# Patient Record
Sex: Male | Born: 1960 | Race: White | Hispanic: No | Marital: Married | Smoking: Former smoker
Health system: Southern US, Community
[De-identification: ages and names within clinical notes are randomized; demographics above are authoritative.]

## PROBLEM LIST (undated history)

## (undated) DIAGNOSIS — I255 Ischemic cardiomyopathy: Secondary | ICD-10-CM

## (undated) DIAGNOSIS — D696 Thrombocytopenia, unspecified: Secondary | ICD-10-CM

## (undated) DIAGNOSIS — E119 Type 2 diabetes mellitus without complications: Secondary | ICD-10-CM

## (undated) DIAGNOSIS — I219 Acute myocardial infarction, unspecified: Secondary | ICD-10-CM

## (undated) DIAGNOSIS — K760 Fatty (change of) liver, not elsewhere classified: Secondary | ICD-10-CM

## (undated) DIAGNOSIS — G4733 Obstructive sleep apnea (adult) (pediatric): Secondary | ICD-10-CM

## (undated) DIAGNOSIS — I10 Essential (primary) hypertension: Secondary | ICD-10-CM

## (undated) DIAGNOSIS — I2510 Atherosclerotic heart disease of native coronary artery without angina pectoris: Secondary | ICD-10-CM

## (undated) DIAGNOSIS — E785 Hyperlipidemia, unspecified: Secondary | ICD-10-CM

## (undated) HISTORY — PX: OTHER SURGICAL HISTORY: SHX169

## (undated) HISTORY — DX: Acute myocardial infarction, unspecified: I21.9

## (undated) HISTORY — PX: WISDOM TOOTH EXTRACTION: SHX21

## (undated) HISTORY — DX: Atherosclerotic heart disease of native coronary artery without angina pectoris: I25.10

## (undated) HISTORY — DX: Fatty (change of) liver, not elsewhere classified: K76.0

## (undated) HISTORY — DX: Obstructive sleep apnea (adult) (pediatric): G47.33

## (undated) HISTORY — DX: Hyperlipidemia, unspecified: E78.5

## (undated) HISTORY — PX: CORONARY ANGIOPLASTY WITH STENT PLACEMENT: SHX49

## (undated) HISTORY — PX: ROTATOR CUFF REPAIR: SHX139

---

## 1998-08-24 ENCOUNTER — Encounter: Payer: Self-pay | Admitting: Emergency Medicine

## 1998-08-24 ENCOUNTER — Emergency Department (HOSPITAL_COMMUNITY): Admission: EM | Admit: 1998-08-24 | Discharge: 1998-08-24 | Payer: Self-pay | Admitting: Emergency Medicine

## 2000-09-09 ENCOUNTER — Inpatient Hospital Stay (HOSPITAL_COMMUNITY): Admission: EM | Admit: 2000-09-09 | Discharge: 2000-09-13 | Payer: Self-pay | Admitting: Emergency Medicine

## 2000-09-09 ENCOUNTER — Encounter: Payer: Self-pay | Admitting: Emergency Medicine

## 2000-10-03 ENCOUNTER — Encounter (HOSPITAL_COMMUNITY): Admission: RE | Admit: 2000-10-03 | Discharge: 2000-12-21 | Payer: Self-pay | Admitting: *Deleted

## 2000-10-15 ENCOUNTER — Encounter: Payer: Self-pay | Admitting: Emergency Medicine

## 2000-10-15 ENCOUNTER — Inpatient Hospital Stay (HOSPITAL_COMMUNITY): Admission: EM | Admit: 2000-10-15 | Discharge: 2000-10-18 | Payer: Self-pay | Admitting: Emergency Medicine

## 2001-01-14 ENCOUNTER — Inpatient Hospital Stay (HOSPITAL_COMMUNITY): Admission: EM | Admit: 2001-01-14 | Discharge: 2001-01-18 | Payer: Self-pay | Admitting: Emergency Medicine

## 2001-01-14 ENCOUNTER — Encounter: Payer: Self-pay | Admitting: *Deleted

## 2001-05-08 ENCOUNTER — Ambulatory Visit (HOSPITAL_COMMUNITY): Admission: RE | Admit: 2001-05-08 | Discharge: 2001-05-08 | Payer: Self-pay | Admitting: *Deleted

## 2001-10-04 ENCOUNTER — Encounter: Payer: Self-pay | Admitting: *Deleted

## 2001-10-04 ENCOUNTER — Ambulatory Visit (HOSPITAL_COMMUNITY): Admission: RE | Admit: 2001-10-04 | Discharge: 2001-10-04 | Payer: Self-pay | Admitting: *Deleted

## 2002-01-10 ENCOUNTER — Encounter: Admission: RE | Admit: 2002-01-10 | Discharge: 2002-01-10 | Payer: Self-pay | Admitting: Gastroenterology

## 2002-01-10 ENCOUNTER — Encounter: Payer: Self-pay | Admitting: Gastroenterology

## 2002-02-13 ENCOUNTER — Inpatient Hospital Stay (HOSPITAL_COMMUNITY): Admission: AD | Admit: 2002-02-13 | Discharge: 2002-02-14 | Payer: Self-pay | Admitting: *Deleted

## 2002-02-13 ENCOUNTER — Encounter: Payer: Self-pay | Admitting: *Deleted

## 2003-02-03 ENCOUNTER — Inpatient Hospital Stay (HOSPITAL_COMMUNITY): Admission: AD | Admit: 2003-02-03 | Discharge: 2003-02-05 | Payer: Self-pay | Admitting: Internal Medicine

## 2003-02-04 ENCOUNTER — Encounter: Payer: Self-pay | Admitting: Internal Medicine

## 2003-03-20 ENCOUNTER — Emergency Department (HOSPITAL_COMMUNITY): Admission: EM | Admit: 2003-03-20 | Discharge: 2003-03-20 | Payer: Self-pay | Admitting: Emergency Medicine

## 2004-12-02 ENCOUNTER — Ambulatory Visit: Payer: Self-pay | Admitting: Internal Medicine

## 2004-12-06 ENCOUNTER — Ambulatory Visit: Payer: Self-pay

## 2004-12-14 ENCOUNTER — Ambulatory Visit: Payer: Self-pay | Admitting: *Deleted

## 2005-01-10 ENCOUNTER — Ambulatory Visit: Payer: Self-pay | Admitting: *Deleted

## 2005-03-07 ENCOUNTER — Ambulatory Visit (HOSPITAL_COMMUNITY): Admission: RE | Admit: 2005-03-07 | Discharge: 2005-03-07 | Payer: Self-pay | Admitting: Family Medicine

## 2005-03-17 ENCOUNTER — Ambulatory Visit: Payer: Self-pay | Admitting: Cardiology

## 2005-03-25 ENCOUNTER — Inpatient Hospital Stay (HOSPITAL_BASED_OUTPATIENT_CLINIC_OR_DEPARTMENT_OTHER): Admission: RE | Admit: 2005-03-25 | Discharge: 2005-03-25 | Payer: Self-pay | Admitting: *Deleted

## 2005-03-25 ENCOUNTER — Ambulatory Visit: Payer: Self-pay | Admitting: *Deleted

## 2005-03-25 ENCOUNTER — Observation Stay (HOSPITAL_COMMUNITY): Admission: AD | Admit: 2005-03-25 | Discharge: 2005-03-26 | Payer: Self-pay | Admitting: *Deleted

## 2005-04-01 ENCOUNTER — Ambulatory Visit: Payer: Self-pay | Admitting: Cardiology

## 2005-04-08 ENCOUNTER — Ambulatory Visit: Payer: Self-pay | Admitting: *Deleted

## 2005-05-02 ENCOUNTER — Ambulatory Visit (HOSPITAL_COMMUNITY): Admission: RE | Admit: 2005-05-02 | Discharge: 2005-05-02 | Payer: Self-pay | Admitting: Family Medicine

## 2005-08-05 ENCOUNTER — Ambulatory Visit: Payer: Self-pay | Admitting: Cardiology

## 2005-08-24 ENCOUNTER — Ambulatory Visit: Payer: Self-pay | Admitting: Cardiology

## 2005-08-30 ENCOUNTER — Ambulatory Visit: Payer: Self-pay

## 2005-11-24 ENCOUNTER — Ambulatory Visit: Payer: Self-pay | Admitting: Cardiology

## 2005-12-23 ENCOUNTER — Ambulatory Visit: Payer: Self-pay | Admitting: Internal Medicine

## 2006-01-27 ENCOUNTER — Ambulatory Visit (HOSPITAL_BASED_OUTPATIENT_CLINIC_OR_DEPARTMENT_OTHER): Admission: RE | Admit: 2006-01-27 | Discharge: 2006-01-27 | Payer: Self-pay | Admitting: Internal Medicine

## 2006-01-29 ENCOUNTER — Ambulatory Visit: Payer: Self-pay | Admitting: Internal Medicine

## 2006-02-02 ENCOUNTER — Ambulatory Visit: Payer: Self-pay | Admitting: Internal Medicine

## 2006-02-27 ENCOUNTER — Ambulatory Visit: Payer: Self-pay | Admitting: Cardiology

## 2006-03-03 ENCOUNTER — Ambulatory Visit: Payer: Self-pay | Admitting: Cardiology

## 2006-03-03 ENCOUNTER — Ambulatory Visit (HOSPITAL_COMMUNITY): Admission: RE | Admit: 2006-03-03 | Discharge: 2006-03-04 | Payer: Self-pay | Admitting: Cardiology

## 2006-03-03 ENCOUNTER — Encounter: Payer: Self-pay | Admitting: Internal Medicine

## 2006-03-16 ENCOUNTER — Ambulatory Visit: Payer: Self-pay | Admitting: Cardiology

## 2006-07-03 ENCOUNTER — Ambulatory Visit: Payer: Self-pay | Admitting: Cardiology

## 2007-01-31 ENCOUNTER — Ambulatory Visit: Payer: Self-pay | Admitting: Cardiology

## 2007-07-11 ENCOUNTER — Ambulatory Visit: Payer: Self-pay | Admitting: Cardiology

## 2007-07-11 LAB — CONVERTED CEMR LAB
BUN: 15 mg/dL (ref 6–23)
Basophils Absolute: 0 10*3/uL (ref 0.0–0.1)
Basophils Relative: 0.7 % (ref 0.0–1.0)
CO2: 30 meq/L (ref 19–32)
Calcium: 9.6 mg/dL (ref 8.4–10.5)
Chloride: 100 meq/L (ref 96–112)
Creatinine, Ser: 1.3 mg/dL (ref 0.4–1.5)
Eosinophils Absolute: 0.2 10*3/uL (ref 0.0–0.6)
Eosinophils Relative: 2.6 % (ref 0.0–5.0)
GFR calc Af Amer: 77 mL/min
GFR calc non Af Amer: 64 mL/min
Glucose, Bld: 91 mg/dL (ref 70–99)
HCT: 41.9 % (ref 39.0–52.0)
Hemoglobin: 15.1 g/dL (ref 13.0–17.0)
INR: 0.9 (ref 0.8–1.0)
Lymphocytes Relative: 30.3 % (ref 12.0–46.0)
MCHC: 36 g/dL (ref 30.0–36.0)
MCV: 89.2 fL (ref 78.0–100.0)
Monocytes Absolute: 0.5 10*3/uL (ref 0.2–0.7)
Monocytes Relative: 7.2 % (ref 3.0–11.0)
Neutro Abs: 4.2 10*3/uL (ref 1.4–7.7)
Neutrophils Relative %: 59.2 % (ref 43.0–77.0)
Platelets: 190 10*3/uL (ref 150–400)
Potassium: 3.6 meq/L (ref 3.5–5.1)
Prothrombin Time: 11.4 s (ref 10.9–13.3)
RBC: 4.7 M/uL (ref 4.22–5.81)
RDW: 13.3 % (ref 11.5–14.6)
Sodium: 140 meq/L (ref 135–145)
WBC: 7 10*3/uL (ref 4.5–10.5)
aPTT: 24.9 s (ref 21.7–29.8)

## 2007-07-20 ENCOUNTER — Ambulatory Visit: Payer: Self-pay | Admitting: Cardiovascular Disease

## 2007-07-20 ENCOUNTER — Inpatient Hospital Stay (HOSPITAL_BASED_OUTPATIENT_CLINIC_OR_DEPARTMENT_OTHER): Admission: RE | Admit: 2007-07-20 | Discharge: 2007-07-20 | Payer: Self-pay | Admitting: Cardiovascular Disease

## 2007-07-20 ENCOUNTER — Inpatient Hospital Stay (HOSPITAL_COMMUNITY): Admission: AD | Admit: 2007-07-20 | Discharge: 2007-07-21 | Payer: Self-pay | Admitting: Cardiovascular Disease

## 2007-07-20 ENCOUNTER — Ambulatory Visit: Payer: Self-pay | Admitting: Cardiology

## 2007-08-03 ENCOUNTER — Ambulatory Visit: Payer: Self-pay | Admitting: Cardiology

## 2007-08-03 LAB — CONVERTED CEMR LAB
ALT: 33 units/L (ref 0–53)
AST: 24 units/L (ref 0–37)
Albumin: 4.3 g/dL (ref 3.5–5.2)
Alkaline Phosphatase: 54 units/L (ref 39–117)
Bilirubin, Direct: 0.1 mg/dL (ref 0.0–0.3)
Cholesterol: 162 mg/dL (ref 0–200)
Direct LDL: 105.6 mg/dL
HDL: 26.5 mg/dL — ABNORMAL LOW (ref >39.0)
Total Bilirubin: 1 mg/dL (ref 0.3–1.2)
Total CHOL/HDL Ratio: 6.1
Total Protein: 7.4 g/dL (ref 6.0–8.3)
Triglycerides: 234 mg/dL (ref 0–149)
VLDL: 47 mg/dL — ABNORMAL HIGH (ref 0–40)

## 2007-08-23 ENCOUNTER — Ambulatory Visit (HOSPITAL_COMMUNITY): Admission: RE | Admit: 2007-08-23 | Discharge: 2007-08-24 | Payer: Self-pay | Admitting: Orthopedic Surgery

## 2007-10-22 ENCOUNTER — Ambulatory Visit: Payer: Self-pay | Admitting: Cardiology

## 2007-10-22 LAB — CONVERTED CEMR LAB
ALT: 45 units/L (ref 0–53)
AST: 21 units/L (ref 0–37)
Albumin: 4 g/dL (ref 3.5–5.2)
Alkaline Phosphatase: 60 units/L (ref 39–117)
Bilirubin, Direct: 0.1 mg/dL (ref 0.0–0.3)
Cholesterol: 239 mg/dL (ref 0–200)
Direct LDL: 95.5 mg/dL
HDL: 26.3 mg/dL — ABNORMAL LOW (ref >39.0)
Total Bilirubin: 0.8 mg/dL (ref 0.3–1.2)
Total CHOL/HDL Ratio: 9.1
Total Protein: 6.9 g/dL (ref 6.0–8.3)
Triglycerides: 838 mg/dL (ref 0–149)
VLDL: 168 mg/dL — ABNORMAL HIGH (ref 0–40)

## 2007-10-23 ENCOUNTER — Ambulatory Visit: Payer: Self-pay | Admitting: Cardiology

## 2008-05-19 ENCOUNTER — Ambulatory Visit: Payer: Self-pay | Admitting: Cardiology

## 2008-12-03 ENCOUNTER — Ambulatory Visit: Payer: Self-pay | Admitting: Cardiology

## 2008-12-08 ENCOUNTER — Ambulatory Visit (HOSPITAL_COMMUNITY): Admission: RE | Admit: 2008-12-08 | Discharge: 2008-12-08 | Payer: Self-pay | Admitting: Internal Medicine

## 2009-01-08 ENCOUNTER — Ambulatory Visit (HOSPITAL_COMMUNITY): Admission: RE | Admit: 2009-01-08 | Discharge: 2009-01-08 | Payer: Self-pay | Admitting: Urology

## 2009-04-27 ENCOUNTER — Encounter (INDEPENDENT_AMBULATORY_CARE_PROVIDER_SITE_OTHER): Payer: Self-pay | Admitting: *Deleted

## 2009-04-27 LAB — CONVERTED CEMR LAB
ALT: 37 units/L
AST: 23 units/L
Albumin: 4.2 g/dL
Alkaline Phosphatase: 49 units/L
Bilirubin, Direct: <0.1 mg/dL
Cholesterol: 153 mg/dL
HDL: 37 mg/dL
LDL Cholesterol: 82 mg/dL
Total Protein: 6.6 g/dL
Triglycerides: 171 mg/dL

## 2009-04-30 ENCOUNTER — Encounter: Payer: Self-pay | Admitting: Cardiology

## 2009-08-03 ENCOUNTER — Encounter: Payer: Self-pay | Admitting: Cardiology

## 2009-08-25 ENCOUNTER — Telehealth (INDEPENDENT_AMBULATORY_CARE_PROVIDER_SITE_OTHER): Payer: Self-pay | Admitting: *Deleted

## 2009-08-31 DIAGNOSIS — E785 Hyperlipidemia, unspecified: Secondary | ICD-10-CM | POA: Insufficient documentation

## 2009-09-01 ENCOUNTER — Ambulatory Visit: Payer: Self-pay | Admitting: Cardiology

## 2009-09-01 DIAGNOSIS — I209 Angina pectoris, unspecified: Secondary | ICD-10-CM | POA: Insufficient documentation

## 2009-09-01 DIAGNOSIS — I2510 Atherosclerotic heart disease of native coronary artery without angina pectoris: Secondary | ICD-10-CM | POA: Insufficient documentation

## 2009-09-22 ENCOUNTER — Encounter (INDEPENDENT_AMBULATORY_CARE_PROVIDER_SITE_OTHER): Payer: Self-pay | Admitting: *Deleted

## 2009-09-22 ENCOUNTER — Ambulatory Visit: Payer: Self-pay | Admitting: Cardiology

## 2009-09-22 LAB — CONVERTED CEMR LAB
ALT: 37 units/L
AST: 23 units/L
Albumin: 4.2 g/dL
Alkaline Phosphatase: 49 units/L
Bilirubin, Direct: <0.1 mg/dL
Cholesterol: 153 mg/dL
HDL: 37 mg/dL
LDL Cholesterol: 82 mg/dL
Total Protein: 6.6 g/dL
Triglycerides: 171 mg/dL

## 2009-10-27 ENCOUNTER — Telehealth (INDEPENDENT_AMBULATORY_CARE_PROVIDER_SITE_OTHER): Payer: Self-pay

## 2009-12-21 ENCOUNTER — Encounter (INDEPENDENT_AMBULATORY_CARE_PROVIDER_SITE_OTHER): Payer: Self-pay | Admitting: *Deleted

## 2009-12-31 ENCOUNTER — Ambulatory Visit: Payer: Self-pay | Admitting: Cardiology

## 2010-01-29 ENCOUNTER — Ambulatory Visit: Payer: Self-pay | Admitting: Cardiology

## 2010-01-29 ENCOUNTER — Telehealth (INDEPENDENT_AMBULATORY_CARE_PROVIDER_SITE_OTHER): Payer: Self-pay

## 2010-01-29 ENCOUNTER — Encounter (INDEPENDENT_AMBULATORY_CARE_PROVIDER_SITE_OTHER): Payer: Self-pay | Admitting: *Deleted

## 2010-01-29 DIAGNOSIS — I10 Essential (primary) hypertension: Secondary | ICD-10-CM | POA: Insufficient documentation

## 2010-02-01 ENCOUNTER — Ambulatory Visit: Payer: Self-pay | Admitting: Cardiovascular Disease

## 2010-02-01 ENCOUNTER — Inpatient Hospital Stay (HOSPITAL_BASED_OUTPATIENT_CLINIC_OR_DEPARTMENT_OTHER): Admission: RE | Admit: 2010-02-01 | Discharge: 2010-02-01 | Payer: Self-pay | Admitting: Cardiovascular Disease

## 2010-02-01 ENCOUNTER — Inpatient Hospital Stay (HOSPITAL_COMMUNITY): Admission: AD | Admit: 2010-02-01 | Discharge: 2010-02-03 | Payer: Self-pay | Admitting: Cardiovascular Disease

## 2010-02-03 ENCOUNTER — Telehealth: Payer: Self-pay | Admitting: Cardiovascular Disease

## 2010-02-24 ENCOUNTER — Encounter (INDEPENDENT_AMBULATORY_CARE_PROVIDER_SITE_OTHER): Payer: Self-pay | Admitting: *Deleted

## 2010-02-24 ENCOUNTER — Ambulatory Visit: Payer: Self-pay | Admitting: Cardiology

## 2010-02-24 LAB — CONVERTED CEMR LAB
BUN: 17 mg/dL
BUN: 17 mg/dL (ref 6–23)
CO2: 30 meq/L
CO2: 30 meq/L (ref 19–32)
Calcium: 9.4 mg/dL
Calcium: 9.4 mg/dL (ref 8.4–10.5)
Chloride: 99 meq/L
Chloride: 99 meq/L (ref 96–112)
Creatinine, Ser: 1.03 mg/dL
Creatinine, Ser: 1.03 mg/dL (ref 0.40–1.50)
Glucose, Bld: 79 mg/dL
Glucose, Bld: 79 mg/dL (ref 70–99)
Potassium: 3.8 meq/L
Potassium: 3.8 meq/L (ref 3.5–5.3)
Sodium: 135 meq/L
Sodium: 135 meq/L (ref 135–145)

## 2010-03-09 ENCOUNTER — Telehealth (INDEPENDENT_AMBULATORY_CARE_PROVIDER_SITE_OTHER): Payer: Self-pay

## 2010-04-21 ENCOUNTER — Encounter (INDEPENDENT_AMBULATORY_CARE_PROVIDER_SITE_OTHER): Payer: Self-pay | Admitting: *Deleted

## 2010-04-28 ENCOUNTER — Encounter (INDEPENDENT_AMBULATORY_CARE_PROVIDER_SITE_OTHER): Payer: Self-pay | Admitting: *Deleted

## 2010-04-28 ENCOUNTER — Telehealth (INDEPENDENT_AMBULATORY_CARE_PROVIDER_SITE_OTHER): Payer: Self-pay

## 2010-04-30 ENCOUNTER — Telehealth (INDEPENDENT_AMBULATORY_CARE_PROVIDER_SITE_OTHER): Payer: Self-pay

## 2010-05-07 ENCOUNTER — Encounter: Payer: Self-pay | Admitting: Cardiology

## 2010-05-07 ENCOUNTER — Ambulatory Visit: Payer: Self-pay | Admitting: Cardiology

## 2010-05-07 ENCOUNTER — Ambulatory Visit (HOSPITAL_COMMUNITY): Admission: RE | Admit: 2010-05-07 | Discharge: 2010-05-07 | Payer: Self-pay | Admitting: Cardiology

## 2010-05-17 ENCOUNTER — Encounter: Payer: Self-pay | Admitting: Cardiology

## 2010-06-03 ENCOUNTER — Encounter: Payer: Self-pay | Admitting: Cardiology

## 2010-07-15 ENCOUNTER — Encounter (INDEPENDENT_AMBULATORY_CARE_PROVIDER_SITE_OTHER): Payer: Self-pay

## 2010-07-15 LAB — CONVERTED CEMR LAB
Cholesterol: 149 mg/dL
HDL: 42 mg/dL
LDL Cholesterol: 83 mg/dL
Triglycerides: 120 mg/dL

## 2010-07-20 ENCOUNTER — Telehealth (INDEPENDENT_AMBULATORY_CARE_PROVIDER_SITE_OTHER): Payer: Self-pay | Admitting: *Deleted

## 2010-08-13 ENCOUNTER — Encounter (INDEPENDENT_AMBULATORY_CARE_PROVIDER_SITE_OTHER): Payer: Self-pay

## 2010-08-13 ENCOUNTER — Telehealth (INDEPENDENT_AMBULATORY_CARE_PROVIDER_SITE_OTHER): Payer: Self-pay

## 2010-08-13 ENCOUNTER — Ambulatory Visit: Payer: Self-pay | Admitting: Cardiology

## 2010-12-12 ENCOUNTER — Encounter: Payer: Self-pay | Admitting: Family Medicine

## 2010-12-23 NOTE — Assessment & Plan Note (Signed)
Summary: PT HAVING SOME CHEST PAIN/PER TAMMY/TG   Visit Type:  Follow-up Primary Provider:  Dr. [name]   History of Present Illness: 50 year old gentleman, last seen in the office in January. He presents for a routine followup, although reports a 4-6 week history of exertional angina and shortness of breath. He states that he was able to lose weight, down to 215 pounds with diet started earlier in the year, although has gained back up to 251 pounds. He continues to go to the gym, and with relatively low level exercise, does not have angina. Walking up a flight of steps quickly, however consistently brings on chest pain. Symptoms last for usually only a few minutes, and never at rest. He has not had use any sublingual nitroglycerin.  He and his wife went on a seven-day cruise to the [place] recently, and he was experiencing angina during this trip.  He has otherwise been compliant with his medications. He has had prior problems with an intolerance to long-acting nitrates related to headache.  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 Following drug-eluting stent placement back in 2008, he has done reasonably well to this point.  Current Medications (verified): 1)  Crestor 20 Mg Tabs (Rosuvastatin Calcium) .... Take 1 Tab At Bedtime 2)  Nexium 40 Mg Cpdr (Esomeprazole Magnesium) 3)  Plavix 75 Mg Tabs (Clopidogrel Bisulfate) 4)  Toprol Xl 50 Mg Xr24h-Tab (Metoprolol Succinate) .... Take 1 and 1/2 Tablets (75mg ) By Mouth Two Times A Day 5)  Aspirin 325 Mg Tabs (Aspirin) .... Take 1 Tab Daily 6)  Norvasc 2.5 Mg Tabs (Amlodipine Besylate) .... Take 1 Tablet By Mouth Once Daily 7)  Nitrostat 0.4 Mg Subl (Nitroglycerin) .[name].. 1 Tablet Under Tongue At Onset of Chest Pain; You May Repeat Every 5 Minutes For Up To 3 Doses. 8)  Diovan Hct 320-25 Mg Tabs  (Valsartan-Hydrochlorothiazide) .... Take 1 Tablet By Mouth Once Daily  Allergies (verified): No Known Drug Allergies  Past History:  Social History: Last updated: 08/31/2009 Married  Tobacco Use - No.  Alcohol Use - no Regular Exercise - no Drug Use - no  Past Medical History: CAD - remote stent LAD, subsequent stents RCA with restenosis, DES to RCA and circ 06/2007 in setting of restenosis Hyperlipidemia Hypertension Hepatic steatosis Obstructive sleep apnea Myocardial Infarction - anterior  Past Surgical History: Unremarkable  Family History: Family History of Coronary Artery Disease  Review of Systems       The patient complains of weight gain.  The patient denies anorexia, fever, hoarseness, syncope, peripheral edema, prolonged cough, hemoptysis, abdominal pain, melena, and hematochezia.         Otherwise reviewed and negative.  Vital Signs:  Patient profile:   50 year old male Height:      73 inches Weight:      251 pounds BMI:     33.24 Pulse rate:   84 / minute BP sitting:   133 / 82  (right arm)  Vitals Entered By: Dreama Saa, CNA (September 01, 2009 3:37 PM)  Physical Exam  Additional Exam:  Morbidly obese male in no acute distress, no active chest pain. HEENT: Conjunctiva and lids normal, oropharynx clear. Neck: Supple, no elevated jugular venous pressure. Lungs: Clear to auscultation, nonlabored. Cardiac: Regular rate and rhythm, no S3 gallop, no loud systolic murmur. PMI indistinct. Abdomen: Obese, cannot palpate liver edge, bowel sounds present, nontender. Skin: Warm and dry.  Extremities: No pitting edema, distal pulses one plus. Musculoskeletal: No kyphosis. Neuropsychiatric: Alert and oriented x3, affect appropriate   EKG  Procedure date:  09/01/2009  Findings:      Normal sinus rhythm at 77 beats per minute. No acute ST changes.  Impression & Recommendations:  Problem # 1:  ANGINA, STABLE (ICD-413.9)  [name] is describing  exertional angina, onset over the last 4-6 weeks, although stable in pattern and intensity. Based on his history of recurrent restenosis, predominantly affecting the right coronary artery, I am certainly suspicious about either restenosis versus progressive atherosclerosis. On the other hand, he has gained a significant amount of weight which may be contributing to increased myocardial demand. He reports compliance with his medications otherwise. I reviewed the matter with him, and we discussed a repeat cardiac catheterization, following an initial attempt at advancing antianginal therapy. I will add Norvasc 2.5 mg daily to his present regimen. We discussed weight loss. I plan to see him back over the next 3 weeks to check on his clinical progress. If his symptoms progress, a cardiac catheterization will be scheduled to best understand his coronary anatomy, and potential for revascularization options.  His updated medication list for this problem includes:    Plavix 75 Mg Tabs (Clopidogrel bisulfate)    Toprol Xl 50 Mg Xr24h-tab (Metoprolol succinate) .[name]... Take 1 and 1/2 tablets (75mg ) by mouth two times a day    Aspirin 325 Mg Tabs (Aspirin) .[name]... Take 1 tab daily    Norvasc 2.5 Mg Tabs (Amlodipine besylate) .[name]... Take 1 tablet by mouth once daily    Nitrostat 0.4 Mg Subl (Nitroglycerin) .[name]... 1 tablet under tongue at onset of chest pain; you may repeat every 5 minutes for up to 3 doses.  His updated medication list for this problem includes:    Plavix 75 Mg Tabs (Clopidogrel bisulfate)    Toprol Xl 50 Mg Xr24h-tab (Metoprolol succinate) .[name]... Take 1 and 1/2 tablets (75mg ) by mouth two times a day    Aspirin 325 Mg Tabs (Aspirin) .[name]... Take 1 tab daily    Norvasc 2.5 Mg Tabs (Amlodipine besylate) .[name]... Take 1 tablet by mouth once daily    Nitrostat 0.4 Mg Subl (Nitroglycerin) .[name]... 1 tablet under tongue at onset of chest pain; you may repeat every 5 minutes for up to 3 doses.  Problem # 2:   CORONARY ATHEROSCLEROSIS NATIVE CORONARY ARTERY (ICD-414.01)  Status post multiple percutaneous coronary interventions, mostly related to restenosis within the right coronary artery distribution. Drug-eluting stents were placed in the right coronary artery and circumflex back in 2008. If he continues to manifest restenosis, particularly in the setting of advancing atherosclerosis elsewhere, bypass surgery may well be his best option for a more durable revascularization over time.  His updated medication list for this problem includes:    Plavix 75 Mg Tabs (Clopidogrel bisulfate)    Toprol Xl 50 Mg Xr24h-tab (Metoprolol succinate) .[name]... Take 1 and 1/2 tablets (75mg ) by mouth two times a day    Aspirin 325 Mg Tabs (Aspirin) .[name]... Take 1 tab daily    Norvasc 2.5 Mg Tabs (Amlodipine besylate) .[name]... Take 1 tablet by mouth once daily    Nitrostat 0.4 Mg Subl (Nitroglycerin) .[name]... 1 tablet under tongue at onset of chest pain; you may repeat every 5 minutes for up to 3 doses.  Problem # 3:  DYSLIPIDEMIA (ICD-272.4)  LDL was fairly well controlled at 82 by last followup in June, on present dose of Crestor.  His  updated medication list for this problem includes:    Crestor 20 Mg Tabs (Rosuvastatin calcium) .[name]... Take 1 tab at bedtime  Patient Instructions: 1)  Your physician recommends that you schedule a follow-up appointment in: 3 weeks 2)  Your physician has recommended you make the following change in your medication: Start taking Norvasc 2.5mg  by mouth once daily Prescriptions: NITROSTAT 0.4 MG SUBL (NITROGLYCERIN) 1 tablet under tongue at onset of chest pain; you may repeat every 5 minutes for up to 3 doses.  #25 x 3   Entered by:   Larita Fife Via LPN   Authorized by:   Loreli Slot, MD, [facility]   Signed by:   Larita Fife Via LPN on 19/14/7829   Method used:   Electronically to        [facility] [phone]* (retail)       [address] [place], [place]  [address]       Ph:  [phone]       Fax: [phone]   RxID:   [id number] NORVASC 2.5 MG TABS (AMLODIPINE BESYLATE) Take 1 tablet by mouth once daily  #30 x 6   Entered by:   Larita Fife Via LPN   Authorized by:   Loreli Slot, MD, [facility]   Signed by:   Larita Fife Via LPN on 56/38/7564   Method used:   Electronically to        [facility] [phone]* (retail)       [address], [place]  [address]       Ph: [phone]       Fax: [phone]   RxID:   [id number]

## 2010-12-23 NOTE — Miscellaneous (Signed)
Summary: LABS LIPIDS,LIVER,04/27/2009  Clinical Lists Changes  Observations: Added new observation of ALBUMIN: 4.2 g/dL (29/56/2130 86:57) Added new observation of PROTEIN, TOT: 6.6 g/dL (84/69/6295 28:41) Added new observation of SGPT (ALT): 37 units/L (04/27/2009 15:04) Added new observation of SGOT (AST): 23 units/L (04/27/2009 15:04) Added new observation of ALK PHOS: 49 units/L (04/27/2009 15:04) Added new observation of BILI DIRECT: <0.1 mg/dL (32/44/0102 72:53) Added new observation of LDL: 82 mg/dL (66/44/0347 42:59) Added new observation of HDL: 37 mg/dL (56/38/7564 33:29) Added new observation of TRIGLYC TOT: 171 mg/dL (51/88/4166 06:30) Added new observation of CHOLESTEROL: 153 mg/dL (16/11/930 35:57)

## 2010-12-23 NOTE — Progress Notes (Signed)
**Note De-Identified [name] Obfuscation** Summary: Chest Pain  Phone Note Call from Patient   Caller: Patient Reason for Call: Talk to Nurse Summary of Call: pt states that he has been having chest pain/it is becoming more frequent and intense/has not had to take NTG yet though/states that Dr.McDowell told him when this happens to let him know/tg Initial call taken by: Raechel Ache [facility],  January 29, 2010 8:40 AM  Follow-up for Phone Call        Pt. states that since he and his wife have separated he has been having more CP but that it only lasts from 30secs to 1 min. Pain is extreme but does not radiate , no sob, nausea or sweating. Pt. advised to go to ER if pain persist or worsens.  Follow-up by: Larita Fife Amye Grego LPN,  January 29, 2010 9:13 AM  Additional Follow-up for Phone Call Additional follow up Details #1::        We have already taked about a heart catheterization. If his symptoms are progessing, he may be ready to consider this. I can see him when I am back in office - although he should present sooner if needed. Additional Follow-up by: Loreli Slot, MD, [facility],  January 29, 2010 9:35 AM    Additional Follow-up for Phone Call Additional follow up Details #2::    Patient states that he is ready for cath. You will not be in this office until the 23rd. Aurther Loft says there is an opening in [place] today @ 2:30, if that is the case would you like to see him today or can this wait?  Additional Follow-up for Phone Call Additional follow up Details #3:: Details for Additional Follow-up Action Taken: Yes - I can see him today, but would prefer he comes in sooner rather than later.  Morning is less busy.  Can set up catheterization for next week. Additional Follow-up by: Loreli Slot, MD, [facility],  January 29, 2010 10:12 AM  Spoke with Judeth Cornfield in [place] office. She left message for [facility] regarding what time to put this patient on for/tg   Raechel Ache [facility]  January 29, 2010 11:11 AM    Spoke with Antony Contras, pt to be in [place] @ 1:30 for  appt. w/Dr.McDowell. Pt aware/tg Raechel Ache [facility]  January 29, 2010 12:26 PM

## 2010-12-23 NOTE — Letter (Signed)
Summary: Cardiac Cath Instructions - [facility]  [facility] [address]   [place], [place] [address]   Phone: [phone]  Fax: [phone]     01/29/2010 MRN: [id number]  [name] [address] [place], [place]  [address]  Dear Mr. Noseworthy,   You are scheduled for a Cardiac Catheterization on Monday, March 14th with Dr. Excell Seltzer.  Please arrive to the 1st floor of the Heart and Vascular Center at [facility] at 11:30 pm on the day of your procedure. Please do not arrive before 6:30 a.m. Call the Heart and Vascular Center at [phone] if you are unable to make your appointmnet. The Code to get into the parking garage under the building is 9000. Take the elevators to the 1st floor. You must have someone to drive you home. Someone must be with you for the first 24 hours after you arrive home. Please wear clothes that are easy to get on and off and wear slip-on shoes. Do not eat or drink after midnight except water with your medications that morning. Bring all your medications and current insurance cards with you.  __X_ DO NOT take these medications before your procedure: Diovan/HCTZ (am of cath)  _X__ Make sure you take your aspirin and Plavix.  _X__ You may take all of your remaining medications with water that morning.  ___ DO NOT take ANY medications before your procedure.  ___ Pre-med instructions:  ________________________________________________________________________  The usual length of stay after your procedure is 2 to 3 hours. This can vary.  If you have any questions, please call the office at the number listed above.  Cyril Loosen, RN, BSN          Directions to the [facility]  Please Note : Park in [place] under the building not the parking deck.  From [employer]: Turn onto [address] Left onto [place] (1st stoplight) Right at the brick entrance to the hospital  (Main circle drive) Bear to the right and you will see a blue sign "Heart and Vascular Center" Parking garage is a sharp right'to get through the gate out in the code _9000______. Once you park, take the elevator to the first floor. Please do not arrive before 0630am. The building will be dark before that time.   From [address] Turn onto [address] Turn left into the brick entrance to the hospital (Main circle drive) Bear to the right and you will see a blue sign "Heart and Vascular Center" Parking garage is a sharp right, to get thru the gate put in the code _9000___. Once you park, take the elevator to the first floor. Please do not arrive before 0630am. The building will be dark before that time

## 2010-12-23 NOTE — Miscellaneous (Signed)
  Clinical Lists Changes  Medications: Added new medication of TOPROL XL 50 MG XR24H-TAB (METOPROLOL SUCCINATE) Take 1 and 1/2 tablets (75mg ) by mouth two times a day - Signed Removed medication of METOPROLOL TARTRATE 25 MG TABS (METOPROLOL TARTRATE) Take 3 tablet by mouth twice a day Rx of TOPROL XL 50 MG XR24H-TAB (METOPROLOL SUCCINATE) Take 1 and 1/2 tablets (75mg ) by mouth two times a day;  #90 x 6;  Signed;  Entered by: Larita Fife Via LPN;  Authorized by: Loreli Slot, MD, [facility];  Method used: [facility]  [id number], [place], [place], [place]  [address], Ph: [phone] or [phone], Fax: [phone]    Prescriptions: TOPROL XL 50 MG XR24H-TAB (METOPROLOL SUCCINATE) Take 1 and 1/2 tablets (75mg ) by mouth two times a day  #90 x 6   Entered by:   Larita Fife Via LPN   Authorized by:   Loreli Slot, MD, [facility]   Signed by:   Larita Fife Via LPN on 84/13/2440   Method used:   [facility]ronically to        [facility]  [id number] (retail)       [address]       [place], [place]  [address]       Ph: [phone] or [phone]       Fax: [phone]   RxID:   [id number]

## 2010-12-23 NOTE — Medication Information (Signed)
Summary: RX Folder/ [facility]  RX Folder/ [facility]   Imported By: Dorise Hiss 06/03/2010 12:32:22  _____________________________________________________________________  External Attachment:    Type:   Image     Comment:   External Document

## 2010-12-23 NOTE — Progress Notes (Signed)
**Note De-Identified Witt Plitt Obfuscation** Summary: missed appt./Echo  Phone Note Outgoing Call   Call placed by: Larita Fife Aarit Kashuba LPN,  April 28, 1609 4:07 PM Summary of Call: Pt. states he forgot appt. today and that he had to cancel his Echo (scheduled for 04-20-10) due to his work schedule but is going to call back to reschedule the Echo and an OV when he has more time. Also, he states that he is feeling better now than he has in a while.   Follow-up for Phone Call        Reviewed.  Will await echocardiogram and see him at rescheduled visit. Follow-up by: Loreli Slot, MD, [facility],  April 28, 2010 6:19 PM

## 2010-12-23 NOTE — Progress Notes (Signed)
Summary: Results of Labwork  Phone Note Call from Patient Call back at [phone]   Caller: Patient Summary of Call: Patient called back to give results of bloodwork / they are as followed: Triglycerides - 120 Total Cholestrol - 149 HDL - 42 LDL - 83 Cholestrol HDLC Ratio - 3.5  If you need anymore results pls call patient @ above number/tg Initial call taken by: Raechel Ache [facility],  August 13, 2010 3:08 PM  Follow-up for Phone Call        Lab results put in flowsheet. Follow-up by: Larita Fife Via LPN,  August 13, 2010 4:15 PM     Appended Document: Results of Labwork OK. Continue present regimen.

## 2010-12-23 NOTE — Letter (Signed)
Summary: External Correspondence/ FAXED PRE-CATH ORDER  External Correspondence/ FAXED PRE-CATH ORDER   Imported By: Dorise Hiss 02/09/2010 12:12:59  _____________________________________________________________________  External Attachment:    Type:   Image     Comment:   External Document

## 2010-12-23 NOTE — Progress Notes (Signed)
Summary: Refill  Phone Note Call from Patient   Caller: Patient Reason for Call: Refill Medication Summary of Call: pt needs refill on Nexium / states he was told that he needs appt but he is not due until 11/10for 6 mth f/u/tg Initial call taken by: Raechel Ache [place],  July 20, 2010 4:22 PM    Prescriptions: NEXIUM 40 MG CPDR (ESOMEPRAZOLE MAGNESIUM) take 1 tab daily  #30 x 0   Entered by:   Teressa Lower RN   Authorized by:   Loreli Slot, MD, [facility]   Signed by:   Teressa Lower RN on 07/20/2010   Method used:   [facility]        [facility]  [address]retail)       [address]       [place], [place]  [address]       Ph: [phone] or [phone]       Fax: [phone]   RxID:   [id number]

## 2010-12-23 NOTE — Miscellaneous (Signed)
Summary: LABS BMP 02/24/2010  Clinical Lists Changes  Observations: Added new observation of CALCIUM: 9.4 mg/dL (60/45/4098 1:19) Added new observation of CREATININE: 1.03 mg/dL (14/78/2956 2:13) Added new observation of BUN: 17 mg/dL (08/65/7846 9:62) Added new observation of BG RANDOM: 79 mg/dL (95/28/4132 4:40) Added new observation of CO2 PLSM/SER: 30 meq/L (02/24/2010 9:52) Added new observation of CL SERUM: 99 meq/L (02/24/2010 9:52) Added new observation of K SERUM: 3.8 meq/L (02/24/2010 9:52) Added new observation of NA: 135 meq/L (02/24/2010 9:52)

## 2010-12-23 NOTE — Assessment & Plan Note (Signed)
Summary: past due for f/u/tg   Visit Type:  Follow-up Primary Provider:  Dr. Elfredia Nevins   History of Present Illness: 50 year old male presents for followup. I saw him back in April. He was referred for a followup echocardiogram to reassess LVEF, reviewed below.  He denies any significant problems with recurrent angina or increasing shortness of breath since his visit. No changes in medical therapy. He is transitioning his primary care followup with Dr. Sherwood Gambler since Dr.Cresenzo retired.  He reports lab work done through a health assessment at his job approximately 6 weeks ago. Plans to forward results to [place] for review. He states his lipids look good.  Current Medications (verified): 1)  Crestor 20 Mg Tabs (Rosuvastatin Calcium) .... Take 1 Tab At Bedtime 2)  Nexium 40 Mg Cpdr (Esomeprazole Magnesium) .... Take 1 Tab Daily 3)  Effient 10 Mg Tabs (Prasugrel Hcl) .... Take One Tablet By Mouth Daily 4)  Toprol Xl 50 Mg Xr24h-Tab (Metoprolol Succinate) .... Take 1 and 1/2 Tablets (75mg ) By Mouth Two Times A Day 5)  Aspirin 325 Mg Tabs (Aspirin) .... Take 1 Tab Daily 6)  Norvasc 2.5 Mg Tabs (Amlodipine Besylate) .... Take 1 Tablet By Mouth Once Daily 7)  Nitrostat 0.4 Mg Subl (Nitroglycerin) .[name].. 1 Tablet Under Tongue At Onset of Chest Pain; You May Repeat Every 5 Minutes For Up To 3 Doses. 8)  Diovan Hct 320-25 Mg Tabs (Valsartan-Hydrochlorothiazide) .... Take 1 Tablet By Mouth Once Daily 9)  Daily Multi  Tabs (Multiple Vitamins-Minerals) .... Take 1 Tab Daily 10)  Fish Oil 1000 Mg Caps (Omega-3 Fatty Acids) .... 2 Caps Two Times A Day  Allergies (verified): No Known Drug Allergies  Past History:  Social History: Last updated: 08/31/2009 Married  Tobacco Use - No.  Alcohol Use - no Regular Exercise - no Drug Use - no  Past Medical History: CAD - remote stent LAD, subsequent stents RCA with restenosis, DES to RCA and circ 06/2007 in setting of restenosis, DES LAD and DES RCA  3/11 Hyperlipidemia Hypertension Hepatic steatosis Obstructive sleep apnea Myocardial Infarction - anterior  Review of Systems  The patient denies anorexia, fever, weight gain, chest pain, syncope, dyspnea on exertion, peripheral edema, melena, and hematochezia.         Otherwise reviewed and negative.  Vital Signs:  Patient profile:   50 year old male Weight:      232 pounds BMI:     30.72 Pulse rate:   80 / minute BP sitting:   124 / 76  (right arm)  Vitals Entered By: Dreama Saa, CNA (August 13, 2010 12:56 PM)  Physical Exam  Additional Exam:  No acute distress, no active chest pain. HEENT: Conjunctiva and lids normal, oropharynx clear. Neck: Supple, no elevated jugular venous pressure. Lungs: Clear to auscultation, nonlabored. Cardiac: Regular rate and rhythm, no S3 gallop, no loud systolic murmur. PMI indistinct. Skin: Warm and dry. Extremities: No pitting edema, distal pulses one plus, no groin hematoma. Musculoskeletal: No gross deformities. Neuropsychiatric: Alert and oriented x3, affect appropriate.    Echocardiogram  Procedure date:  05/07/2010  Findings:      Study Conclusions            - Left ventricle: The cavity size was normal. Wall thickness was       increased in a pattern of mild LVH. Systolic function was low       normal. The estimated ejection fraction was 50%. Akinesis and  scarring of the mid-distal anteroseptal myocardium; consistent       with infarction. Moderate hypokinesis of the apical myocardium.     - Atrial septum: No defect or patent foramen ovale was identified.  EKG  Procedure date:  08/13/2010  Findings:      Sinus rhythm at 73 beats per minute, decreased anterior R-wave progression consistent with old infarct.  Impression & Recommendations:  Problem # 1:  CORONARY ATHEROSCLEROSIS NATIVE CORONARY ARTERY (ICD-414.01)  Symptomatically stable on present medical regimen. Followup echocardiogram shows improved  LVEF to the low-normal range, approximately 50%, with mid to distal anteroseptal scar consistent with infarct. No changes made to medications today. Followup scheduled for 6 months. I spoke with him about diet and exercise in the interim.  His updated medication list for this problem includes:    Effient 10 Mg Tabs (Prasugrel hcl) .[name]... Take one tablet by mouth daily    Toprol Xl 50 Mg Xr24h-tab (Metoprolol succinate) .[name]... Take 1 and 1/2 tablets (75mg ) by mouth two times a day    Aspirin 325 Mg Tabs (Aspirin) .[name]... Take 1 tab daily    Norvasc 2.5 Mg Tabs (Amlodipine besylate) .[name]... Take 1 tablet by mouth once daily    Nitrostat 0.4 Mg Subl (Nitroglycerin) .[name]... 1 tablet under tongue at onset of chest pain; you may repeat every 5 minutes for up to 3 doses.  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 Continue medical therapy.  His updated medication list for this problem includes:    Effient 10 Mg Tabs (Prasugrel hcl) .[name]... Take one tablet by mouth daily    Toprol Xl 50 Mg Xr24h-tab (Metoprolol succinate) .[name]... Take 1 and 1/2 tablets (75mg ) by mouth two times a day    Aspirin 325 Mg Tabs (Aspirin) .[name]... Take 1 tab daily    Norvasc 2.5 Mg Tabs (Amlodipine besylate) .[name]... Take 1 tablet by mouth once daily    Nitrostat 0.4 Mg Subl (Nitroglycerin) .[name]... 1 tablet under tongue at onset of chest pain; you may repeat every 5 minutes for up to 3 doses.    Diovan Hct 320-25 Mg Tabs (Valsartan-hydrochlorothiazide) .[name]... Take 1 tablet by mouth once daily  Problem # 3:  DYSLIPIDEMIA (ICD-272.4)  Reports recent health assessment at his job. Plans to forward results for review.  His updated medication list for this problem includes:    Crestor 20 Mg Tabs (Rosuvastatin calcium) .[name]... Take 1 tab at bedtime  Problem # 4:  ESSENTIAL HYPERTENSION, BENIGN (ICD-401.1)  Blood pressure looks good today.  His updated medication list for this problem includes:     Toprol Xl 50 Mg Xr24h-tab (Metoprolol succinate) .[name]... Take 1 and 1/2 tablets (75mg ) by mouth two times a day    Aspirin 325 Mg Tabs (Aspirin) .[name]... Take 1 tab daily    Norvasc 2.5 Mg Tabs (Amlodipine besylate) .[name]... Take 1 tablet by mouth once daily    Diovan Hct 320-25 Mg Tabs (Valsartan-hydrochlorothiazide) .[name]... Take 1 tablet by mouth once daily  Patient Instructions: 1)  Your physician recommends that you schedule a follow-up appointment in: 6 months 2)  Your physician recommends that you continue on your current medications as directed. Please refer to the Current Medication list given to you today.

## 2010-12-23 NOTE — Progress Notes (Signed)
**Note De-Identified Talya Quain Obfuscation** Summary: Echo?  Phone Note Outgoing Call   Call placed by: Larita Fife Izrael Peak LPN,  April 30, 2010 3:49 PM Summary of Call: [facility]. Did pt. have Echo perform? It was scheduled for 04-20-10, no results in chart.  Follow-up for Phone Call        Pt. rescheduled Echo for 05-07-10. Follow-up by: Larita Fife Zola Runion LPN,  April 30, 2010 4:07 PM

## 2010-12-23 NOTE — Progress Notes (Signed)
Summary: rx refill  Phone Note Call from Patient Call back at Home Phone [phone] Call back at [phone]   Caller: pt wife melinda Reason for Call: Refill Medication Summary of Call: crestor 20mg  two a day? [facility] [place] Initial call taken by: Faythe Ghee,  October 27, 2009 12:14 PM    New/Updated Medications: CRESTOR 20 MG TABS (ROSUVASTATIN CALCIUM) take 1 tab at bedtime Prescriptions: CRESTOR 20 MG TABS (ROSUVASTATIN CALCIUM) take 1 tab at bedtime  #30 x 6   Entered by:   Larita Fife Via LPN   Authorized by:   Loreli Slot, MD, [facility]   Signed by:   Larita Fife Via LPN on 47/42/5956   Method used:   [facility] to        [facility]  [address] [id number] (retail)       [address]       [place], [place]  [address]       Ph: [phone] or [phone]       Fax: [phone]   RxID:   [id number]

## 2010-12-23 NOTE — Assessment & Plan Note (Signed)
Summary: 3 MTH F/U PER CHECKOUT ON 09/22/09/TG   Visit Type:  Follow-up Primary Provider:  Dr. [name]   History of Present Illness: 50 year old male presents for followup. He reports no progression in stable angina. He has lost approximately 20 pounds through a combination of diet, and unfortunately stress. He and his wife recently separated over the last month.  He is compliant with his medications. He does not feel as if we need to consider a cardiac catheterization as yet in light of his stable symptoms. We have discussed this on several occasions.  Current Medications (verified): 1)  Crestor 20 Mg Tabs (Rosuvastatin Calcium) .... Take 1 Tab At Bedtime 2)  Nexium 40 Mg Cpdr (Esomeprazole Magnesium) .... Take 1 Tab Daily 3)  Plavix 75 Mg Tabs (Clopidogrel Bisulfate) .... Take 1 Tab Daily 4)  Toprol Xl 50 Mg Xr24h-Tab (Metoprolol Succinate) .... Take 1 and 1/2 Tablets (75mg ) By Mouth Two Times A Day 5)  Aspirin 325 Mg Tabs (Aspirin) .... Take 1 Tab Daily 6)  Norvasc 2.5 Mg Tabs (Amlodipine Besylate) .... Take 1 Tablet By Mouth Once Daily 7)  Nitrostat 0.4 Mg Subl (Nitroglycerin) .[name].. 1 Tablet Under Tongue At Onset of Chest Pain; You May Repeat Every 5 Minutes For Up To 3 Doses. 8)  Diovan Hct 320-25 Mg Tabs (Valsartan-Hydrochlorothiazide) .... Take 1 Tablet By Mouth Once Daily 9)  Daily Multi  Tabs (Multiple Vitamins-Minerals) .... Take 1 Tab Daily 10)  Fish Oil 1000 Mg Caps (Omega-3 Fatty Acids) .... Take 1 Cap Two Times A Day  Allergies (verified): No Known Drug Allergies  Past History:  Past Medical History: Last updated: 09/01/2009 CAD - remote stent LAD, subsequent stents RCA with restenosis, DES to RCA and circ 06/2007 in setting of restenosis Hyperlipidemia Hypertension Hepatic steatosis Obstructive sleep apnea Myocardial Infarction - anterior  Social History: Last updated: 08/31/2009 Married  Tobacco Use - No.  Alcohol Use - no Regular Exercise - no Drug Use  - no  Review of Systems  The patient denies fever, vision loss, chest pain, peripheral edema, prolonged cough, headaches, hemoptysis, melena, and hematochezia.         Otherwise reviewed and negative.  Vital Signs:  Patient profile:   50 year old male Weight:      235 pounds Pulse rate:   81 / minute BP sitting:   124 / 75  (right arm)  Vitals Entered By: Dreama Saa, CNA (December 31, 2009 3:52 PM)  Physical Exam  Additional Exam:  No acute distress, no active chest pain. HEENT: Conjunctiva and lids normal, oropharynx clear. Neck: Supple, no elevated jugular venous pressure. Lungs: Clear to auscultation, nonlabored. Cardiac: Regular rate and rhythm, no S3 gallop, no loud systolic murmur. PMI indistinct. Skin: Warm and dry. Extremities: No pitting edema, distal pulses one plus.    Impression & Recommendations:  Problem # 1:  CORONARY ATHEROSCLEROSIS NATIVE CORONARY ARTERY (ICD-414.01)  Stable angina at this time on medical regimen. He remains comfortable with observation at this point. We have discussed repeat cardiac catheterization if his symptoms progress, and medical therapy is no longer reasonably effective. He obviously is going through a lot of stress at this point with the recent separation from his wife. I will see him back over the next few months.  His updated medication list for this problem includes:    Plavix 75 Mg Tabs (Clopidogrel bisulfate) .[name]... Take 1 tab daily    Toprol Xl 50 Mg Xr24h-tab (Metoprolol succinate) .[name]... Take 1 and  1/2 tablets (75mg ) by mouth two times a day    Aspirin 325 Mg Tabs (Aspirin) .[name]... Take 1 tab daily    Norvasc 2.5 Mg Tabs (Amlodipine besylate) .[name]... Take 1 tablet by mouth once daily    Nitrostat 0.4 Mg Subl (Nitroglycerin) .[name]... 1 tablet under tongue at onset of chest pain; you may repeat every 5 minutes for up to 3 doses.  Patient Instructions: 1)  Your physician recommends that you schedule a follow-up appointment in: 3  months 2)  Your physician recommends that you continue on your current medications as directed. Please refer to the Current Medication list given to you today.

## 2010-12-23 NOTE — Progress Notes (Signed)
Summary: rx needs called in today  Phone Note Call from Patient Call back at Home Phone [phone]   Reason for Call: Refill Medication Summary of Call: diovan 325 needs [facility] [place]. He is out. Initial call taken by: Faythe Ghee,  March 09, 2010 3:50 PM  Follow-up for Phone Call        RX sent, pt. aware. Follow-up by: Larita Fife Via LPN,  March 09, 2010 4:21 PM    Prescriptions: DIOVAN HCT 320-25 MG TABS (VALSARTAN-HYDROCHLOROTHIAZIDE) Take 1 tablet by mouth once daily  #30 x 3   Entered by:   Larita Fife Via LPN   Authorized by:   Loreli Slot, MD, [facility]   Signed by:   Larita Fife Via LPN on 91/47/8295   Method used:   Electronically to        [facility] [phone]* (retail)       [address], [place]  [address]       Ph: [phone]       Fax: [phone]   RxID:   [id number]

## 2010-12-23 NOTE — Progress Notes (Signed)
Summary: Effient  Phone Note Call from Patient Call back at [facility] Phone [phone]   Caller: Patient Summary of Call: PT CALL REGARDING  MEDICATION  THAT REPLACED HIS PLAVIX,PT DRUG STORE DIDN'T HAVE THIS MEDICATION AND ( THE PT DID NOT HAVE THE NAME OF  THE MEDICATION). Initial call taken by: Judie Grieve,  February 03, 2010 3:01 PM  Follow-up for Phone Call        The pt's pharmacy cannot get Effient until Thursday.  I spoke with Isabelle Course in the [place] office and she will provide the pt with Effient samples.  The pt is currently taking Effient 10mg  once a day.   Follow-up by: Julieta Gutting, RN, BSN,  February 03, 2010 3:25 PM    New/Updated Medications: EFFIENT 10 MG TABS (PRASUGREL HCL) Take one tablet by mouth daily

## 2010-12-23 NOTE — Letter (Signed)
Summary: [place] Results Engineer, agricultural at [facility]  [address], [place] [address]   Phone: [phone]  Fax: [phone]      May 17, 2010 MRN: [id number]   [name] [address] [place], [place]  [address]   Dear [name],  Your test ordered by Selena Batten has been reviewed by your physician (or physician assistant) and was found to be normal or stable. Your physician (or physician assistant) felt no changes were needed at this time.  __X__ Echocardiogram  ____ Cardiac Stress Test  ____ Lab Work  ____ Peripheral vascular study of arms, legs or neck  ____ CT scan or X-ray  ____ Lung or Breathing test  ____ Other: Please continue on current medical treatment.  Thank you.  Dr, Nona Dell, MD, [facility]

## 2010-12-23 NOTE — Letter (Signed)
Summary: Appointment - Missed  [facility] [place]  [address], [place] [address]   Phone: [phone]  Fax: [phone]     April 28, 2010 MRN: [id number]   [name] [address] [place], [place]  [address]   Dear [name],  Our records indicate you missed your appointment on      04/28/10         DR MCDOWELL              It is very important that we reach you to reschedule this appointment. We look forward to participating in your health care needs. Please contact us at the number listed above at your earliest convenience to reschedule this appointment.     Sincerely,    Glass blower/designer

## 2010-12-23 NOTE — Letter (Signed)
Summary: Engineer, materials at [facility]  [address]   [place], [place] [address]   Phone: [phone]  Fax: [phone]        January 29, 2010 MRN: [id number]    [name] [address] [place], [place]  [address]    Dear Mr. Kondo,  Your test ordered by Selena Batten has been reviewed by your physician (or physician assistant) and was found to be normal or stable. Your physician (or physician assistant) felt no changes were needed at this time.  ____ Echocardiogram  ____ Cardiac Stress Test  __X__ Lab Work  ____ Peripheral vascular study of arms, legs or neck  __X__ Chest X-ray  ____ Lung or Breathing test  ____ Other:   Thank you.   Cyril Loosen, RN, BSN    Duane Boston, M.D., F.A.C.C. Thressa Sheller, M.D., F.A.C.C. Oneal Grout, M.D., F.A.C.C. Cheree Ditto, M.D., F.A.C.C. Daiva Nakayama, M.D., F.A.C.C. Kenney Houseman, M.D., F.A.C.C. Jeanne Ivan, PA-C

## 2010-12-23 NOTE — Assessment & Plan Note (Signed)
Summary: eph   Visit Type:  Follow-up Primary Provider:  Dr. [name]   History of Present Illness: 50 year old male presents for a followup visit. I referred him for a repeat cardiac catheterization, performed by Dr. Excell Seltzer on 14 March. This procedure revealed severe 2 vessel obstructive coronary artery disease involving the LAD that was treated with overlapping drug-eluting stents, and also in-stent restenosis of the RCA, treated with a single drug-eluting stent. There was nonobstructive disease within the left circumflex. LVEF was estimated at 45%.  He reports feeling much better since intervention. He has returned to an exercise regimen gradually since discharge from the hospital. His only complaint is of some upper back pain that began in timing with starting Effient. This is a potential side effect of this medication, and has generally gotten better. He is not having any lower back pain and has had no groin site complications, stating that everything has healed very well.  Today we discussed his cardiac catheterization findings and intervention. He is very motivated to continue his medications and exercise.  Preventive Screening-Counseling & Management  Alcohol-Tobacco     Smoking Status: quit     Year Quit: 2001  Current Medications (verified): 1)  Crestor 20 Mg Tabs (Rosuvastatin Calcium) .... Take 1 Tab At Bedtime 2)  Nexium 40 Mg Cpdr (Esomeprazole Magnesium) .... Take 1 Tab Daily 3)  Effient 10 Mg Tabs (Prasugrel Hcl) .... Take One Tablet By Mouth Daily 4)  Toprol Xl 50 Mg Xr24h-Tab (Metoprolol Succinate) .... Take 1 and 1/2 Tablets (75mg ) By Mouth Two Times A Day 5)  Aspirin 325 Mg Tabs (Aspirin) .... Take 1 Tab Daily 6)  Norvasc 2.5 Mg Tabs (Amlodipine Besylate) .... Take 1 Tablet By Mouth Once Daily 7)  Nitrostat 0.4 Mg Subl (Nitroglycerin) .[name].. 1 Tablet Under Tongue At Onset of Chest Pain; You May Repeat Every 5 Minutes For Up To 3 Doses. 8)  Diovan Hct 320-25 Mg Tabs  (Valsartan-Hydrochlorothiazide) .... Take 1 Tablet By Mouth Once Daily 9)  Daily Multi  Tabs (Multiple Vitamins-Minerals) .... Take 1 Tab Daily 10)  Fish Oil 1000 Mg Caps (Omega-3 Fatty Acids) .... 2 Caps Two Times A Day  Allergies (verified): No Known Drug Allergies  Comments:  Nurse/Medical Assistant: The patient's medications and allergies were reviewed with the patient and were updated in the Medication and Allergy Lists. Verbally gave names and doses.  Past History:  Past Medical History: Last updated: 09/01/2009 CAD - remote stent LAD, subsequent stents RCA with restenosis, DES to RCA and circ 06/2007 in setting of restenosis Hyperlipidemia Hypertension Hepatic steatosis Obstructive sleep apnea Myocardial Infarction - anterior  Social History: Last updated: 08/31/2009 Married  Tobacco Use - No.  Alcohol Use - no Regular Exercise - no Drug Use - no   Review of Systems  The patient denies anorexia, fever, chest pain, syncope, dyspnea on exertion, peripheral edema, headaches, hemoptysis, abdominal pain, melena, hematochezia, and severe indigestion/heartburn.         Otherwise reviewed and negative except as outlined above.  Vital Signs:  Patient profile:   49 year old male Height:      73 inches Weight:      216 pounds Pulse rate:   70 / minute BP sitting:   118 / 71  (left arm) Cuff size:   large  Vitals Entered By: Carlye Grippe (February 24, 2010 2:53 PM)  Physical Exam  Additional Exam:  No acute distress, no active chest pain. HEENT: Conjunctiva and lids normal,  oropharynx clear. Neck: Supple, no elevated jugular venous pressure. Lungs: Clear to auscultation, nonlabored. Cardiac: Regular rate and rhythm, no S3 gallop, no loud systolic murmur. PMI indistinct. Skin: Warm and dry. Extremities: No pitting edema, distal pulses one plus, no groin hematoma. Musculoskeletal: No gross deformities. Neuropsychiatric: Alert and oriented x3, affect  appropriate.    Cardiac Cath  Procedure date:  02/01/2010  Findings:       PROCEDURAL FINDINGS:  Left ventricular pressure was 106/21, aortic   pressure was 106/58.      Left ventriculography shows hypokinesis of the anteroapex.  The LVEF is   estimated at 45%.      Left main stem:  The left main is patent.  There are minor lumen   irregularities.  The main stem divides into the LAD and left circumflex.      LAD:  The LAD has a patent proximal stent with mild diffuse in-stent   restenosis.  There is a second stent in the mid vessel that is also   patent with mild diffuse in-stent restenosis.  The intervening segment   has critical stenosis with a 99% lesion and TIMI 2 flow beyond the area   of severe stenosis.  The stenotic area involves the proximal stent edge   of the mid LAD stent.  The vessel beyond that area appears to be   underfilled.      Left circumflex:  There is a small intermediate branch present.  The AV   groove circumflex courses down and has minor plaque in the midportion   beyond the area of nonobstructive plaque.  There is a widely patent   stent with no significant in-stent restenosis.  The vessel supplies a   large left posterolateral branch.      Right coronary artery:  The right coronary artery has multiple stents.   The proximal stented segment has mild eccentric in-stent restenosis of   approximately 30%.  At the junction of the mid and distal vessel, there   is a stent with severe focal in-stent restenosis.  The remaining distal   RCA has minor lumen irregularity.  The vessel divides into the PDA and   posterolateral branch.  Impression & Recommendations:  Problem # 1:  CORONARY ATHEROSCLEROSIS NATIVE CORONARY ARTERY (ICD-414.01)  Patient symptomatically improved following percutaneous intervention as discussed above. He will continue on his present medications including dual antiplatelet therapy with aspirin and Effient. He has returned to gradual  exercise. I plan to see him back over the next 8 weeks.  His updated medication list for this problem includes:    Effient 10 Mg Tabs (Prasugrel hcl) .[name]... Take one tablet by mouth daily    Toprol Xl 50 Mg Xr24h-tab (Metoprolol succinate) .[name]... Take 1 and 1/2 tablets (75mg ) by mouth two times a day    Aspirin 325 Mg Tabs (Aspirin) .[name]... Take 1 tab daily    Norvasc 2.5 Mg Tabs (Amlodipine besylate) .[name]... Take 1 tablet by mouth once daily    Nitrostat 0.4 Mg Subl (Nitroglycerin) .[name]... 1 tablet under tongue at onset of chest pain; you may repeat every 5 minutes for up to 3 doses.  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 We plan a followup 2-D echocardiogram prior to his next visit. Hopefully he will show some improvement following revascularization.  His updated medication list for this problem includes:    Effient 10 Mg Tabs (Prasugrel hcl) .[name]... Take one  tablet by mouth daily    Toprol Xl 50 Mg Xr24h-tab (Metoprolol succinate) .[name]... Take 1 and 1/2 tablets (75mg ) by mouth two times a day    Aspirin 325 Mg Tabs (Aspirin) .[name]... Take 1 tab daily    Norvasc 2.5 Mg Tabs (Amlodipine besylate) .[name]... Take 1 tablet by mouth once daily    Nitrostat 0.4 Mg Subl (Nitroglycerin) .[name]... 1 tablet under tongue at onset of chest pain; you may repeat every 5 minutes for up to 3 doses.    Diovan Hct 320-25 Mg Tabs (Valsartan-hydrochlorothiazide) .[name]... Take 1 tablet by mouth once daily  Orders: 2-D Echocardiogram (2D Echo)  Problem # 3:  ESSENTIAL HYPERTENSION, BENIGN (ICD-401.1)  Blood pressure well controlled today.  His updated medication list for this problem includes:    Toprol Xl 50 Mg Xr24h-tab (Metoprolol succinate) .[name]... Take 1 and 1/2 tablets (75mg ) by mouth two times a day    Aspirin 325 Mg Tabs (Aspirin) .[name]... Take 1 tab daily    Norvasc 2.5 Mg Tabs (Amlodipine besylate) .[name]... Take 1 tablet by mouth once daily     Diovan Hct 320-25 Mg Tabs (Valsartan-hydrochlorothiazide) .[name]... Take 1 tablet by mouth once daily  Patient Instructions: 1)  Your physician recommends that you schedule a follow-up appointment in: 8 weeks 2)  Your physician recommends that you continue on your current medications as directed. Please refer to the Current Medication list given to you today. 3)  Your physician has requested that you have an echocardiogram.  Echocardiography is a painless test that uses sound waves to create images of your heart. It provides your doctor with information about the size and shape of your heart and how well your heart's chambers and valves are working.  This procedure takes approximately one hour. There are no restrictions for this procedure.

## 2010-12-23 NOTE — Miscellaneous (Signed)
Summary: lipids,lft 04/27/2009  Clinical Lists Changes  Observations: Added new observation of ALBUMIN: 4.2 g/dL (24/40/1027 25:36) Added new observation of PROTEIN, TOT: 6.6 g/dL (64/40/3474 25:95) Added new observation of SGPT (ALT): 37 units/L (09/22/2009 11:18) Added new observation of SGOT (AST): 23 units/L (09/22/2009 11:18) Added new observation of ALK PHOS: 49 units/L (09/22/2009 11:18) Added new observation of BILI DIRECT: <0.1 mg/dL (63/87/5643 32:95) Added new observation of LDL: 82 mg/dL (18/84/1660 63:01) Added new observation of HDL: 37 mg/dL (60/08/9322 55:73) Added new observation of TRIGLYC TOT: 171 mg/dL (22/12/5425 06:23) Added new observation of CHOLESTEROL: 153 mg/dL (76/28/3151 76:16)

## 2010-12-23 NOTE — Assessment & Plan Note (Signed)
Summary: 3 WK F/U PER CHECKOUT ON 09/01/09/TG   Visit Type:  Follow-up Primary Provider:  Dr. [name]   History of Present Illness: 50 year-old male returns for a quick followup visit. When I last saw him he was describing active exertional angina and shortness of breath. Per our discussion, we added low-dose Norvasc as an anti-anginal, and I recommended weight loss with close symptom observation. He returns today stating that he feels better. He is not reporting frank angina at this time, only NYHA class II dyspnea on exertion. He has tolerated Norvasc, and has been able to lose approximately 6 pounds. We talked about the options again today, and he feels more comfortable with observation on medical therapy. We can certainly pursue a cardiac catheterization if the situation changes.  Current Medications (verified): 1)  Crestor 20 Mg Tabs (Rosuvastatin Calcium) .... Take 1 Tab At Bedtime 2)  Nexium 40 Mg Cpdr (Esomeprazole Magnesium) 3)  Plavix 75 Mg Tabs (Clopidogrel Bisulfate) 4)  Toprol Xl 50 Mg Xr24h-Tab (Metoprolol Succinate) .... Take 1 and 1/2 Tablets (75mg ) By Mouth Two Times A Day 5)  Aspirin 325 Mg Tabs (Aspirin) .... Take 1 Tab Daily 6)  Norvasc 2.5 Mg Tabs (Amlodipine Besylate) .... Take 1 Tablet By Mouth Once Daily 7)  Nitrostat 0.4 Mg Subl (Nitroglycerin) .[name].. 1 Tablet Under Tongue At Onset of Chest Pain; You May Repeat Every 5 Minutes For Up To 3 Doses. 8)  Diovan Hct 320-25 Mg Tabs (Valsartan-Hydrochlorothiazide) .... Take 1 Tablet By Mouth Once Daily 9)  Daily Multi  Tabs (Multiple Vitamins-Minerals) .... Take 1 Tab Daily 10)  Fish Oil 1000 Mg Caps (Omega-3 Fatty Acids) .... Take 1 Cap Two Times A Day  Allergies (verified): No Known Drug Allergies  Past History:  Social History: Last updated: 08/31/2009 Married  Tobacco Use - No.  Alcohol Use - no Regular Exercise - no Drug Use - no  Review of Systems  The patient denies anorexia, fever, chest pain,  syncope, hemoptysis, abdominal pain, melena, and hematochezia.         Otherwise reviewed and negative.  Vital Signs:  Patient profile:   50 year old male Weight:      245 pounds Pulse rate:   94 / minute BP sitting:   130 / 86  (right arm)  Vitals Entered By: Dreama Saa, CNA (September 22, 2009 3:42 PM)  Physical Exam  Additional Exam:  Morbidly obese male in no acute distress, no active chest pain. HEENT: Conjunctiva and lids normal, oropharynx clear. Neck: Supple, no elevated jugular venous pressure. Lungs: Clear to auscultation, nonlabored. Cardiac: Regular rate and rhythm, no S3 gallop, no loud systolic murmur. PMI indistinct. Abdomen: Obese, cannot palpate liver edge, bowel sounds present, nontender. Skin: Warm and dry. Extremities: No pitting edema, distal pulses one plus.    Impression & Recommendations:  Problem # 1:  ANGINA, STABLE (ICD-413.9)  Symptoms improved over the last few weeks following initiation of Norvasc. Mr. Breach has also been able to lose 6 pounds. He remains comfortable with observation. I plan to see him back over the next 3 months, sooner if his symptoms progress. We have already discussed proceeding with a cardiac catheterization if the situation changes.  His updated medication list for this problem includes:    Plavix 75 Mg Tabs (Clopidogrel bisulfate)    Toprol Xl 50 Mg Xr24h-tab (Metoprolol succinate) .[name]... Take 1 and 1/2 tablets (75mg ) by mouth two times a day    Aspirin 325 Mg  Tabs (Aspirin) .[name]... Take 1 tab daily    Norvasc 2.5 Mg Tabs (Amlodipine besylate) .[name]... Take 1 tablet by mouth once daily    Nitrostat 0.4 Mg Subl (Nitroglycerin) .[name]... 1 tablet under tongue at onset of chest pain; you may repeat every 5 minutes for up to 3 doses.  Problem # 2:  CORONARY ATHEROSCLEROSIS NATIVE CORONARY ARTERY (ICD-414.01)  Status post multiple percutaneous coronary interventions, mostly related to restenosis within the right coronary artery  distribution. Drug-eluting stents were placed in the right coronary artery and circumflex back in 2008. If he continues to manifest restenosis, particularly in the setting of advancing atherosclerosis elsewhere, bypass surgery may well be his best option for a more durable revascularization over time.  His updated medication list for this problem includes:    Plavix 75 Mg Tabs (Clopidogrel bisulfate)    Toprol Xl 50 Mg Xr24h-tab (Metoprolol succinate) .[name]... Take 1 and 1/2 tablets (75mg ) by mouth two times a day    Aspirin 325 Mg Tabs (Aspirin) .[name]... Take 1 tab daily    Norvasc 2.5 Mg Tabs (Amlodipine besylate) .[name]... Take 1 tablet by mouth once daily    Nitrostat 0.4 Mg Subl (Nitroglycerin) .[name]... 1 tablet under tongue at onset of chest pain; you may repeat every 5 minutes for up to 3 doses.  Patient Instructions: 1)  Your physician recommends that you schedule a follow-up appointment in: 3 months 2)  Your physician recommends that you continue on your current medications as directed. Please refer to the Current Medication list given to you today.

## 2010-12-23 NOTE — Progress Notes (Signed)
Summary: chest pain  Phone Note Call from Patient Call back at [phone]   Reason for Call: Talk to Nurse Summary of Call: pt having chest pain Initial call taken by: Faythe Ghee,  August 25, 2009 3:57 PM  Follow-up for Phone Call        stairs cause sob and lightheadedness, having chest pain with exertion within the last month appt scheduled for next Thursday Follow-up by: Teressa Lower RN,  August 25, 2009 4:21 PM

## 2010-12-23 NOTE — Assessment & Plan Note (Signed)
Summary: CHEST PAIN-JM   Visit Type:  Follow-up Primary Provider:  Dr. [name]   History of Present Illness: 50 year old male presents for a followup visit. He called the office describing progressive chest pain symptoms and was added on to the schedule today. I have been following him over the last several months with medication adjustments related to exertional angina. We have already discussed the possibility of a repeat diagnostic cardiac catheterization.  He has been under a lot of stress related to marital problems, however within the last few weeks has been experiencing increasing angina with exercise, and has noted increased frequency and intensity of episodes over the last week in particular. He has also noted angina with emotional upset. He reports compliance with his medications.  I met with him today and we discussed making arrangements to proceed with a diagnostic cardiac catheterization on Monday with Dr. Excell Seltzer. He is in agreement to proceed.   Preventive Screening-Counseling & Management  Alcohol-Tobacco     Smoking Status: quit     Year Started: 22 years     Year Quit: 2001  Current Medications (verified): 1)  Crestor 20 Mg Tabs (Rosuvastatin Calcium) .... Take 1 Tab At Bedtime 2)  Nexium 40 Mg Cpdr (Esomeprazole Magnesium) .... Take 1 Tab Daily 3)  Plavix 75 Mg Tabs (Clopidogrel Bisulfate) .... Take 1 Tab Daily 4)  Toprol Xl 50 Mg Xr24h-Tab (Metoprolol Succinate) .... Take 1 and 1/2 Tablets (75mg ) By Mouth Two Times A Day 5)  Aspirin 325 Mg Tabs (Aspirin) .... Take 1 Tab Daily 6)  Norvasc 2.5 Mg Tabs (Amlodipine Besylate) .... Take 1 Tablet By Mouth Once Daily 7)  Nitrostat 0.4 Mg Subl (Nitroglycerin) .[name].. 1 Tablet Under Tongue At Onset of Chest Pain; You May Repeat Every 5 Minutes For Up To 3 Doses. 8)  Diovan Hct 320-25 Mg Tabs (Valsartan-Hydrochlorothiazide) .... Take 1 Tablet By Mouth Once Daily 9)  Daily Multi  Tabs (Multiple Vitamins-Minerals) .... Take 1  Tab Daily 10)  Fish Oil 1000 Mg Caps (Omega-3 Fatty Acids) .... 2 Caps Two Times A Day  Allergies (verified): No Known Drug Allergies  Past History:  Past Medical History: Last updated: 09/01/2009 CAD - remote stent LAD, subsequent stents RCA with restenosis, DES to RCA and circ 06/2007 in setting of restenosis Hyperlipidemia Hypertension Hepatic steatosis Obstructive sleep apnea Myocardial Infarction - anterior  Past Surgical History: Last updated: 09/01/2009 Unremarkable  Family History: Last updated: 09/01/2009 Family History of Coronary Artery Disease  Social History: Last updated: 08/31/2009 Married  Tobacco Use - No.  Alcohol Use - no Regular Exercise - no Drug Use - no  Clinical Review Panels:  Cardiac Imaging Cardiac Cath Findings  ASSESSMENT:  Successful percutaneous coronary intervention of the right   coronary artery and left circumflex with Taxus drug-eluting stents.      RECOMMENDATIONS:  With the multiple stents that Mr. Fessel has, I would   recommend ongoing dual antiplatelet therapy lifelong as he tolerates.               Veverly Fells. Excell Seltzer, MD   Electronically Signed  (07/20/2007)    Social History: Smoking Status:  quit  Review of Systems       The patient complains of dyspnea on exertion and headaches.  The patient denies anorexia, fever, weight loss, peripheral edema, hemoptysis, abdominal pain, melena, hematochezia, and severe indigestion/heartburn.         Otherwise reviewed and negative.  Vital Signs:  Patient profile:   50  year old male Height:      73 inches Weight:      222 pounds Pulse rate:   76 / minute BP sitting:   126 / 78  (right arm) Cuff size:   regular  Vitals Entered By: Hoover Brunette, LPN (January 29, 2010 1:42 PM) Is Patient Diabetic? No Comments chest pain x several months off/on.   Worse over last 3-4 weeks.  Also, c/o SOB & dizziness.     Physical Exam  Additional Exam:  No acute distress, no active chest  pain. HEENT: Conjunctiva and lids normal, oropharynx clear. Neck: Supple, no elevated jugular venous pressure. Lungs: Clear to auscultation, nonlabored. Cardiac: Regular rate and rhythm, no S3 gallop, no loud systolic murmur. PMI indistinct. Skin: Warm and dry. Extremities: No pitting edema, distal pulses one plus. Musculoskeletal: No gross deformities. Neuropsychiatric: Alert and oriented x3, affect appropriate.    EKG  Procedure date:  01/29/2010  Findings:      Normal sinus rhythm at 71 beats per minute with nonspecific ST-T wave changes.  Impression & Recommendations:  Problem # 1:  UNSTABLE ANGINA (ICD-411.1)  Specifically progressive exertional angina, both in frequency and intensity, particularly within the last week. Complicating factor includes stress over the last several months. He has been compliant with medications. We discussed proceeding on with a diagnostic cardiac catheterization to best understand his coronary anatomy, exclude significant in-stent restenosis, and evaluate for any potential revascularization options. He will be scheduled with Dr. Excell Seltzer on Monday.  His updated medication list for this problem includes:    Plavix 75 Mg Tabs (Clopidogrel bisulfate) .[name]... Take 1 tab daily    Toprol Xl 50 Mg Xr24h-tab (Metoprolol succinate) .[name]... Take 1 and 1/2 tablets (75mg ) by mouth two times a day    Aspirin 325 Mg Tabs (Aspirin) .[name]... Take 1 tab daily    Norvasc 2.5 Mg Tabs (Amlodipine besylate) .[name]... Take 1 tablet by mouth once daily    Nitrostat 0.4 Mg Subl (Nitroglycerin) .[name]... 1 tablet under tongue at onset of chest pain; you may repeat every 5 minutes for up to 3 doses.  Problem # 2:  ESSENTIAL HYPERTENSION, BENIGN (ICD-401.1)  Blood pressure fairly well controlled.  His updated medication list for this problem includes:    Toprol Xl 50 Mg Xr24h-tab (Metoprolol succinate) .[name]... Take 1 and 1/2 tablets (75mg ) by mouth two times a day    Aspirin 325 Mg Tabs  (Aspirin) .[name]... Take 1 tab daily    Norvasc 2.5 Mg Tabs (Amlodipine besylate) .[name]... Take 1 tablet by mouth once daily    Diovan Hct 320-25 Mg Tabs (Valsartan-hydrochlorothiazide) .[name]... Take 1 tablet by mouth once daily  Other Orders: EKG w/ Interpretation (93000) Cardiac Catheterization (Cardiac Cath) T-Chest x-ray, 2 views (16109) T-Basic Metabolic Panel (60454-09811) T-CBC No Diff ([id number]) T-PTT ([id number]) T-Protime, Auto ([id number])  Patient Instructions: 1)  Your physician has requested that you have a cardiac catheterization.  Cardiac catheterization is used to diagnose and/or treat various heart conditions. Doctors may recommend this procedure for a number of different reasons. The most common reason is to evaluate chest pain. Chest pain can be a symptom of coronary artery disease (CAD), and cardiac catheterization can show whether plaque is narrowing or blocking your heart's arteries. This procedure is also used to evaluate the valves, as well as measure the blood flow and oxygen levels in different parts of your heart.  For further information please visit [email or web address]  Please follow instruction sheet, as given. 2)  A  chest x-ray takes a picture of the organs and structures inside the chest, including the heart, lungs, and blood vessels. This test can show several things, including, whether the heart is enlarged; whether fluid is building up in the lungs; and whether pacemaker / defibrillator leads are still in place. DO TODAY. 3)  Your physician recommends that you go to the [facility] for lab work. DO TODAY.

## 2011-02-11 LAB — CBC
HCT: 38.9 % — ABNORMAL LOW (ref 39.0–52.0)
HCT: 40.7 % (ref 39.0–52.0)
Hemoglobin: 13.7 g/dL (ref 13.0–17.0)
Hemoglobin: 14.2 g/dL (ref 13.0–17.0)
MCHC: 34.9 g/dL (ref 30.0–36.0)
MCHC: 35.2 g/dL (ref 30.0–36.0)
MCV: 91.5 fL (ref 78.0–100.0)
MCV: 92.4 fL (ref 78.0–100.0)
Platelets: 105 10*3/uL — ABNORMAL LOW (ref 150–400)
Platelets: 119 10*3/uL — ABNORMAL LOW (ref 150–400)
RBC: 4.25 MIL/uL (ref 4.22–5.81)
RBC: 4.41 MIL/uL (ref 4.22–5.81)
RDW: 12.6 % (ref 11.5–15.5)
RDW: 12.9 % (ref 11.5–15.5)
WBC: 4.3 10*3/uL (ref 4.0–10.5)
WBC: 5.2 10*3/uL (ref 4.0–10.5)

## 2011-02-11 LAB — BASIC METABOLIC PANEL
BUN: 13 mg/dL (ref 6–23)
BUN: 14 mg/dL (ref 6–23)
CO2: 29 mEq/L (ref 19–32)
CO2: 30 mEq/L (ref 19–32)
Calcium: 8.9 mg/dL (ref 8.4–10.5)
Calcium: 9.2 mg/dL (ref 8.4–10.5)
Chloride: 103 mEq/L (ref 96–112)
Chloride: 104 mEq/L (ref 96–112)
Creatinine, Ser: 1.01 mg/dL (ref 0.4–1.5)
Creatinine, Ser: 1.12 mg/dL (ref 0.4–1.5)
GFR calc Af Amer: >60 mL/min (ref >60)
GFR calc Af Amer: >60 mL/min (ref >60)
GFR calc non Af Amer: >60 mL/min (ref >60)
GFR calc non Af Amer: >60 mL/min (ref >60)
Glucose, Bld: 106 mg/dL — ABNORMAL HIGH (ref 70–99)
Glucose, Bld: 97 mg/dL (ref 70–99)
Potassium: 3.7 mEq/L (ref 3.5–5.1)
Potassium: 3.7 mEq/L (ref 3.5–5.1)
Sodium: 140 mEq/L (ref 135–145)
Sodium: 141 mEq/L (ref 135–145)

## 2011-02-11 LAB — CARDIAC PANEL(CRET KIN+CKTOT+MB+TROPI)
CK, MB: 1.4 ng/mL (ref 0.3–4.0)
CK, MB: 1.6 ng/mL (ref 0.3–4.0)
CK, MB: 1.7 ng/mL (ref 0.3–4.0)
Relative Index: INVALID (ref 0.0–2.5)
Relative Index: INVALID (ref 0.0–2.5)
Relative Index: INVALID (ref 0.0–2.5)
Total CK: 68 U/L (ref 7–232)
Total CK: 73 U/L (ref 7–232)
Total CK: 78 U/L (ref 7–232)
Troponin I: 0.02 ng/mL (ref 0.00–0.06)
Troponin I: 0.04 ng/mL (ref 0.00–0.06)
Troponin I: 0.04 ng/mL (ref 0.00–0.06)

## 2011-02-11 LAB — LIPID PANEL
Cholesterol: 90 mg/dL (ref 0–200)
HDL: 24 mg/dL — ABNORMAL LOW (ref >39)
LDL Cholesterol: 42 mg/dL (ref 0–99)
Total CHOL/HDL Ratio: 3.8 RATIO
Triglycerides: 120 mg/dL (ref <150)
VLDL: 24 mg/dL (ref 0–40)

## 2011-02-11 LAB — BRAIN NATRIURETIC PEPTIDE: Pro B Natriuretic peptide (BNP): 55 pg/mL (ref 0.0–100.0)

## 2011-03-28 ENCOUNTER — Other Ambulatory Visit: Payer: Self-pay | Admitting: Cardiology

## 2011-04-05 NOTE — Cardiovascular Report (Signed)
NAMECRAY, MONNIN                 ACCOUNT NO.:  [id number]   MEDICAL RECORD NO.:  [id number]          PATIENT TYPE:  INP   LOCATION:  6531                         FACILITY:  [facility]   PHYSICIAN:  Veverly Fells. Excell Seltzer, MD  DATE OF BIRTH:  11/17/1961   DATE OF PROCEDURE:  07/20/2007  DATE OF DISCHARGE:                            CARDIAC CATHETERIZATION   PROCEDURE:  Left heart catheterization, selective coronary angiography,  left ventricular angiography.   INDICATIONS:  [name] is a 50 year old gentleman from [place],  [place].  He sees Dr. Diona Browner and has had a prior anterior wall  MI.  He has undergone stent placement in the LAD and the right coronary  artery.  He has had difficulty with in-stent restenosis in the right  coronary artery and has had multiple drug-eluting stents in that vessel.  He was last treated by Dr. Riley Kill with cutting balloon angioplasty for  treatment of in-stent restenosis.  He has developed progressive  exertional angina and was referred for cardiac catheterization.   Risks and indications of the procedure were reviewed with the patient  and informed consent was obtained.  The right groin was prepped, draped,  anesthetized with 1% lidocaine using modified Seldinger technique.  A 4-  French sheath was placed in the left femoral artery.  Multiple views of  the right and left coronary arteries were taken using standard preformed  Judkins catheters.  Following selective angiography, an angled pigtail  catheter was inserted into the left ventricle where pressures were  recorded.  Left ventriculogram was performed.  A pullback across the  aortic valve was done.  At the completion of the procedure, the sheath  was left in place and the patient is going to be admitted to the  hospital for PCI of the right coronary artery.  All catheter exchanges  were performed over a guidewire.  There were no immediate complications.   FINDINGS:  Aortic pressure 98/68  with mean of 83, left ventricular  pressure 104/12.   The left mainstem is long.  It is widely patent with no significant  stenoses.  The left main trifurcates into the LAD, left circumflex and a  small intermediate branch.   The LAD is a medium caliber vessel that courses down and wraps around  the LV apex.  The proximal portion of the LAD is angiographically  normal.  There is a widely patent stent at the area of the first septal  perforator.  The first diagonal branch that arises from this area is  occluded in its proximal aspect.  There is a second stent in the mid LAD  that is widely patent.  There is an intervening segment between the two  stents that has moderate stenosis of approximately 50%.  The remaining  portions of the mid and distal LAD have no significant angiographic  stenosis.   The left circumflex is a diffusely diseased, a large-caliber vessel.  It  courses down the AV groove and, in its mid portion, has a focal 70%  lesion.  In the proximal portion of circumflex there are diffuse luminal  irregularities.  There is a large posterolateral branch that arises from  the left circumflex and has no significant angiographic stenoses.  There  are two very small OM branches.   The right coronary artery is dominant.  There is a segment of stent in  the proximal and mid portion of the vessel.  There is a focal 30%  stenosis in the mid portion of the stent.  It is eccentric.  Beyond the  stented segment, there is a focal eccentric 80% stenosis in the mid  portion of the right coronary artery.  The distal right coronary artery  has luminal irregularities.  The vessel distally divides into a PDA  branch which has no significant angiographic stenosis and a posterior AV  segment branch that has diffuse luminal irregularities and supplies two  small posterolateral branches, also with diffuse nonobstructive plaque.   Left ventricular function assessed by ventriculography is normal  with an  estimated LVEF of 55-60%.  There is no mitral regurgitation.   ASSESSMENT:  1. Severe right coronary artery stenosis from a de Novo lesion beyond      the stented segment.  2. The patent stents in the proximal and mid left anterior descending      with moderate mid left anterior descending stenosis in the      intervening segment of vessel.  3. Moderate focal left circumflex stenosis.  4. Normal left ventricular function.   PLAN:  I reviewed the findings with [name].  I think PCI is indicated  on the focal high-grade stenosis in the mid to distal right coronary  artery.  He strongly desires to have this procedure performed today, so  he will be admitted and PCI will be done later today.  The sheath was  left in place and he will be started on intravenous unfractionated  heparin.  He will be reloaded on clopidogrel and has already received  aspirin today.  I am going to review his old films to compare his left  circumflex but I am leaning towards PCI of the lesion in the left  circumflex as well.  I think his LAD lesion can be managed medically.      Veverly Fells. Excell Seltzer, MD  Electronically Signed     MDC/MEDQ  D:  07/20/2007  T:  07/21/2007  Job:  [id number]   cc:   Jonelle Sidle, MD  [name], M.D.

## 2011-04-05 NOTE — Assessment & Plan Note (Signed)
[facility]                            CARDIOLOGY OFFICE NOTE   NAME:[name], [name]                        MRN:          [id number]  DATE:08/16/2007                            DOB:          08/11/1961    PRIMARY CARE PHYSICIAN:  Dr. [name].   I recently saw Mr. Perotti in the office on September 12 following a  recent coronary intervention.  His history is detailed in my previous  note.  He is status post placement of 2 drug-eluting stents within the  circumflex and right coronary artery, and tolerated this well with  improvement in symptoms.  He continues on Plavix.  We have otherwise  increased his Crestor to 20 mg daily.  He is apparently being considered  for rotator cuff surgery and, in light of the fact that he is  symptomatically stable and without evidence of recent infarct, I  anticipate that he should be able to proceed with this, assuming that he  does not discontinue either aspirin or Plavix for the operation.  He  would carry a significantly increased risk of acute stent thrombosis,  were he could discontinue his Plavix and aspirin in light of his recent  stent placement.  Otherwise, his medical regimen is stable and he has  been doing well.  If we can be of further assistance, please do not  hesitate to contact us.     Jonelle Sidle, MD  Electronically Signed    SGM/MedQ  DD: 08/16/2007  DT: 08/17/2007  Job #: [id number]   cc:   Nadara Mustard, MD

## 2011-04-05 NOTE — Assessment & Plan Note (Signed)
[facility]                            CARDIOLOGY OFFICE NOTE   NAME:[name]                        MRN:          [id number]  DATE:05/19/2008                            DOB:          Apr 02, 1961    PRIMARY CARE PHYSICIAN:  [name], M.D.   REASON FOR VISIT:  Cardiac followup.   HISTORY OF PRESENT ILLNESS:  [name] is doing well.  He is not  reporting any angina or breathlessness.  He started back exercising at  the gym and plans to lose weight back towards his baseline approximately  a year ago.  His electrocardiogram is overall stable showing nonspecific  ST-T wave changes and Q in lead III.  He is tolerating his medications  including an increase in Crestor and omega 3 supplements.  He is due for  followup liver function and lipids.  He mentions problems with erectile  dysfunction and we discussed this before.  He is not taking any long-  acting nitrates at this time.   ALLERGIES:  No known drug allergies.   MEDICATIONS:  1. Multivitamin one p.o. daily.  2. Plavix 75 mg p.o. daily.  3. Aspirin 325 mg p.o. daily.  4. Nexium 40 mg p.o. daily.  5. Toprol-XL 75 mg p.o. b.i.d.  6. Diovan HCT 320/25 mg p.o. daily.  7. Crestor 40 mg p.o. daily.  8. Omega 3 supplements 4 grams daily.   REVIEW OF SYSTEMS:  As per history of present illness.  No dizziness or  syncope.   PHYSICAL EXAMINATION:  VITAL SIGNS:  Blood pressure is 150/85, heart  rate of 84, weight is 248 pounds (previously 227 back in March of last  year).  GENERAL:  An overweight male in no acute distress.  HEENT:  Conjunctivae is normal.  Oropharynx is clear.  NECK:  Supple.  No elevated jugular venous pressure.  No audible bruits.  No thyromegaly is noted.  LUNGS:  Clear without labored breathing at rest.  CARDIAC:  Regular rate and rhythm.  No loud murmur or gallop.  ABDOMEN:  Soft, nontender.  EXTREMITIES:  Exhibit no pitting edema.  Distal pulses are 2+.  SKIN:   Warm and dry.  MUSCULOSKELETAL:  No kyphosis noted.  NEUROPSYCHIATRIC:  The patient is alert and oriented x3.  Affect is  normal.   IMPRESSION AND RECOMMENDATIONS:  1. Coronary artery disease, status post previous intervention to the      right coronary artery due to in-stent restenosis.  Most recently,      the patient is status post drug-eluting stent placement to the      right coronary artery and circumflex in 2008 and is doing well      symptomatically on medical therapy.  I have encouraged him to work      on diet and exercise and I will plan to see him back in our      [place] office over the next 6 months.  2. For erectile dysfunction, he will try p.r.n. Viagra.  He is not on      any long-acting nitrates or alpha  blockers.  He has stable      cardiovascular symptoms.  3. Hyperlipidemia, due for followup lipid profile and liver function      tests.  We can modify therapy from there.     Jonelle Sidle, MD  Electronically Signed    SGM/MedQ  DD: 05/19/2008  DT: 05/20/2008  Job #: [id number]   cc:   [name], M.D.

## 2011-04-05 NOTE — Assessment & Plan Note (Signed)
[facility]                            CARDIOLOGY OFFICE NOTE   NAME:[name], [name]                        MRN:          [id number]  DATE:08/03/2007                            DOB:          09-Sep-1961    PRIMARY CARE PHYSICIAN:  [name], M.D.   REASON FOR VISIT:  Follow up coronary intervention.   HISTORY OF PRESENT ILLNESS:  I saw [name] recently in late August.  I  referred him for repeat cardiac catheterization, given symptoms of  progressive exertional angina, concerning for in-stent restenosis.  Dr.  Excell Seltzer performed the procedure and the patient was found to have a  significant right coronary artery stenosis in a de novo area that was  actually beyond the stented segment.  In addition, he had a focal 70%  stenosis in the midportion of the circumflex vessel that was nonstented.  He was ultimately treated with drug eluting stents, to address the right  coronary artery and circumflex stenoses and he reports significant  improvement in symptoms at this time.  Our general recommendation is for  life-long dual antiplatelet therapy, given his multiple drug eluting  stents.  We also discussed a followup lipid profile today to make sure  that his lipid control is adequate.  He seems to have fairly aggressive  atherosclerosis and it may be that he needs further up-titration of his  Crestor.  Otherwise, he tolerated the procedure well.  He states that he  is being considered for the possibility of left rotator cuff surgery,  although I explained that I would defer on this, given his recently-  placed drug eluting stent, and my recommendation not to hold Plavix at  this time.   ALLERGIES:  No known drug allergies.   PRESENT MEDICATIONS:  1. Multivitamin one p.o. daily.  2. Plavix 75 mg p.o. daily.  3. Aspirin 325 mg p.o. daily.  4. Nexium 40 mg p.o. daily.  5. Crestor 10 mg p.o. daily.  6. Toprol XL 75 mg p.o. daily.  7. Diovan/HCT 320/25  mg p.o. daily.  8. Vicodin 7.5 mg as directed.   REVIEW OF SYSTEMS:  As described in History of Present Illness.   EXAMINATION:  Blood pressure is 108/70, heart rate is 74, weight is 229  pounds.  Patient is comfortable and in no acute discomfort.  NECK:  Examination of the neck shows no elevated jugular venous  pressure.  LUNGS:  Clear without labored breathing.  CARDIAC EXAM:  Reveals a regular rate and rhythm without rub, murmur or  gallop.  EXTREMITIES:  Show no pitting edema, no ecchymoses, bruit.  Distal  pulses are full.  SKIN:  Warm and dry.  MUSCULOSKELETAL:  No kyphosis is noted.  NEUROPSYCHIATRIC:  Patient is alert and oriented times three.   IMPRESSION/RECOMMENDATIONS:  1. Multivessel coronary artery disease, as detailed previously, and      now status post placement of two new drug eluting stents within the      circumflex and right coronary artery.  The patient tolerated this      well and our plan  will be aggressive risk factor modification.  He      needs followup lipid profile and liver function tests today.  We      can adjust Crestor as necessary.  Otherwise, I have recommended      continued dual antiplatelet therapy without interruption at this      point, given his newly-placed drug eluting stents.  I will plan to      see him back over the next three months.  2. Further plans to follow.     Jonelle Sidle, MD  Electronically Signed    SGM/MedQ  DD: 08/03/2007  DT: 08/03/2007  Job #: [id number]   cc:   [name], M.D.

## 2011-04-05 NOTE — Cardiovascular Report (Signed)
[name], [name]                 ACCOUNT NO.:  [id number]   MEDICAL RECORD NO.:  [id number]          PATIENT TYPE:  INP   LOCATION:  6531                         FACILITY:  [facility]   PHYSICIAN:  Veverly Fells. Excell Seltzer, MD  DATE OF BIRTH:  Apr 08, 1961   DATE OF PROCEDURE:  07/20/2007  DATE OF DISCHARGE:  07/21/2007                            CARDIAC CATHETERIZATION   PROCEDURE:  Percutaneous coronary intervention with percutaneous  transluminal coronary angioplasty and drug-eluting stent placed in the  mid right coronary artery and percutaneous transluminal coronary  angioplasty with drug-eluting stent placement in the mid left circumflex  coronary artery.   INDICATIONS:  [name] is a 50 year old gentleman who I studied this  morning in the outpatient cath lab.  He presented with increasing  exertional angina with a history of known CAD.  He was found to have  high-grade stenosis of the mid right coronary artery and left  circumflex.  Both were de novo lesions.  His stents are all widely  patent.  We brought him to the inpatient cath lab for percutaneous  coronary intervention.   Risks and indications for the procedure were reviewed with the patient.  Informed consent was obtained.  The patient was enrolled in the Taxus  Perseus trial for the left circumflex lesion.  Using normal sterile  technique, a 4-French sheath in the left femoral artery was changed out  for a 6-French sheath.  A 6-French JR-4 guide catheter was inserted.  Angiomax was used for anticoagulation.  The patient had been preloaded  with clopidogrel.  Once a therapeutic ACT was achieved, a Cougar  guidewire was passed easily beyond the area of stenosis.  The lesion was  predilated with 2.5 x 12-mm Maverick balloon up to 6 atmospheres.  Following balloon predilatation, a  2.75 x 12 mm Taxus stent was placed.  The stent was deployed at 14 atmospheres.  The stent was well expanded  with TIMI-3 flow following stent  placement.  The stent was then post  dilated with a 3 x 10 mm DuraStar balloon which was inflated to 18  atmospheres.  There is excellent stent expansion with  TIMI-3 flow in  the vessel and a starting stenosis of 80% reduced to 0% after stenting.   At that point we turned our attention to the left circumflex.   An XB 3.5 mm 6-French guide catheter was inserted.  Angiographic views  of the left circumflex were taken.  The same Cougar  guidewire was  passed beyond the lesion.  The same 2.5 x 12-mm balloon was inserted and  inflated to 10 atmospheres.  The lesion was a little more resistant and  required 10 atmospheres of pressure.  Following ballooning, there was  some good expansion and TIMI-3 flow in the vessel.  I elected to stent  with a 2.5 x 12 mm Taxus stent which the patient received through the  Taxus Perseus trial.  The stent was deployed at 14 atmospheres.  The  stent was well expanded with TIMI-3 flow.  Following stenting,  angiographic views after nitroglycerin demonstrated an excellent result  with a slightly oversized stent due to a nice step-up and step-down off  proximal and distal portions of the stent.  I elected not to post  dilate.  The patient tolerated the procedure well and had no  complications.   ASSESSMENT:  Successful percutaneous coronary intervention of the right  coronary artery and left circumflex with Taxus drug-eluting stents.   RECOMMENDATIONS:  With the multiple stents that [name] has, I would  recommend ongoing dual antiplatelet therapy lifelong as he tolerates.      Veverly Fells. Excell Seltzer, MD  Electronically Signed     MDC/MEDQ  D:  07/20/2007  T:  07/22/2007  Job:  [id number]   cc:   Jonelle Sidle, MD

## 2011-04-05 NOTE — Assessment & Plan Note (Signed)
[facility]                            CARDIOLOGY OFFICE NOTE   NAME:[name], [name]                        MRN:          [id number]  DATE:10/23/2007                            DOB:          1961/04/04    PRIMARY CARE PHYSICIAN:  [name], M.D.   REASON FOR VISIT:  Cardiac followup.   HISTORY OF PRESENT ILLNESS:  I saw [name] back in September. His  history is detailed in my previous notes. He subsequently underwent  rotator cuff surgery and tolerated this well from the perspective of his  cardiac status. He is not reporting any angina and his electrocardiogram  is normal today. He did have lipids obtained recently following an  increase in his Crestor to 20 mg daily and his LDL did come down to 95  although his triglycerides were up in the 830 range, total cholesterol  239 and HDL 26. AST and ALT were normal. We talked about omega 3 fish  oil supplements beginning at 2 grams and going to 4 grams daily in  addition to his Crestor. He has not been quite as active following his  surgery and hopefully he will be able to increase his activity level now  that he is back to work. He has not had any interruption in his aspirin  or Plavix.   ALLERGIES:  No known drug allergies.   CURRENT MEDICATIONS:  1. Aspirin 325 mg p.o. daily.  2. Plavix 75 mg p.o. daily.  3. Multivitamin daily.  4. Nexium 40 mg p.o. daily.  5. Crestor 20 mg p.o. daily.  6. Toprol XL 75 mg p.o. b.i.d.  7. Diovan HCT 320/35 mg p.o. daily.  8. Vicodin 7.5 mg p.r.n.   REVIEW OF SYSTEMS:  As described in the history of present illness.   PHYSICAL EXAMINATION:  Blood pressure 136/88, heart rate 90, weight 239  pounds up from 229 pounds last visit.  Overweight male in no acute distress.  HEENT:  Conjunctiva lids normal. Oropharynx clear.  NECK:  Supple. No elevated jugular venous pressure. No loud bruits, no  thyromegaly is noted.  LUNGS:  Clear. No labored breathing.  CARDIAC:  Reveals a regular rate and rhythm. No murmurs, rubs or  gallops.  ABDOMEN:  Soft, nontender.  EXTREMITIES:  Exhibit no pitting edema. Distal pulses 2+.  SKIN:  Warm and dry.  MUSCULOSKELETAL:  No kyphosis is noted.  NEUROPSYCHIATRIC:  The patient is alert and oriented x3. Affect is  normal.   IMPRESSION/RECOMMENDATIONS:  1. Coronary artery disease status post previous interventions to the      right coronary artery to due in-stent restenosis including recent      drug-eluting stent placement to the right coronary artery and      circumflex vessels earlier in the year. He continues on dual      antiplatelet therapy and is not having any angina. I encouraged him      to stay active and watch his diet and, I will see him back for      symptom review in the next 6 months.  2. Hyperlipidemia. He will plan to continue Crestor 20 mg daily. Omega      3 supplements will be added beginning at 2 grams daily and increase      to 4 grams daily. He will have a followup profile and liver      function tests in 12 weeks. He may need Zetia added to his regimen      as well.     Jonelle Sidle, MD  Electronically Signed    SGM/MedQ  DD: 10/23/2007  DT: 10/24/2007  Job #: [id number]   cc:   [name], M.D.

## 2011-04-05 NOTE — Op Note (Signed)
[name], [name]                 ACCOUNT NO.:  [id number]   MEDICAL RECORD NO.:  [id number]          PATIENT TYPE:  OIB   LOCATION:  5024                         FACILITY:  [facility]   PHYSICIAN:  Nadara Mustard, MD     DATE OF BIRTH:  [birth year]   DATE OF PROCEDURE:  08/23/2007  DATE OF DISCHARGE:                               OPERATIVE REPORT   PREOPERATIVE DIAGNOSIS:  Left shoulder rotator cuff tear and impingement  syndrome.   POSTOPERATIVE DIAGNOSIS:  Left shoulder rotator cuff tear and  impingement syndrome.   PROCEDURE:  Left shoulder arthroscopy with debridement of rotator cuff  tear, debridement of a SLAP lesion, debridement of subscapularis tear  and subacromial decompression.   SURGEON:  Nadara Mustard, MD.   ANESTHESIA:  General.   ESTIMATED BLOOD LOSS:  Minimal.   ANTIBIOTICS:  None.   DRAINS:  None.   COMPLICATIONS:  None.   DISPOSITION:  To PACU in stable condition.   INDICATIONS FOR PROCEDURE:  The patient is a 50 year old gentleman with  a left shoulder rotator cuff tear.  He has failed conservative care, has  pain with activities of daily living and presents at this time for  arthroscopic intervention.  Risks and benefits were discussed including  infection, neurovascular injury, persistent pain, need for additional  surgery.  The patient states he understands and wished proceed at this  time.   DESCRIPTION OF PROCEDURE:  The patient was brought to OR room #2 and  underwent a general anesthetic.  After adequate level of anesthesia was  obtained, the patient was place in the beach-chair position and the left  upper extremity was prepped using DuraPrep and draped into a sterile  field.  The scope was inserted from the posterior portal and anterior  portal was established with an outside-in technique with an 18-gauge  spinal.  Visualization showed a tear at the glenoid side of the rotator  cuff.  There was a good complete attachment of the rotator cuff  along  the humeral head.  This was a small focal full-thickness rotator cuff  tear which was debrided from both within the joint and from the  subacromial space.  The patient had a grade 1 SLAP lesion which was  debrided.  There was tearing of the subscapularis which was also  debrided and other partial-thickness tearing of rotator cuff which was  also debrided.  The vapor wand and the shaver were used for debridement.  After debridement, the vapor wand was used for hemostasis.  After  adequate debridement and hemostasis was obtained, the scope was removed  and then a scope was inserted from the posterior portal in the  subacromial space and a lateral portal was established.  The patient had  a  significant amount of bursitis in the subacromial space.  This was  debrided.  There was a hook type 3 acromion with AC arthropathy.  The [facility]  joint was debrided as well as a subacromial decompression.  The small  focal full-thickness tear of the rotator cuff along the glenoid was also  debrided.  The  remainder of the rotator cuff was intact.  The vapor wand  was used for hemostasis.  The instruments were removed.  The portals  were closed using 2-0 nylon.  The wounds were covered with Adaptic  orthopedic sponges, ABD dressing and Hypafix tape.  The patient was then  extubated and taking to PACU in stable condition.  Plan for 23-hour  observation.  Follow-up in the office in 2 weeks.      Nadara Mustard, MD  Electronically Signed     MVD/MEDQ  D:  08/23/2007  T:  08/23/2007  Job:  [id number]

## 2011-04-05 NOTE — Discharge Summary (Signed)
[name], [name]                 ACCOUNT NO.:  [id number]   MEDICAL RECORD NO.:  [id number]          PATIENT TYPE:  INP   LOCATION:  6531                         FACILITY:  [facility]   PHYSICIAN:  Tereso Newcomer, PA-C     DATE OF BIRTH:  04-08-1961   DATE OF ADMISSION:  07/20/2007  DATE OF DISCHARGE:  07/21/2007                               DISCHARGE SUMMARY   __________   PRIMARY CARE PHYSICIAN:  Dr. [name].  Primary cardiologist:  Dr.  Nona Dell.   REASON FOR ADMISSION:  Exertional anginal pectoris.   DISCHARGE DIAGNOSES:  1. Coronary artery disease.      a.     Status post TAXUS drug-eluting stent placement to mid-right       coronary artery and mid-circumflex this admission.      b.     History of anterior wall myocardial infarction status post       stent placement to left anterior descending.      c.     History of right coronary artery stenting with subsequent       restenting secondary to in-stent restenosis, and subsequently       cutting balloon angioplasty secondary to in-stent restenosis.  2. Preserved left ventricular function.  3. Hypertension.  4. Hyperlipidemia.  5. Obesity.  6. Ex-smoker.  7. Perseus trial.   PROCEDURES PERFORMED THIS ADMISSION:  1. Cardiac catheterization and percutaneous coronary intervention by      Dr. Tonny Bollman on July 20, 2007.  Please see his dictated      note for complete details.  Briefly, proximal right coronary artery      stent was patent with 30% in-stent restenosis, mid-right coronary      artery with 80% stenosis, mid-left circumflex 75% stenosis.  Patent      stents in left anterior descending with moderate  mid-left anterior      descending stenosis.  2. Percutaneous coronary intervention July 20, 2007 with TAXUS      Express II 2.75 x 12 mm to the mid-right coronary artery and a      TAXUS Element 2.75 x 12 mm to the left circumflex.   HISTORY:  The patient is a 50 year old male patient followed by  Dr.  Diona Browner, who presented to followup on July 11, 2007 with complaints  of progressive exertional angina pectoris.  It was decided to proceed  with elective cardiac catheterization to further evaluation.   The patient was brought into [facility] on July 20, 2007 for  elective cardiac catheterization.  He was noted to have severe right  coronary artery stenosis and moderate left circumflex stenosis.  The  patient subsequently underwent percutaneous coronary intervention as  outlined above to the mid-right coronary artery  and mid-left  circumflex.  He tolerated the procedure well, and did not have any  complications.  Dr. Excell Seltzer who performed the procedure recommended  aspirin and clopidogrel life-long.  The patient was enrolled in the  Perseus trial.  On the morning of July 21, 2007 he was found to be in  stable  condition, and ready for discharge to home.  His right femoral  arteriotomy site was stable, without bruit.   LABORATORY AND ANCILLARY DATA:  White count 6100, hemoglobin 13.3,  hematocrit 37.9, platelet count 157,000.  Sodium 138, potassium 3.6y,  glucose 91, BUN 16, creatinine 1.03.  Post-procedure CK-MB and troponin  I are negative.   DISCHARGE MEDICATIONS:  1. Aspirin 325 mg daily.  2. Plavix 75 mg daily.  3. Toprol XL 75 mg b.i.d.  4. Diovan/ hydrochlorothiazide 320/25 mg daily.  5. Crestor 10 mg daily.  6. Nexium 40 mg daily.  7. Multivitamin daily.  8. Calcium daily.  9. Mobic daily.   DISCHARGE ACTIVITIES:  He is to increase his activity slowly.  He may  walk up steps, he may shower.  No lifting, driving, or sexual activity  for 3 days.  He may return to work on July 25, 2007.  Wound care:  He should call our office for any signs of bruising, bleeding, or fever.   FOLLOW UP:  1. The patient will see Dr. Diona Browner in 2 weeks, the office will      contact him with an appointment.  2. He should see Dr. Nobie Putnam as directed.   TOTAL  PHYSICIAN CARE TIME:  Greater than 30 minutes for this discharge.      Tereso Newcomer, PA-C     SW/MEDQ  D:  07/21/2007  T:  07/22/2007  Job:  [id number]   cc:   [name], M.D.

## 2011-04-05 NOTE — Assessment & Plan Note (Signed)
[facility]                            CARDIOLOGY OFFICE NOTE   NAME:[name]                        MRN:          [id number]  DATE:07/11/2007                            DOB:          [birth year]    PRIMARY CARE PHYSICIAN:  [name], M.D.   REASON FOR VISIT:  Followup coronary artery disease.   HISTORY OF PRESENT ILLNESS:  I saw [name] back in March.  He has a  history of previous anterior wall myocardial infarction, status post  stent placement to the left anterior descending and subsequently the  right coronary artery.  He was noted to have in-stent restenosis  involving the drug-eluting stent within the right coronary artery and  this was ultimately treated with placement of a new drug-eluting stent.  I referred him for a diagnostic cardiac catheterization in April 2007,  given progressive angina.  And, he was noted to have a new 80% stenosis  within the right coronary artery stent site.  This was treated by Dr.  Riley Kill with a cutting-balloon angioplasty and symptomatically the  patient was doing well at his last visit.   Since that time,  he has unfortunately experienced progressive  exertional angina in a very typical fashion.  He has not used any  nitroglycerin for this but in retrospect feels like he probably should  have at certain times.  He is not experiencing any rest pain at this  time.  His electrocardiogram shows sinus rhythm with poor R wave  progression suggestive of previous anterior infarct as well as small  inferior Q waves which have been noted previously predominantly in lead  III.  At one point, we had [name] on Imdur at 30 mg daily, given  residual 60% disease within the left anterior descending, although  ultimately we weaned this off with stable symptomatology.  Also  complicating this decision is problems with erectile dysfunction.  He is  not using any erectile dysfunction medications, although this does  clearly influence decision for long-acting nitrates in his case.   Today, we talked about either trying to further advance his medications  which at this point unfortunately mean reinstituting his Imdur as his  heart rate and blood pressure are well controlled already, versus  proceeding to a diagnostic angiogram as I am suspicious he once again  has had problems with restenosis.  We have ultimately elected to proceed  with a diagnostic cardiac catheterization and this will be arranged for  next week.   ALLERGIES:  No known drug allergies.   PRESENT MEDICATIONS:  1. Multivitamin one p.o. daily.  2. Plavix 75 mg p.o. daily.  3. Aspirin 325 mg p.o. daily.  4. Nexium 40 mg p.o. daily.  5. Crestor 10 mg p.o. daily.  6. Calcium supplements.  7. Toprol XL 75 mg p.o. b.i.d.  8. Diovan HCTZ 320/25 mg p.o. daily.  9. Mobic.   REVIEW OF SYSTEMS:  As described in the history of present illness.  No  bleeding problems.   PHYSICAL EXAMINATION:  VITAL SIGNS:  Blood pressure 122/80, heart rate  73, weight 239 pounds.  GENERAL:  The is an overweight male in no acute distress.  No active  symptoms.  HEENT:  Normal.  NECK:  Supple.  No elevated jugular venous pressure.  No bruits.  No  thyromegaly is noted.  LUNGS:  Clear without labored breathing at rest.  CARDIAC:  Reveals a regular rate and rhythm with no loud murmur or  gallop.  ABDOMEN;  Soft, nontender.  Normoactive bowel sounds.  EXTREMITIES:  Show no significant pitting edema.  Distal pulses are 2+.  SKIN:  Warm and dry.  MUSCULOSKELETAL:  No kyphosis noted.  NEUROPSYCHIATRIC:  The patient is alert and oriented x3.   IMPRESSION:  Progressive exertional angina.  I am concerned about  restenosis based on the patient's history and symptoms.  After reviewing  the situation in detail, our plan is a diagnostic cardiac  catheterization for next week to reassess stent patency and baseline  native coronary artery disease progression.  His  last procedure was in  April 2007.  I discussed the case with Dr. Excell Seltzer and have scheduled the  case with him.  Further plans to follow in this regard.     Jonelle Sidle, MD  Electronically Signed    SGM/MedQ  DD: 07/11/2007  DT: 07/12/2007  Job #: [id number]   cc:   [name], M.D.

## 2011-04-05 NOTE — Assessment & Plan Note (Signed)
HEALTHCARE                       [place] CARDIOLOGY OFFICE NOTE   NAME:[name], [name]                        MRN:          [id number]  DATE:12/03/2008                            DOB:          1961/10/14    PRIMARY CARE PHYSICIAN:  [name], MD   REASON FOR VISIT:  Routine cardiac followup.   HISTORY OF PRESENT ILLNESS:  I saw Mr. Spradley back in June of last year.  He states that he has not been bothered by any progressive anginal  symptoms and continues to exercise at least 2 days a week at that gym.  His electrocardiogram today showed sinus rhythm with poor anterior R-  wave progression as noted previously and otherwise nonspecific ST-T wave  changes.  He states he has been able to lose about 8 pounds in the  beginning of the year and has more recently been paying attention to his  diet.  His lipid numbers do look better with significant improvement as  triglycerides down to 287, total cholesterol of 175, and LDL of 88.  He  seems to be tolerating Crestor and omega-3 supplements.  His AST and ALT  are mildly increased as noted previously.   ALLERGIES:  No known drug allergies.   MEDICATIONS:  1. Multivitamin one p.o. daily.  2. Plavix 75 mg p.o. daily.  3. Aspirin 325 mg p.o. daily.  4. Nexium 40 mg p.o. daily.  5. Toprol-XL 75 mg p.o. b.i.d.  6. Diovan/HCT 320/25 mg p.o. daily.  7. Omega-3 supplements 4 g daily.  8. Crestor 20 mg p.o. nightly.   REVIEW OF SYSTEMS:  As described in history of present illness.  No  myalgias.  He still has some left rotator cuff discomfort at times.  He  states he had flu back in the late fall months.  He has also had some  left lower back pain that is being evaluated by his primary care  physician.  Otherwise negative.   PHYSICAL EXAMINATION:  VITAL SIGNS:  Blood pressure is 110/78, heart  rate is 90, weight is 248 pounds.  This is an overweight male in no  acute distress.  HEENT:  Conjunctiva is  normal.  Oropharynx is clear.  NECK:  Supple.  No elevated jugular venous pressure.  No loud bruits, no  thyromegaly is noted.  LUNGS:  Clear, breathing at rest.  CARDIAC:  Regular rate and rhythm.  No pericardial rub or S3 gallop.  ABDOMEN:  Soft, nontender.  No active bowel sounds.  EXTREMITIES:  No significant edema.  Distal pulses are 2+.  SKIN:  Warm, dry.  MUSCULOSKELETAL:  No kyphosis noted.  NEUROPSYCHIATRIC:  The patient is alert and oriented x3.  Affect is  appropriate   IMPRESSION AND RECOMMENDATIONS:  1. Coronary artery disease status post prior interventions of the      right coronary artery, most recently placement of a drug-eluting      stent within the right coronary artery and circumflex vessels in      2008, in the setting of restenosis.  He is symptomatically stable      at  this time and will plan to continue medical therapy with      followup over the next 6 months.  I encouraged him to continue with      regular exercise and also pay attention to diet with a goal of more      weight loss.  I suspect this will help his lipid profile even      further.  2. Hyperlipidemia, better numbers on present therapy.  He seems to be      tolerating Crestor and omega-3 supplements.  We will plan a      followup lipid profile and liver function test around the time of      his next visit.     Jonelle Sidle, MD  Electronically Signed    SGM/MedQ  DD: 12/03/2008  DT: 12/04/2008  Job #: [id number]   cc:   [name], M.D.

## 2011-04-08 NOTE — Cardiovascular Report (Signed)
[place]  Patient:    [name], [name]                        MRN: [id number] Proc. Date: 01/15/01 Adm. Date:  [id number] Attending:  Daisey Must CC:         Cardiac Catheterization Laboratory  Desma Maxim, M.D.   Cardiac Catheterization  DATE OF BIRTH:  [birth year]  PRIMARY PHYSICIAN:  Dr. Desma Maxim.  PRIMARY CARDIOLOGIST:  Dr. Loraine Leriche Pulsipher.  PROCEDURE:  Left heart catheterization/coronary arteriography.  CARDIOLOGIST:  Rollene Rotunda, M.D.  INDICATIONS:  Evaluate a patient with unstable angina and previous stenting of the left anterior descending coronary artery.  DESCRIPTION OF PROCEDURE:  The left heart catheterization was performed via the right femoral artery.  The artery was cannulated using an anterior wall puncture.  A 6-French arterial sheath was inserted via the modified Seldinger technique.  A preformed Judkins and a pigtail catheter were utilized.  The patient tolerated the procedure well and left the laboratory in stable condition.  RESULTS: HEMODYNAMICS: LV:  111/28. AO:  110/68.  CORONARIES: 1. Left main coronary artery:  The left main coronary artery was normal. 2. Left anterior descending coronary artery:  The LAD had proximal 70%    stenosis in the stent after the first diagonal.  There was mid-long 50%    stenosis in a second stent. 3. Circumflex coronary artery:  The circumflex had a mid-30% stenosis. 4. Right coronary artery:  The right coronary artery had mid-25% stenosis    and diffuse luminal irregularities with a long 25% stenosis before the    PDA.  LEFT VENTRICULOGRAM:  The left ventriculogram was obtained in the RAO projection.  The overall ejection fraction was approximately 55% with moderate anterior akinesis.  CONCLUSION:  Moderate to severe in-stent restenosis.  PLAN:  I will review the films, to consider a percutaneous revascularization of the left anterior descending  coronary artery in the proximal stent, versus a perfusion study, to see if it is hemodynamically significant.DD:  01/15/01 TD:  01/16/01 Job: [id number]

## 2011-04-08 NOTE — Cardiovascular Report (Signed)
[place]  Patient:    [name], [name] Visit Number: [id number] MRN: [id number]          Service Type: CAT Location: [facility] 2899 [id number] Attending Physician:  Daisey Must Dictated by:   Daisey Must, M.D. [facility] Proc. Date: 10/04/01 Admit Date:  10/04/2001   CC:         Lilly Cove, M.D.  Cardiac Catheterization Lab   Cardiac Catheterization  PROCEDURE:  Left heart catheterization with coronary angiography and left ventriculography.  CARDIOLOGIST:  Daisey Must, M.D. [facility]  INDICATION:  [name] is a 50 year old male with history of previous anterior wall myocardial infarction in October of last year treated with acute stent placement x 2 in the mid LAD.  He subsequently underwent repeat PTCA of the stents in February of this year for restenosis.  A followup catheterization in June of this year showed no evidence of recurrent restenosis.  He as seen in the office yesterday complaining of progressive severe exertional chest pain relieved with nitroglycerin.  He was thus referred for cardiac catheterization.  CATHETERIZATION PROCEDURAL NOTE:  A 6-French sheath was placed in the right femoral artery.  Standard Judkins 6-French catheters were utilized.  Contrast was Omnipaque.  There were no complications.  RESULTS:  HEMODYNAMICS:  Left ventricular pressure 112/14, aortic pressure 104/70. There was no aortic valve gradient.  LEFT VENTRICULOGRAM:  There was moderate hypokinesis of anterior wall with mild focal akinesis of the apex.  Ejection fraction is calculated at 59%. There was no mitral regurgitation.  CORONARY ANGIOGRAPHY: (Right dominant).  Left main is normal.  Left anterior descending artery has a 30% stenosis in the mid vessel just after a large first diagonal branch and just proximal to the first of two stents in the mid vessel.  Within this stent is a 10% stenosis.  Further down in the mid LAD is a second stent which has  a 25% area of narrowing within the stent.  Just beyond this stent is a diffuse 30% stenosis in the mid to distal LAD.  There is a large first diagonal branch arising from the proximal LAD which has a 20% stenosis in the mid portion.  The left circumflex has a 30% stenosis in the mid vessel.  The circumflex gives rise to a small ramus intermediate, small first and second marginal branch, and a large third marginal branch.  The right coronary artery has a tubular 30% stenosis proximally, a 20 to 30% stenosis in the mid vessel, and a 20% stenosis in the distal vessel.  It gives rise to a normal size posterior descending artery and two small posterolateral branches.  IMPRESSIONS: 1. Left ventricular systolic function characterized by regional wall motion    abnormalities as described but overall preserved ejection fraction. 2. Mild coronary artery disease as described but no hemodynamically    significant lesions identified.  There are patent stents x 2 in the mid    left anterior descending artery.  PLAN:  The patient will be continue don medical therapy.Dictated by:   Daisey Must, M.D. [facility] Attending Physician:  Daisey Must DD:  09/24/01 TD:  10/04/01 Job: [id number]

## 2011-04-08 NOTE — Discharge Summary (Signed)
[place]. [facility]  Patient:    [name], [name]                        MRN: [id number] Adm. Date:  [id number] Disc. Date: 10/18/00 Attending:  Nelta Numbers Dictator:   Brita Romp, P.A.C. CC:         Lilly Cove, M.D.  Gerrit Friends. Dietrich Pates, M.D. [facility]  Daisey Must, M.D. [facility]   Discharge Summary  DISCHARGE DIAGNOSES: 1. Coronary artery disease, status post anterior myocardial infarction. 2. Status post percutaneous coronary intervention with two left anterior    descending stents. 3. Hyperlipidemia. 4. Hypertension. 5. History of tobacco abuse.  HOSPITAL COURSE:  [name] presented to the ER on November 25, with some left chest pain/tightness which was not relieved by three sublingual nitroglycerin. He was admitted by Dr. Dietrich Pates and scheduled for a relook catheterization the following morning.  On November 26, Dr. Gerri Spore took the patient to the catheterization lab. He found both stents patent, ejection fraction of 66%.  In the proximal portion of the LAD near the bend there was a 20 to 30% lesion.  In the mid-stent there was some haziness, but there was Timi 3 flow.  In the midportion of the circumflex showed 40% lesion.  In the right coronary artery there was diffuse lesion in the midarea of 20%.  Dr. Gerri Spore recommended medical treatment for acute coronary syndrome including aspirin, Plavix, and heparin.  On November 27, the patient underwent Adenosine Cardiolite.  Scan showed enlarged apical septal scar, no ischemia, and ejection fraction of 49%.  Since that time, the patient has remained stable and pain-free.  DISCHARGE MEDICATIONS: 1. Enteric-coated aspirin 325 mg q.d. 2. Toprol XL 50 mg q.d. 3. Lopid 600 mg b.i.d. 4. Accupril 10 mg q.d. 5. Wellbutrin 150 mg q.d. 6. Nitroglycerin 0.4 mg sublingual p.r.n. chest pain. 7. Plavix 75 mg q.d.  NOTE:  Dr. Wanita Chamberlain intention is to keep the patient on Plavix  for approximately two months and reevaluate at that time.  LABORATORY DATA:  White count 6.8, hemoglobin 14.4, hematocrit 39.8, platelets 180.  Sodium 136, potassium 4.1, chloride 102, CO2 23, BUN 15, creatinine 1.0, glucose 80.  PTT 46.  Cardiac enzymes were negative for MI.  However, troponin I peaked at 1.14 on November 25.  Chest x-ray revealed no acute disease.  EKG revealed sinus bradycardia with some sinus arrhythmia and old septal infarct.  There were no significant changes since prior examinations.  DISCHARGE INSTRUCTIONS: 1. The patient was advised to avoid lifting, driving, sexual activity, or    heavy exertion for two days. 2. The patient was advised to follow a low salt, low fat, and low cholesterol    diet. 3. The patient was advised to watch the catheterization site for bleeding,    drainage, swelling, or pain and to call the office if there were any of    these problems. 4. The patient was to follow up with Dr. Gerri Spore on December 17, as    previously scheduled. DD:  10/18/00 TD:  10/18/00 Job: [id number]

## 2011-04-08 NOTE — Assessment & Plan Note (Signed)
[facility]                              CARDIOLOGY OFFICE NOTE   NAME:[name], [name]                        MRN:          [id number]  DATE:07/03/2006                            DOB:          1961-11-06    PRIMARY CARE PHYSICIAN:  [name], M.D.   REASON FOR VISIT:  Follow up coronary artery disease.   HISTORY OF PRESENT ILLNESS:  I saw [name] back in April following an  intervention to the right coronary artery due to in-stent restenosis.  He  continues to do quite well since that time, without any significant angina  or limiting dyspnea on exertion.  Electrocardiogram is stable, showing  normal sinus rhythm at 82 beats per minute.  His predominant complaint is of  erectile dysfunction.  He is on a long-acting nitrate which we added back  around April, and I talked about this with him a bit.  We have obviously not  recommended any nitric oxide medications such as Viagra due to this.   ALLERGIES:  NO KNOWN DRUG ALLERGIES.   CURRENT MEDICATIONS:  1. Multivitamin one p.o. daily.  2. Plavix 75 mg p.o. daily.  3. Aspirin 325 mg p.o. daily.  4. Nexium 40 mg p.o. daily.  5. Crestor 10 mg p.o. daily.  6. Magnesium supplements.  7. Toprol-XL 75 mg p.o. b.i.d.  8. Diovan/HCTZ 320/25 mg p.o. daily.  9. Imdur 30 mg p.o. daily.   REVIEW OF SYSTEMS:  As described in the history of present illness.   PHYSICAL EXAMINATION:  VITAL SIGNS:  Blood pressure 122/82, heart rate 82,  weight 233 pounds which is down from 242 in April.  GENERAL:  The patient is comfortable at rest.  NECK:  Supple, without elevated jugular venous pressure, without bruits.  LUNGS:  Clear to auscultation.  CARDIAC:  Regular rate and rhythm without loud murmur or gallop.  ABDOMEN:  Soft with normoactive bowel sounds.  EXTREMITIES:  No significant pitting edema.   IMPRESSION AND RECOMMENDATIONS:  1. Coronary artery disease, status post previous anterior wall myocardial  infarction with stent placement to both the left anterior descending      and right coronary arteries.  His history has been complicated by in-      stent restenosis, and his is status post recent cutting balloon      angioplasty to the right coronary artery in April.  He is      symptomatically stable.  I have asked him to try a period of time off      of his Imdur to see if he has any significant angina.  If he is able to      discontinue the long-acting nitrate from his regimen, this may open up      more options for him from the perspective of therapy for erectile      dysfunction which is very troubling to him.  He will let us know about      symptoms, and we can proceed from there.  2. Otherwise, we will plan to follow up over the next six  months.                                Jonelle Sidle, MD    SGM/MedQ  DD:  07/03/2006  DT:  07/04/2006  Job #:  [id number]   cc:   [name], MD

## 2011-04-08 NOTE — H&P (Signed)
[name], [name] NO.:  [id number]   MEDICAL RECORD NO.:  [id number]                   PATIENT TYPE:  INP   LOCATION:                                       FACILITY:  [facility]   PHYSICIAN:  Pricilla Riffle, M.D. [facility]             DATE OF BIRTH:  11-02-1961   DATE OF ADMISSION:  02/03/2003  DATE OF DISCHARGE:                                HISTORY & PHYSICAL   HISTORY OF PRESENT ILLNESS:  The patient is a 50 year old male patient of  Dr. Salvadore Farber who has a known history of coronary artery disease.  He received a stent placement x2 to the mid-LAD in 2001.  He now presents  with substernal chest pain that occurs at rest, as well as with exertion.  He has had worsening dyspnea on exertion over the past several weeks.  The  patient is currently pain-free.  His last episode of pain was at rest  yesterday.  His symptoms were similar to the previous chest pain in 2001,  prior to his stent placement.  A re-look angiogram in March 2003, revealed normal left main, LAD with a 30%  stenosis within both stents.  There was a single large diagonal branch with  a 25% stenosis.  There was a 40% stenosis in the mid-circumflex, and the  right coronary artery had a tubular 40% stenosis proximally, with a 20% mid,  followed by a 20% distal stenosis.   ALLERGIES:  No known drug allergies.   MEDICATIONS:  1. Tegretol __________ mg.  2. Accupril 10 mg daily.  3. Nexium 40 mg daily.  4. A multivitamin daily.  5. Lipitor 20 mg daily.   PAST MEDICAL HISTORY:  1. Coronary artery disease as above.  2. History of dyslipidemia.  3. History of hepatic steatosis.  4. A family history of coronary artery disease.   FAMILY HISTORY:  Coronary artery disease in his mother.   SOCIAL HISTORY:  No tobacco or alcohol or illicit drug use.  One son.   PHYSICAL EXAMINATION:  VITAL SIGNS:  Weight 226 pounds, blood pressure  145/75, pulse 81.  HEENT:  Grossly normal.  NECK:   No carotid or supraclavicular bruits.  No jugular venous distention  or thyromegaly.  CHEST:  Clear to auscultation bilaterally.  No wheezing or rhonchi.  HEART:  A regular rate and rhythm.  No S3 or murmur.  ABDOMEN:  Good bowel sounds, nontender, nondistended.  No mass or bruits.  EXTREMITIES:  No lower extremity edema.  No femoral bruits.   ASSESSMENT:  1. Chest pain.  2. Coronary artery disease.  3. Hyperlipidemia.  4. Family history of coronary artery disease.   PLAN:  We will place the patient in the hospital, obtain electrolytes and  cardiac enzymes.  Place on Lovenox as well as IV nitroglycerin.  Plan for a  cardiac catheterization tomorrow morning.  The patient was seen today by Dr. Pricilla Riffle.      Guy Franco, P.A. [facility]                      Pricilla Riffle, M.D. [facility]    LB/MEDQ  D:  02/03/2003  T:  02/03/2003  Job:  [id number]   cc:   Salvadore Farber, M.D. [facility]

## 2011-04-08 NOTE — Discharge Summary (Signed)
NAMELY, [name]                 ACCOUNT NO.:  [id number]   MEDICAL RECORD NO.:  [id number]          PATIENT TYPE:  INP   LOCATION:  6529                         FACILITY:  [facility]   PHYSICIAN:  Gene Serpe, P.A. [facility]   DATE OF BIRTH:  [birth year]   DATE OF ADMISSION:  03/25/2005  DATE OF DISCHARGE:  03/26/2005                                 DISCHARGE SUMMARY   PROCEDURE:  Cypher stenting right coronary artery Mar 25, 2005.   REASON FOR ADMISSION:  [name] is a 50 year old male with known coronary  artery disease status post previous myocardial infarction and multiple  percutaneous interventions enrolled in CETP trial, who was referred for  elective coronary angiography as per study protocol.  Please refer to  dictated admission note for full details.   LABORATORY DATA:  Normal CBC, electrolytes, and renal function on admission,  marginally elevated glucose 103.   HOSPITAL COURSE:  The patient underwent elective coronary angiography  performed by Dr. Daisey Must (see report for full details) revealing  subtotal in stent stenosis of the proximal right coronary artery with,  otherwise, nonobstructive in stent restenosis of the two LAD stent sites,  nonobstructive first diagonal and circumflex disease.  LV function was  mildly depressed (45%).  Dr. Gerri Spore proceeded with successful Cypher  stenting of the subtotal proximal right coronary artery in stent lesion with  0% residual stenosis with no noted complications.  Dr. Gerri Spore recommended  treatment with aspirin and life long Plavix.  The patient will be kept for  overnight observation with tentative discharge planned for the morning.   DISCHARGE MEDICATIONS:  Plavix 75 mg daily (indefinitely), coated aspirin  325 mg daily, CETP study drug once daily, Toprol XL 50 mg daily, Nexium 40  mg b.i.d., Altace 10 mg daily, Nitrostat 0.4 mg as directed.   DISCHARGE INSTRUCTIONS:  No heavy lifting/driving x 2 days.  Maintain a low  fat, low cholesterol diet.  Call the office if there is any  swelling/bleeding of the groin.  The patient will follow up with Dr. Loraine Leriche  [facility] on Friday, May 12, at 9:45 a.m.   DISCHARGE DIAGNOSIS:  1.  Coronary artery disease progression.      1.  Status post Cypher stenting proximal right coronary artery secondary          to critical in stent restenosis, Mar 25, 2005.      2.  Patent left anterior descending stent site.      3.  Mild left ventricular dysfunction (ejection fraction 45%).      4.  Status post prior myocardial infarction and multiple percutaneous          interventions.  2.  Hypertension.  3.  Hyperlipidemia.  4.  Remote tobacco.      GS/MEDQ  D:  03/25/2005  T:  03/26/2005  Job:  [id number]

## 2011-04-08 NOTE — Discharge Summary (Signed)
[place]. [facility]  Patient:    [name], [name]                        MRN: [id number] Adm. Date:  [id number] Disc. Date: 01/18/01 Attending:  Daisey Must Dictator:   Joellyn Rued, P.A.C. CC:         Lilly Cove, M.D.             Daisey Must, M.D. [facility]                  Referring Physician Discharge [place]  DATE OF BIRTH:  01/23/1961  SUMMARY OF HISTORY:  [name] is a 50 year old white male who called the answering service on the evening of January 14, 2001, after he experienced an episode of substernal chest discomfort.  He described it as sharp without radiation and associated nausea or diaphoresis.  It was associated with mild shortness of breath.  He took a sublingual nitroglycerin which resolved his discomfort within less than five minutes.  He felt that these symptoms resembled his previous MI.  It was noted that he had recently come off his Plavix approximately one and a weeks prior to this episode.  He called our answering service and was referred to the emergency room for evaluation.  His history is notable for anterior myocardial infarction treated with two stents to the LAD in October of 2001.  He was also admitted for acute coronary syndrome on October 15, 2000, and a catheterization revealed a patent LAD, but haziness in the mid LAD stent with probable compatibility with prior thrombus formation.  He also had a 40% mid circumflex and 20% mid RCA and his EF was 66%.  He also has a history of hypertension, hyperlipidemia, depression, and remote tobacco use.  LABORATORY DATA:  Serial CKs and MBs were negative for myocardial infarction. The admission sodium was 138, potassium 4.0, BUN 18, creatinine 1.0, and glucose 91.  The SGOT and SGPT were slightly elevated at 44 and 61.  The hemoglobin was 14.3, hematocrit 41.2, normal indices, platelets 184, and WBC 6.1.  Post procedure, the hemoglobin was 14.9, hematocrit 43.1, normal indices,  platelets 170, WBC 6.0, sodium 138, potassium 3.7, BUN 18, and creatinine 1.1.  The chest x-ray did not reveal any active disease.  EKGs showed sinus bradycardia, V1-V3 T-wave inversion which was felt to be old, and nonspecific ST-T wave changes.  HOSPITAL COURSE:  [name] was admitted to the hospital and placed on heparin, aspirin, beta blockers, and sublingual nitroglycerin.  Enzymes ruled out myocardial infarction.  After reviewing subsequent presentations, cardiac catheterization was decided upon.  On January 15, 2001, Rollene Rotunda, M.D., performed cardiac catheterization.  According to his progress note, he had a 70% proximal LAD end stent post the diagonal branch.  He had a mid long 50% LAD lesion.  He had a mid 30% circumflex lesion, a mid 25% RCA lesion, and a long 25% RCA lesion prior to the PDA.  His EF was 55% with anterior akinesis.  It was felt that he had moderate end-stent restenosis and Rollene Rotunda, M.D., would review his films to decide upon further treatment. After reviewing the films with Bruce R. Juanda Chance, M.D., it was felt that angioplasty utilizing a cutting balloon would be the best option.  On January 17, 2001, this was performed by Everardo Beals. Juanda Chance, M.D.  According to his progress note, the proximal LAD end-stent and mid LAD end-stent restenosis both were  reduced from 70% to less than 10% without difficulty.  Post sheath removal and bed rest, he was ambulating in the halls without difficulty.  The catheterization site was intact.  Post procedure EKG did not show any acute changes.  After reviewing the chart, Bruce R. Juanda Chance, M.D., felt that he could be discharged home.  DISCHARGE DIAGNOSES: 1. Unstable angina. 2. Progressive coronary artery disease, status post angioplasty with a cutting    balloon to the proximal and mid left anterior descending end-stent    restenoses as previously described.  DISPOSITION:  Everardo Beals Juanda Chance, M.D., is to decide if he  should remain on the Plavix indefinitely.  DISCHARGE MEDICATIONS:  He is given a prescription for Plavix 75 mg q.d.  He was asked to resume his Lipitor 20 mg q.h.s., Accupril 10 mg q.d., Xanax 0.5 mg q.d., Zoloft 100 mg half of a tablet q.d., Toprol XL 50 mg q.d., coated aspirin 81 mg q.d., and sublingual nitroglycerin as needed.  ACTIVITY:  He was advised no lifting, driving, sexual activity, or heavy exertion for two days.  DIET:  Maintain a low-salt, low-fat, and low-cholesterol diet.  WOUND CARE:  If he has any problems with his catheterization site, he is instructed to call.  FOLLOW-UP:  He will see Daisey Must, M.D., on February 08, 2001, at 9:30 a.m. During this appointment, Dr. Gerri Spore needs to review his prior laboratory work in regards to his LFTs as the preadmission LFTs did show a slight elevation in his SGOT and SGPT.  He is to compare these with prior results. Bruce Elvera Lennox Juanda Chance, M.D., also wrote out a new prescription for Xanax 0.5 mg q.d. D:  01/18/01 TD:  01/18/01 Job: [id number]

## 2011-04-08 NOTE — Cardiovascular Report (Signed)
[name], SPECIALE NO.:  [id number]   MEDICAL RECORD NO.:  [id number]          PATIENT TYPE:  OUT   LOCATION:  CATH                         FACILITY:  [facility]   PHYSICIAN:  Carole Binning, M.D. LHCDATE OF BIRTH:  1961/08/29   DATE OF PROCEDURE:  DATE OF DISCHARGE:                              CARDIAC CATHETERIZATION   PROCEDURE PERFORMED:  1.  Left heart catheterization with coronary angiography and left      ventriculography.  2.  Intravascular ultrasound of the left circumflex coronary artery.   INDICATION:  The patient is a 50 year old male with history of coronary  artery disease, previous stents to the left anterior descending artery is  well as a stent to the right coronary artery in 2004.  He has a history of  previous anterior wall myocardial infarction.  He presented today for follow-  up catheterization as part of A CETP research study.  He had an  intravascular ultrasound of the left circumflex coronary artery performed  two years ago and has been treated with the study drug and presents today  for protocol-mandated follow-up cardiac catheterization.  However,  incidentally the patient has noted the recent onset of exertional angina  over the past two to four weeks.  He reports that when he goes walking he  will have to stop three times during his walk because of chest pain which is  then relieved with rest.   PROCEDURAL NOTE:  A 6-French sheath was placed in the right femoral artery.  The right coronary was imaged with a 5-French JR4 catheter.  Left  ventriculography was performed with a 5-French angled pigtail catheter.  Left coronary arteriography was performed a 6-French EBU 3.5 guiding  catheter.  Following completion diagnosis catheterization we proceed with  intravascular ultrasound.  Angiomax was administered per protocol.  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  A  Luge coronary guidewire was  advanced under fluoroscopic guidance into the  distal aspect of the left circumflex.  Intravascular ultrasound was then  performed utilizing the Galaxy 2 IVUS system and the Atlantis catheter.  Images were recorded on automatic pullback.  There were no complications.   RESULTS:   HEMODYNAMIC RESULTS:  Left ventricular pressure 125/20.  Aortic pressure  125/90.  There is no aortic valve gradient.   LEFT VENTRICULOGRAM:  There is moderate akinesis of the apical wall.  Ejection fraction estimated at 45%.  There is no mitral regurgitation.   CORONARY ARTERIOGRAPHY:  Left main is normal.   Left anterior descending artery has a stent in the proximal portion of the  mid vessel as well as in the mid portion of the mid vessel.  In the more  proximal stent there is a 30% stenosis within this stent.  In the stent mid  vessel there is less than 20% stenosis.  The LAD gives rise to a single  large diagonal branch.  There is a 27% stenosis in the proximal portion of  the diagonal branch.   Left circumflex gives rise to a small ramus intermedius and a large obtuse  marginal branch.  There is a 30% stenosis in the mid circumflex and diffuse  20% stenosis in the distal circumflex and extending into the third obtuse  marginal branch.   Right coronary artery is a dominant vessel.  There is a stent in the  proximal vessel.  Within this stent there is a focal 99% in-stent  restenosis.  The distal right coronary has a diffuse 30% stenosis followed  by a 50% stenosis in the distal vessel extending into the proximal portion  of the posterior descending artery.  The distal right coronary gives rise to  normal sized posterior descending artery which has a 50% stenosis  proximally.  There are two small posterolateral branches.  There is moderate  diffuse disease in the distal right coronary.   IMPRESSION:  1.  Mildly decreased left ventricular systolic function secondary to      previous anterior apical  wall myocardial infarction.  2.  Critical in-stent restenosis in the proximal right coronary artery.  3.  Patent stents in the left anterior descending artery with residual      nonobstructive disease in the left coronary arteries.   PLAN:  We will proceed with percutaneous coronary intervention of the right  coronary artery.      MWP/MEDQ  D:  03/25/2005  T:  03/25/2005  Job:  [id number]   cc:   [facility]   Cath Lab   [name], M.D.  [address], [address]  [place]  [place] [address]  Fax: [phone]

## 2011-04-08 NOTE — Cardiovascular Report (Signed)
[name]. [facility]  Patient:    [name], [name]                        MRN: [id number] Proc. Date: 05/08/01 Adm. Date:  [id number] Disc. Date: [id number] Attending:  Daisey Must CC:         Lilly Cove, M.D.  Cardiac Catheterization Laboratory   Cardiac Catheterization  PROCEDURES PERFORMED:  Left heart catheterization with coronary angiography and left ventriculography.  INDICATIONS:  The patient is a 50 year old male, with a history of previous anterior wall myocardial infarction, treated with primary angioplasty and stent placement x2 in the left anterior descending artery.  He subsequently had in-stent re-stenosis in February of this year, treated with balloon angioplasty.  He recently presented with recurrence of exertional shortness of breath.  A stress Cardiolite showed extensive anteroapical infarct with no evidence of ischemia.  However, the patient states that his symptoms were reminiscent of those prior to his last re-stenosis episode.  Based on this, we opted to proceed with cardiac catheterization to rule out recurrent restenosis.  DESCRIPTION OF PROCEDURE:  A 6 French sheath was placed in the right femoral artery.  Standard Judkins 6 French catheters were utilized.  Contrast was Omnipaque.  At the conclusion of the procedure, an AngioSeal vascular closure device was placed in the right femoral artery with good hemostasis.  There were no complications.  RESULTS:  HEMODYNAMICS:  Left ventricular pressure 114/10.  Aortic pressure 114/85. There was no aortic valve gradient.  LEFT VENTRICULOGRAM:  There is moderate akinesis of the anterior apical wall, otherwise normal wall motion.  Ejection fraction was calculated at 59% sever. No mitral regurgitation.  CORONARY ARTERIOGRAPHY:  (Right dominant).  Left main:  Normal.  Left anterior descending:  The left anterior descending artery has a 30% stenosis in the proximal vessel just  after the bifurcation of the first diagonal branch.  This 30% stenosis is just proximal to the proximal margin of the stent in the proximal LAD.  Within the stent is a less than 10% stenosis. In the mid LAD is a stent which has a 25% stenosis within this stent.  The LAD gives rise to a single large diagonal branch which has a 20% stenosis proximally.  Left circumflex:  The left circumflex gives rise to a small ramus intermedius and ends as a normal sized obtuse marginal branch.  There is a 30% stenosis in the mid circumflex.  Right coronary artery:  The right coronary artery is a dominant vessel.  The proximal vessel has a 30% stenosis, mid vessel has a 30% stenosis and the distal vessel has a diffuse 20% stenosis.  The distal right coronary artery gives rise to a normal sized posterior descending artery and two small posterolateral branches.  IMPRESSIONS: 1. Left ventricular systolic function in the lower range of normal with    anteroapical wall motion abnormality as described. 2. Mild nonobstructive coronary artery disease with patent stents x2 in the    left anterior descending artery.  No evidence of in-stent re-stenosis.  PLAN:  The patient will be continued on medical therapy. DD:  05/08/01 TD:  05/09/01 Job: [id number]

## 2011-04-08 NOTE — Assessment & Plan Note (Signed)
[facility]                            CARDIOLOGY OFFICE NOTE   NAME:[name], [name]                        MRN:          [id number]  DATE:01/31/2007                            DOB:          Nov 20, 1961    PRIMARY CARE PHYSICIAN:  [name], M.D.   REASON FOR VISIT:  Followup for coronary artery disease.   HISTORY OF PRESENT ILLNESS:  [name] returns today to the office,  stating that overall he has done very well.  He did have some chest pain  during a week when he had run out of Toprol XL.  Once he was able to  start back on this medicine, he states that he felt much better.  His  electrocardiogram today is stable, showing a sinus rhythm at 69 beats  per minute.  He  is otherwise denying any progressive exertional angina  or dyspnea.  He has been trying to do some exercise at the gym as well.  His last intervention was in April 2007, at which time he underwent  intervention to the right coronary artery.  Today we spoke about symptom  observation and general risk factor management.  Our plan is to continue  observation at this point.   ALLERGIES:  No known drug allergies.   CURRENT MEDICATIONS:  1. Multivitamin one p.o. q.d.  2. Plavix 75 mg p.o. q.d.  3. Aspirin 325 mg p.o. q.d.  4. Nexium 40 mg p.o. q.d.  5. Crestor 10 mg p.o. q.d.  6. Magnesium supplement.  7. Toprol XL 75 mg p.o. b.i.d.  8. Diovan/hydrochlorothiazide 320/25 mg p.o. q.d.   REVIEW OF SYSTEMS:  As described in the history of present illness.   PHYSICAL EXAMINATION:  VITAL SIGNS:  Blood pressure today 110/86, heart  rate 69, weight 227 pounds.  GENERAL:  The patient is comfortable, in no acute distress.  NECK:  No elevated jugular venous pressure, without bruits.  No  thyromegaly is noted.  LUNGS:  Clear without wheezing at rest.  HEART:  A regular rate and rhythm, without loud murmur or gallop.  EXTREMITIES:  No pitting edema.   IMPRESSION AND RECOMMENDATIONS:   History of coronary artery disease,  status post previous anterior wall myocardial infarction with stent  placement to both the left anterior descending and subsequently right  coronary artery.  He has had in-stent restenosis with subsequent cutting  balloon angioplasty of the right coronary artery in April of last year.  At this point I would plan to continue medical therapy.  He is now off  of Imdur, which was weaned following his last visit and not manifesting  any progressive angina.  I suspect that his chest pain described above  was related to the fact that he was out of Toprol for one week.  I will plan to see him back over the next six months, unless he  manifests any progressive symptoms on full medical therapy.     Gregory Sidle, MD  Electronically Signed    SGM/MedQ  DD: 01/31/2007  DT: 02/02/2007  Job #: [id number]   cc:  Bonne Bricelyn, M.D.

## 2011-04-08 NOTE — Discharge Summary (Signed)
[place]. [facility]  Patient:    [name], [name] Visit Number: [id number] MRN: [id number]          Service Type: MED Location: 2000 2031 01 Attending Physician:  Daisey Must Dictated by:   Lavella Hammock, P.A.-C. Admit Date:  02/13/2002 Disc. Date: 02/14/02   CC:         Lilly Cove, M.D.  Daisey Must, M.D. [facility]   Referring Physician Discharge [place]  DATE OF BIRTH:  06/16/1961  PROCEDURES: 1. Cardiac catheterization. 2. Coronary arteriogram. 3. Left ventriculogram.  HOSPITAL COURSE:  [name] is a 50 year old male with known coronary artery disease, who had stents placed in the past to his LAD.  He was seen in the office on February 13, 2002, for chest pain and admitted to the hospital for further evaluation and possible cath.  Being his enzymes were negative for MI and he had a cardiac catheterization on February 14, 2002.  The cardiac catheterization showed a normal left main and LAD with a mid stent and a distal stent, both with 30% in-stent restenosis.  Additionally, there was a 25% stenosis in the first diagonal.  The circumflex had a 40% stenosis, and the RCA had 40% and then two 20% lesions.  He EF was 50% with moderate anterior apical akinesis and no MR.  It was felt that the patient did not have critical obstructive coronary artery disease, and medical therapy was his best option.  Pending completion of bed rest, ambulation without difficulty, he will be considered stable for discharge on February 14, 2002, p.m.  His blood sugar upon admission was not fasting, but it was elevated at 164. We were not able to obtain a fasting blood sugar, but his a.m. blood sugar taken shortly after a clear liquid breakfast was in the 130s.  The patient is to follow up with Dr. Karilyn Cota for this in the near future and in the meantime, is to limit the added sugar in his diet.  LABORATORY VALUES:  Sodium 136, potassium 3.5, chloride 102, CO2 22, BUN  9, creatinine 1.0, glucose 164.  SGPT 43.  Other CMET values within normal limits.  Lipase 3.0, magnesium 1.8, INR 1.0, PTT 28.  Hemoglobin 14.6, hematocrit 41.2, WBC 6.5, platelets 12.6.  Chest x-ray:  Lung volumes are low with no edema or infiltrate.  Heart size is stable and within normal limits.  CBG at 8:45 a.m. shortly after a clear liquid breakfast 152.  DISCHARGE CONDITION:  Stable.  DISCHARGE DIAGNOSES:  1. Chest pain, no critical coronary artery disease by cath this admission.  2. Mild left ventricular dysfunction with an ejection fraction of 50% by cath     this admission.  3. Hyperglycemia, follow up with primary care physician.  4. Status post stent to the mid left anterior descending and to the distal     left anterior descending in October 2001, secondary to myocardial     infarction.  5. Thrombocytopenia secondary to either Reopro or heparin.  6. Family history of coronary artery disease.  7. Gastroesophageal reflux disease.  8. Hyperlipidemia.  9. Hypertension. 10. Hepatic steatosis. 11. Remote history of tobacco use.  DISCHARGE INSTRUCTIONS: 1. His activity level is to include no driving, sexual or strenuous activity    for two days. 2. He is to stick to a low fat and salt diet with no added sugar. 3. He is to call the office for bleeding, swelling, or drainage to the cath  site. 4. He is to follow up with Dr. Gerri Spore and keep his Monday appointment as    scheduled. 5. He is to follow up with Dr. Karilyn Cota and schedule a follow-up appointment    with him.  DISCHARGE MEDICATIONS:  1. Plavix 75 mg q.d.  2. Accupril 10 mg q.d.  3. Toprol XL 50 mg q.d.  4. Multivitamin q.d.  5. Magnesium supplement as prior to admission.  6. Niaspan 1500 mg q.h.s.  7. Coated aspirin 325 mg q.d.  8. Nexium 40 mg q.d.  9. Pravachol 40 mg q.d. 10. Nitroglycerin 0.4 mg p.r.n. Dictated by:   Lavella Hammock, P.A.-C. Attending Physician:  Daisey Must DD:  02/14/02 TD:   02/14/02 Job: [id number]

## 2011-04-08 NOTE — Discharge Summary (Signed)
[place]. [facility]  Patient:    [name], [name]                        MRN: [id number] Adm. Date:  [id number] Disc. Date: 09/14/00 Attending:  Nelta Numbers Dictator:   Lavella Hammock, P.A. CC:         Daisey Must, M.D. [facility]  Lilly Cove, M.D.   Discharge Summary  PROCEDURE: 1. Cardiac catheterization. 2. Coronary arteriogram. 3. Left ventriculogram. 4. PTCA and stent x 2 of one vessel.  HOSPITAL COURSE:  Mr. Hopes is a 50 year old male with no known history of coronary artery disease who had onset of pain approximately one hour prior to presentation to the hospital that was described as a tightness and associated with nausea, vomiting, and shortness of breath.  He had improvement with aspirin, morphine, IV nitroglycerin, heparin, and IV beta blockers.  His EKG was consistent with an acute MI and he was taken urgently to the cath lab. His cardiac catheterization showed a totaled LAD with Timi 0 flow.  This vessel received PTCA and two stents and the stenosis was reduced to 0 with Timi 3 flow.  Also distal to that he had a 70% stenosis that was reduced to 0 and a more distal 20% stenosis.  The first diagonal was large and had less than 20% stenosis.  The circumflex had a proximal 20 and a mid 40% stenosis. The RCA had a 25% and a 20% stenosis.  He had moderate anteroapical akinesis with an EF calculated at 51% and trace MR.  He tolerated the patient well and sheath was removed without difficulty.  However, he had thrombocytopenia that was felt to be secondary to Reopro and/or heparin.  These medications were stopped.  Over the next few days, he was followed and his platelet count began to slowly improve.  He had a cholesterol profile checked which showed a total cholesterol of 210, triglycerides 769, and HDL 28.  He had been on Zocor prior to admission, but this was changed to Lopid after these results were obtained. He was seen by  cardiac rehabilitation and ambulated without any difficulty. Risk factor reduction was also done with the patient counseled about stopping smoking and use of nitroglycerin as well as calling 911 and exercise guidelines. He was initially hyperglycemic, so a hemoglobin A1C was checked, but this was low at 4.3, and it was felt that the hyperglycemia had been secondary to the MI.  Over the next few days, he improved steadily and his platelets at discharge were 112,000.  He had no further episodes of chest pain and he was considered stable for discharge on September 13, 2000.  LABORATORY DATA:  Hemoglobin 15, hematocrit 40.5, WBC 7.1, and platelets 112. Sodium 140, potassium 4.1, chloride 101, CO2 28, BUN 12, creatinine 1.1, glucose 139 on admission, 94 at discharge.  Glycosolated hemoglobin 4.3. CK-MB peaked at 3956/482.7.  Chest x-ray; the lungs are clear, the heart is normal, and there is no bony abnormality.  CONDITION ON DISCHARGE:  Improved.  CONSULTING PHYSICIANS:  None.  COMPLICATIONS:  None.  DISCHARGE DIAGNOSES: 1. Acute anterior myocardial infarction, status post percutaneous transluminal    coronary angioplasty and stent x 2 to the left anterior descending. 2. Family history of coronary artery disease. 3. Ongoing tobacco use. 4. Hypertension. 5. Hyperlipidemia. 6. Hyperglycemia on admission felt secondary to myocardial infarction. 7. Thrombocytopenia secondary to either Reopro or heparin.  DISCHARGE INSTRUCTIONS: 1. His  activity level is to include no driving for a week and no sexual or    strenuous activity until cleared by M.D. 2. He is to stick to a low fat diet. 3. He is to call the office for bleeding, swelling, or drainage of the cath    site. 4. He is not to use tobacco. 5. He is to get a cholesterol test in six weeks. 6. He has a P.A. appointment on Wednesday, October 31, at 4:30 p.m. 7. He is to follow up with Dr. Karilyn Cota as needed.  DISCHARGE  MEDICATIONS: 1. Coated aspirin 325 mg q.d. 2. Plavix 75 mg q.d. for a month. 3. Toprol XL 50 mg q.d. 4. Nitroglycerin 0.4 mg p.r.n. 5. Lopid 600 mg b.i.d. 6. Wellbutrin 150 mg q.d. x 3 days and then b.i.d. 7. Accupril 10 mg p.o. q.d.  He is not to take Accuretic for now and he is not to take propanolol or Zocor. D:  09/13/00 TD:  09/13/00 Job: [id number]

## 2011-04-08 NOTE — Cardiovascular Report (Signed)
NAME:  GREER, KOEPPEN                           ACCOUNT NO.:  [id number]   MEDICAL RECORD NO.:  [id number]                   PATIENT TYPE:  INP   LOCATION:  6529                                 FACILITY:  [facility]   PHYSICIAN:  Veneda Melter, M.D. [facility]               DATE OF BIRTH:  October 09, 1961   DATE OF PROCEDURE:  02/04/2003  DATE OF DISCHARGE:                              CARDIAC CATHETERIZATION   PROCEDURE PERFORMED:  1. Left heart catheterization.  2. Left ventriculogram.  3. Selective coronary angiography.  4. Percutaneous transluminal coronary angioplasty and stent placement, mid     right coronary artery.  5. Percutaneous transluminal coronary angioplasty of the mid left anterior     descending artery.  6. Intravascular ultrasound of the left circumflex artery.  7. Intravascular ultrasound of the left anterior descending artery.   DIAGNOSES:  1. Two-vessel coronary artery disease.  2. Low normal left ventricular systolic function.  3. Unstable angina.  4. In-stent restenosis, mid left anterior descending artery.   HISTORY:  The patient is a 50 year old white male with coronary artery  disease who has previously suffered an anterior wall myocardial infarction  treated with stent placement in October 2001.  He has subsequently had  episodes of restenosis requiring repeat intervention.  He presents now with  crescendo angina over the past several months which has become unstable,  leading to his admission to the hospital.  He was stabilized medically,  ruled out for acute myocardial infarction, and presents for further  assessment.   TECHNIQUE:  Informed consent was obtained.  The patient was brought to the  catheterization lab.  A 6-French sheath was placed in the right femoral  artery using the modified Seldinger technique.  JL4 and JR4 6-French  catheters were then used to engage the left and right coronary arteries, and  selective angiography was performed in various  projections using manual  injections of contrast.  A 6-French pigtail catheter was then advanced in  the left ventricle, and left ventriculogram performed using power injections  of contrast.  Initial findings are as follows:   FINDINGS:  1. Left main trunk:  Large caliber vessel.  Long in its extent.  Has mild     irregularities.  2. LAD:  This is a medium caliber vessel that provides a large first     diagonal branch in the proximal segment.  The LAD then extends to the     apex.  There is evidence of a previously placed stent in the mid section     immediately following the first diagonal branch with a further stent in     the distal LAD.  The proximal LAD has mild irregularities.  The mid LAD,     in the area of previous stent placement, has restenosis of 70%.  There is     then a focal narrowing of 30% to 40% between  the 2 LAD stents.  The     distal stent has mild in-stent restenosis of 30%.  The apical LAD has     mild irregularities as well.  The first diagonal branch has mild disease     of 30%.  3. Left circumflex artery:  This begins as a large caliber vessel and     provides a major marginal branch in the mid section.  The left circumflex     system has mild diffuse disease of 30% to 40%.  4. Right coronary:  Dominant.  This is a large caliber vessel that provides     a posterior descending artery and several small posterior ventricular     branches in the terminal segment.  The right coronary artery has high-     grade sequential narrowings of 80% in the mid section.  There is mild     diffuse disease of 30% to 40% in the distal section encompassing to the     takeoff of the PDA.  The posterior descending artery has mild diffuse     disease of 30%.  The distal-most posterior ventricular branch is severely     diseased.  5. LV:  Normal end systolic and end diastolic dimensions.  Overall left     ventricular function is low normal.  Ejection fraction approximately 50%.      There is mild hypokinesis of the distal anterior and inferior walls.  No     mitral regurgitation is noted.   HEMODYNAMICS:  1. LV pressure is 130/10.  2. Aortic is 130/90.  3. LVEDP equals 20.   These findings were reviewed in detail with the patient.  We elected to  proceed with percutaneous intervention.  The patient was given Refludan re-  bolus to maintain ACT of approximately 220 seconds.  He also was given 600  mg of Plavix orally and had previously been on aspirin.  He had been started  on Refludan during hospital admission for thrombocytopenia that occurred in  the past with either heparin or ReoPro.  A 6-French JR4 guide catheter was  used to engage the right coronary artery.  A 0.014-inch Forte wire advanced  into the mid RCA.  This was used to size the lesion and the vessel diameter.  The wire was advanced in the distal RCA, and a 3.0 x 20-mm Taxus Express2  stent introduced.  This was carefully positioned in the mid RCA and deployed  at 12 atmospheres for 30 seconds.  Repeat angiography showed full coverage  of the lesion with residual narrowings of 20% to 30% in the mid section.  A  3.25 x 15-mm Quantum Maverick balloon was introduced and used to post dilate  the distal segment at 12 atmospheres for 30 seconds, and the proximal stent  at 16 atmospheres for 30 seconds.  Repeat angiography showed persistence of  mild residual disease of 20% in the area of the severe stenoses, and we  elected to follow this with a 3.5 x 8-mm Quantum Maverick balloon.  Two  inflations were performed in the mid and proximal segments of the stent at  16 atmospheres for 30 seconds.  This showed full apposition of the stent  with vessel walls and significant improvement in vessel lumen.  There was  mild residual narrowing of 10% in the distal lesion; however, this would be an acceptable result, and the stent and balloons appear to be well-sized to  the proximal distal reference vessel.  Final  angiography  was performed  confirming TIMI-3 flow in the RCA and no distal vessel damage.  Attention  was then turned to the left coronary artery.  The JR4 catheter was removed,  and a 6-French JL4 guide catheter was used to engage the left coronary  artery.  The Forte wire was advanced into the left circumflex artery, and  intravascular ultrasound performed using automated pullback.  This was  following the administration of  200 mcg of intracoronary nitroglycerin and  was for participation in the CETP cholesterol-lowering study.  The wire was  then repositioned in the LAD, and intravascular ultrasound performed of the  mid LAD to assess the in-stent restenosis.  This showed the mid LAD to be a  medium caliber vessel of 3.0 to 3.3 mm.  There was diffuse in-stent  restenosis with a lumen of less than 2 mm in the area of severe narrowing.  We elected to intervene on this using a 3.0 x 10-mm cutting balloon.  A  single inflation was performed within the distal aspect of the stent at 8  atmospheres for 30 seconds, and 2 additional inflations in the distal and  proximal segments of the stent at 10 atmospheres for 30 seconds.  This was a  3.0 x 13-mm Zeta stent that had been placed, and it  previously had been  dilated using a 3.25-mm cutting balloon.  Angiography at this point showed  an excellent result with only mild residual stenosis of 10% to 20%, no  vessel damage, and TIMI-3 flow through the LAD.  This was deemed an  acceptable result.  The guide catheter was removed, and the sheath secured  in position.  The patient tolerated the procedure well and was transferred  to the floor in stable condition.  Refludan will be continued for an  additional 2 hours and then discontinued, and the sheath pulled when the ACT  returns to normal.   FINAL RESULT:  1. Successful percutaneous transluminal coronary angioplasty and stent     placement, mid right coronary artery, with reduction of 80%  narrowing to     10% with placement of a 3.0 x 20-mm Taxus2 stent.  2. Successful percutaneous transluminal coronary angioplasty of the mid left     anterior descending artery with reduction of 70% in-stent restenosis to     less than 20% using a 3.0-mm cutting balloon.   ASSESSMENT AND PLAN:  The patient is a 50 year old gentleman with aggressive  coronary artery disease.  He will be continued on Plavix for a minimum of 6  months' time in light of placement of the drug-eluting stent.  Aggressive  risk factor modification will be pursued, and we will attempt to place the  patient in the CETP study in which he will receive high-dose statin with  adjuvant therapy per protocol.                                               Veneda Melter, M.D. [facility]    NG/MEDQ  D:  02/04/2003  T:  02/04/2003  Job:  [id number]   cc:   Wilson Singer, M.D.  [address], [address] [place]  [place] [address]  Fax: [phone]   Salvadore Farber, M.D. [facility]

## 2011-04-08 NOTE — Cardiovascular Report (Signed)
[place]. [facility]  Patient:    [name], [name]                        MRN: [id number] Proc. Date: 09/09/00 Adm. Date:  [id number] Attending:  Nelta Numbers CC:         Lilly Cove, M.D.  Gerrit Friends. Dietrich Pates, M.D. [facility]  Cardiac Catheterization Lab   Cardiac Catheterization  PROCEDURES PERFORMED: 1. Left heart catheterization with coronary angiography and left    ventriculography. 2. Percutaneous transluminal coronary angioplasty with stent placement in the    proximal left anterior descending artery. 3. Direct stent placement in the mid left anterior descending artery.  INDICATIONS:  Mr. Vangieson is a 50 year old male who presented to the emergency room with the acute onset of substernal chest pain.  An EKG was consistent with an acute anterior myocardial infarction.  He was brought emergently to the cardiac catheterization laboratory.  CATHETERIZATION PROCEDURAL NOTE:  A 7-French sheath was placed in the right femoral artery.  Standard Judkins 6-French catheters were utilized.  Contrast was Hexabrix.  There were no complications.  CATHETERIZATION RESULTS:  Hemodynamics:  Left ventricular pressure 126/22; aortic pressure 126/94. There was no aortic valve gradient.  Left ventriculogram:  There was moderate akinesis of the anteroapical wall. Ejection fraction was calculated at 51%.  There is trace mitral regurgitation.  Coronary arteriography (right dominant): 1. The left main is normal. 2. The left anterior descending artery is 100% occluded just after a large    first diagonal branch.  There is TIMI-0 flow into the distal vessel.  After    reperfusion, there was found to be a 70% stenosis in the mid LAD and a 20%    stenosis in the distal LAD.  The first diagonal itself had diffuse luminal    irregularities. 3. The left circumflex had luminal irregularities in the proximal vessel and a    40% stenosis in the mid vessel.  The  circumflex gives rise to a small ramus    intermedius, small first and second marginal branches, and a large third    marginal branch. 4. The right coronary artery is a dominant vessel.  There is a diffuse 25%    stenosis extending from the proximal to mid vessel.  The distal vessel has    diffuse luminal irregularities.  The distal right coronary artery gives    rise to a normal sized posterior descending artery, a normal sized first    posterolateral branch, and a small second posterolateral branch.  There is    no collateral filling seen of the distal LAD.  IMPRESSIONS: 1. Mildly decreased left ventricular systolic function secondary to an acute    anteroapical myocardial infarction. 2. One-vessel coronary artery disease characterized by 100% occlusion of the    proximal LAD.  PLAN:  Percutaneous intervention of the LAD.  See below.  Following completion of the diagnostic catheterization, we proceeded directly with coronary intervention.  The patient was treated with ReoPro.  He had been given heparin in the emergency room, and no additional heparin was required as his starting ACT was over 250 seconds.  We used a 7-French JL4 guiding catheter and a Hi-Torque Floppy wire.  Reperfusion of the LAD was successfully achieved with passing the guide wire beyond the occlusion into the distal LAD. Reperfusion time was approximately two hours from the onset of chest pain. PTCA was then performed of the proximal LAD utilizing a 3.0  x 15-mm Maverick balloon inflated to six atmospheres for two inflations.  We then deployed a 3.0 x 13-mm Penta stent at 14 atmospheres.  Angiographic images revealed an excellent result at this site with 0% residual stenosis and restoration of TIMI-3 flow.  After reperfusion of the LAD was performed, we also discovered a 70% stenosis in the mid LAD.  After we stented the proximal LAD, we proceeded with direct stent placement across the lesion in the mid LAD.  We  placed a 3.0 x 13-mm Penta stent and deploying this at 12 atmospheres.  Angiographic images after stent deployment in the mid LAD revealed 0% residual stenosis with TIMI-3 flow.  COMPLICATIONS:  None.  RESULTS: 1. Successful PTCA with stent placement in the proximal LAD reducing 100%    occlusion with TIMI-0 flow to 0% residual with TIMI-3 flow. 2. Successful direct stent placement in the mid LAD reducing a 70% stenosis    to 0% residual with TIMI-3 flow.  PLAN:  The patient will be continued on ReoPro for 12 hours.  Plavix will be administered for four weeks.  The patient needs aggressive risk factor modification. DD:  09/09/00 TD:  09/09/00 Job: [id number]

## 2011-04-08 NOTE — Discharge Summary (Signed)
NAMEKENROY, [name] NO.:  [id number]   MEDICAL RECORD NO.:  [id number]          PATIENT TYPE:  OIB   LOCATION:  6529                         FACILITY:  [facility]   PHYSICIAN:  [name], M.D.  DATE OF BIRTH:  08-30-1961   DATE OF ADMISSION:  03/03/2006  DATE OF DISCHARGE:  03/04/2006                                 DISCHARGE SUMMARY   PRIMARY CARE PHYSICIAN:  Dr. [name].   PRIMARY CARDIOLOGIST:  Dr. Nona Dell.   PRINCIPAL DIAGNOSIS:  Coronary artery disease.   OTHER DIAGNOSES:  1.  Hypertension.  2.  Hyperlipidemia.  3.  Obesity.  4.  Remote tobacco abuse with a 40 pack year history, quitting in 2001.   ALLERGIES:  HEPARIN AND BIVALIRUDIN.   PROCEDURE:  Cutting balloon angioplasty secondary to in-stent restenosis in  the right coronary artery.   HISTORY OF PRESENT ILLNESS:  A 50 year old white male with prior history of  CAD dating back to September 09, 2000 with previous anterior myocardial  infarction and stenting of the LAD, with subsequent stenting of the right  coronary artery in March of 2004, who has had difficulty with in-stent  restenosis in the past requiring cutting balloon angioplasty of the LAD in  2002 and drug eluting stent placement in the RCA in May of 2006. He was  recent seen in the clinic by Dr. Diona Browner on February 27, 2006 at which time he  has recurrent complaints of exertional chest pain and shortness of breath.  Arrangements were subsequently made for cardiac catheterization on March 03, 2006.   HOSPITAL COURSE:  He underwent left heart catheterization on March 03, 2006  revealing patent stent in the LAD with 80% stenosis in the previously placed  mid RCA stent. Film was reviewed by Dr. Riley Kill and decision was made to  pursue cutting balloon angioplasty. This was performed successfully. It was  noted during catheterization on his arterial line that he was experiencing  pulsus paradoxus and thus a 2D echocardiogram  was performed on March 03, 2006 revealing an EF of 30-35% with diffuse hyperkinesis and mid/distal  septal apical akinesis without evidence of pericardial effusion. Post  procedure CK and MB were negative with mild elevation of troponin at 0.11.  An ECG has been without acute changes. He has been ambulating in the  hallways without recurrent chest discomfort or limitation and as a result is  being discharged home today in satisfactory condition.   Of note, he had an elevated blood sugar this morning of 139 which was  fasting. We have recommended he have close follow up with Dr. Nobie Putnam  regarding questionable new diagnosis of diabetes.   DISCHARGE LABS:  Hemoglobin 14.2, hematocrit 40.3, white blood cell count  7.0 thousand, platelets 166,000. Sodium 140, potassium 3.7, chloride 101,  CO2 29, BUN 10, creatinine 1.1, glucose 139, calcium 8.7. CK 126, MB 3.5,  troponin I 0.11. D-dimer 0.44.   DISPOSITION:  The patient is being discharged home today in good condition.   FOLLOW UP PLANS AND APPOINTMENTS:  He is asked to follow  up with Dr.  Nobie Putnam in approximately 2-4 weeks. He will be contacted by our office for  a follow up appointment with Dr. Diona Browner in approximately 2 weeks.   DISCHARGE MEDICATIONS:  1.  Aspirin 325 mg daily.  2.  Plavix 75 mg daily.  3.  Imdur 30 mg daily.  4.  Tobrex 75 mg b.i.d.  5.  Nexium 40 mg daily.  6.  Crestor 10 mg daily.  7.  Diovan/HCT 320/25 mg daily.  8.  Atarax 25 mg daily.  9.  Loratadine 10 mg daily.  10. Nitroglycerin 0.4 mg sublingual p.r.n. chest pain.   OUTSTANDING LAB STUDIES:  None.  DURATION OF DISCHARGE ENCOUNTER:  40 minutes including physician time.      Ok Anis, NP    ______________________________  [name], M.D.    CRB/MEDQ  D:  03/04/2006  T:  03/04/2006  Job:  [id number]   cc:   [name], M.D.  Fax: [phone]   Jonelle Sidle, M.D. [facility]  [address]  [place]  [place] [address]

## 2011-04-08 NOTE — Op Note (Signed)
[place]  Patient:    [name], [name]                        MRN: [id number] Proc. Date: 10/16/00 Adm. Date:  [id number] Attending:  Nelta Numbers CC:         Daisey Must, M.D. [facility]  Lilly Cove, M.D.   Operative Report  DATE OF BIRTH:  [birth year]  REFERRING PHYSICIAN:  Lilly Cove, M.D.  CARDIOLOGIST:  Daisey Must, M.D.  PROCEDURES PERFORMED: 1. Left heart catheterization. 2. Ventriculography.  DIAGNOSES: 1. Patent stent x 2. 2. Nonobstructive coronary artery disease. 3. Status post acute coronary syndrome with positive troponins.  HISTORY:  [name] is a 49 year old white male with multiple cardiac risks factors.  The patient is status post anteroapical transmural myocardial infarction on September 09, 2000.  The patient underwent emergent PCI/stent, performed by Dr. Loraine Leriche [name] with a stent placed into the proximal LAD and a second stent placed into the mid LAD.  The patient subsequently had done well and is not being admitted with recurrent substernal chest pain at rest in the last 24 hours.  The patient was found to have mildly elevated troponins with a troponin level of 1.14.  The patient is now being referred for diagnostic catheterization to access his coronary anatomy.  DESCRIPTION OF PROCEDURE:  After informed consent was obtained, the patient was brought to the catheterization laboratory.  The right groin was sterilely prepped and draped.  Lidocaine 1% was used to infiltrate the right groin and a 6 French arterial sheath was placed using the modified Seldinger technique. Subsequently, a 6 [place] and JR4 catheters were used to engage the left and right coronary arteries.  Selective angiography was performed in various projections using manual injections of contrast.  After selective coronary angiography, ventriculography was then performed.  A 6 French angled pigtail catheter was advanced  through the femoral artery and placed in the left ventricle.  Appropriate left-sided hemodynamics were obtained.  Subsequently, ventriculography was performed in a single plane RAO projection using power injection of contrast.  The pigtail catheter was then pulled back and assessment was made of an aortic valve by gradient.  At the termination of the case the catheters and sheaths were removed and manual pressure applied until adequate hemostasis was achieved.  The patient tolerated the procedure well and was transferred to the floor in stable condition.  FINDINGS:  HEMODYNAMICS:  Aortic pressure 100/62 mmHg.  Left ventricular pressure 100/14 mmHg.  VENTRICULOGRAPHY:  Ejection fraction 66%.  No segmental wall motion abnormalities.  SELECTIVE CORONARY ANGIOGRAPHY:  Left main coronary artery is a large caliber vessel with no evidence of flow-limiting coronary artery disease.  The left anterior descending artery bifurcates in its proximal segment in the LAD proper and a very large first diagonal branch.  Just beyond the takeoff of this first diagonal branch a stent is visualized with TIMI-3 flow.  There is no evidence of obstructive lesion within the stent.  There appears to be an area just proximal to the stent with a possible 20-30% stenosis but it is on a bend of the proximal LAD and this area does not appear worse in any other views.  The stent in the mid LAD is patent but there is a small area of haziness noted, possibly consistent with a prior thrombus formation.  Flow in the distal LAD is normal, although the distal LAD is diffusely diseased without  focal any flow-limiting lesions.  Left circumflex coronary artery is a large caliber vessel giving rise to two obtuse marginal branches.  There is approximately a 40% stenosis in the mid vessel.  Right coronary artery is a large caliber vessel that terminates in two posterolateral branches and the posterior descending artery.   Throughout the mid segment there is a diffuse 20% stenosis but no focal lesions.  CONCLUSIONS: 1. Status post non-Q-wave myocardial infarction/acute coronary syndrome. 2. Patent stents x 2 in the proximal and mid left anterior descending. 3. Normal left ventricular systolic function.  RECOMMENDATIONS:  The results have been discussed with Dr. Loraine Leriche [name]. Angiographic images were reviewed with Dr. Chales Abrahams.  Although there appears to be no obstructive lesion, it does appear that there is a small area of haziness in the most distal stent consistent with an acute coronary syndrome/thrombus formation.  The patient did discontinue his Plavix several days ago and the plan is to reinstitute Plavix indefinitely in conjunction with an addition of 24 hours of intravenous heparin.  A functional study with Cardiolite imaging will be performed tomorrow to rule out flow-limiting disease that may be underestimated in the angiographic images. DD:  10/16/00 TD:  10/16/00 Job: [id number]

## 2011-04-08 NOTE — Cardiovascular Report (Signed)
NAME:  AAREN, ATALLAH NO.:  [id number]   MEDICAL RECORD NO.:  [id number]           PATIENT TYPE:   LOCATION:                                 FACILITY:   PHYSICIAN:  Carole Binning, M.D. [facility] DATE OF BIRTH:   DATE OF PROCEDURE:  03/25/2005  DATE OF DISCHARGE:                              CARDIAC CATHETERIZATION   PROCEDURE PERFORMED:  Percutaneous coronary intervention with placement of a  drug-eluting stent in the proximal right coronary artery.   INDICATIONS:  The patient is a 50 year old male with history of advanced  coronary artery disease. He has a history of previous anterior wall  myocardial infarction and two stents placed in the left anterior descending  artery with repeat procedures for in-stent restenosis. Most recently in 2004  he had a drug-eluting stent placed in the proximal right coronary artery.  Over the past few weeks he has had new-onset of exertional angina. Cardiac  catheterization performed today revealed a 99% in-stent restenosis in the  proximal right coronary artery. The patient was therefore brought back to  the laboratory for percutaneous coronary attention.   PROCEDURE NOTE:  A preexisting 6-French sheath in the right femoral artery  was exchanged over wire for a sterile 6-French sheath. The patient had been  started on Angiomax during his diagnostic catheterization and this was  continued throughout the intervention. We used a 6-French JR-4 guiding  catheter. An Asahi soft coronary guidewire is advanced under fluoroscopic  guidance into the distal right coronary. We then performed PTCA of the  proximal vessel with a 3.0 x 15-mm Quantum balloon inflated to 12  atmospheres. Following this, we positioned a 3.0 x 18 mm CYPHER drug-eluting  stent across the area of stenosis and deployed the stent a 20 atmospheres.  Intracoronary nitroglycerin was administered. Final angiographic images were  obtained showing patency right coronary with  0% residual stenosis at the  stent site and TIMI-3 flow into the distal vessel.   COMPLICATIONS:  None.   RESULTS:  Successful percutaneous transluminal coronary angioplasty with  placement of a drug-eluting stent the proximal right coronary artery. A 99%  in-stent restenosis was reduced to 0% residual with TIMI-3 flow.   PLAN:  Angiomax will be discontinued. The patient will be continued on his  current therapy with aspirin and Plavix which we anticipate will be a  lifelong therapy. He will also benefit from continued aggressive risk factor  modification.      MWP/MEDQ  D:  03/25/2005  T:  03/25/2005  Job:  [id number]   cc:   [name], M.D.  [address], [address]  [place]  [place] [address]  Fax: [phone]

## 2011-04-08 NOTE — Procedures (Signed)
[name], [name]                 ACCOUNT NO.:  [id number]   MEDICAL RECORD NO.:  [id number]          PATIENT TYPE:  OUT   LOCATION:  SLEEP CENTER                 FACILITY:  [facility]   PHYSICIAN:  Clinton D. Maple Hudson, M.D. DATE OF BIRTH:  1961/05/30   DATE OF STUDY:                              NOCTURNAL POLYSOMNOGRAM   REFERRING PHYSICIAN:  Dr. Jetty Duhamel.   DATE OF STUDY:  January 27, 2006.   INDICATION FOR STUDY:  Hypersomnia with sleep apnea.   EPWORTH SLEEPINESS SCORE:  15/24.   BMI:  31.7.   WEIGHT:  240 pounds.   HOME MEDICATIONS:  Listed and reviewed.   SLEEP ARCHITECTURE:  Total sleep time 367 minutes with sleep efficiency 83%.  Stage I was 13%, stage II 74%, stages III and IV 3%, REM 10% of total sleep  time. Sleep latency 16 minutes, REM latency 266 minutes, awake after sleep  onset 62 minutes, arousal index of 14.6. Bedtime medications included  hydroxyzine, Toprol and Xanax.   RESPIRATORY DATA:  A split study protocol: Apnea/hypopnea index (AHI, RDI)  52.3 obstructive events per hour indicating severe obstructive sleep  apnea/hypopnea syndrome before C-PAP. This reflected 123 obstructive apneas  and 12 hypopneas before C-PAP. Events were positional, mostly noted while  supine with most sleep reported supine, AHI 37.6. REM AHI 1.6 per hour. C-  PAP was titrated to 11 CWP, AHI 0 per hour. A medium ResMed full-face ultra  mirage mask was used with heated humidifier.   OXYGEN DATA:  Moderate snoring with oxygen desaturation to a nadir of 77%.  Mean oxygen saturation after C-PAP control was 95-96% on room air.   CARDIAC DATA:  Normal sinus rhythm.   MOVEMENT/PARASOMNIA:  Leg jerks were noted with little effect on sleep.   IMPRESSION/RECOMMENDATIONS:  1.  Severe obstructive sleep apnea/hypopnea syndrome, AHI 52.3 per hour with      all events while supine. Moderate snoring with oxygen desaturation to      77%.  2.  Successful C-PAP titration to 11 CWP, AHI 0 per  hour. A medium ResMed      full-face ultra mirage mask was used with heated humidifier.      Clinton D. Maple Hudson, M.D.  Diplomate, Biomedical engineer of Sleep Medicine  Electronically Signed     CDY/MEDQ  D:  01/29/2006 17:28:33  T:  01/30/2006 21:12:54  Job:  [id number]

## 2011-04-08 NOTE — Cardiovascular Report (Signed)
[place]. [facility]  Patient:    [name], [name] Visit Number: [id number] MRN: [id number]          Service Type: MED Location: 2000 2031 01 Attending Physician:  Lenoria Farrier Dictated by:   Daisey Must, M.D. [facility] Proc. Date: 02/14/02 Admit Date:  02/13/2002 Discharge Date: 02/14/2002   CC:         Desma Maxim, M.D.  Cardiac Catheterization Laboratory   Cardiac Catheterization  PROCEDURES PERFORMED: Left heart catheterization with coronary angiography and left ventriculography.  INDICATIONS: The patient is a 50 year old male with a history of previous anterior wall myocardial infarction with stent placement x2 in the mid left anterior descending artery. He has undergone repeat percutaneous intervention for in-stent re-stenosis. Since then, he has undergone followup catheterizations for chest pain, which have not shown any evidence of recurrent disease. He presented again to the office yesterday with progressive substernal chest pain and was admitted and referred for cardiac catheterization.  DESCRIPTION OF PROCEDURE: A 6 French sheath was placed in the right femoral artery. Standard Judkins 6 French catheters were utilized. Contrast was Omnipaque. There were no complications.  RESULTS:  HEMODYNAMICS: Left ventricular pressure 120/14, aortic pressure 120/84.  There was no aortic valve gradient.  LEFT VENTRICULOGRAM: There is moderate akinesis of the anterior apical wall. Ejection fraction calculated at 50%. There is no mitral regurgitation.  CORONARY ARTERIOGRAPHY: (Right dominant).  Left main is normal.  Left anterior descending artery has a stent in the mid vessel. There is a 20-30% stenosis within this stent. Further down in the distal portion of the mid vessel is a second stent which also has a 30% stenosis within it. The LAD gives rise to a single large diagonal branch which has a 25% stenosis.  Left circumflex gives  rise to a small ramus intermediate, very small first and second marginal branches, and a large third marginal branch. There is a 40% stenosis in the mid circumflex extending into the third marginal branch.  Right coronary artery is a dominant vessel. There is a tubular 30-40% stenosis in the proximal vessel, 20% in the mid vessel and 20% in the distal vessel. The distal right coronary artery gives rise to a normal sized posterior descending artery and a normal sized posterolateral branch.  IMPRESSIONS: 1. Mildly decreased left ventricular systolic function secondary to prior    anterior wall myocardial infarction. 2. Patent stents in the mid left anterior descending artery as described with    no significant re-stenosis 3. Mild to moderate residual disease in the right coronary artery and left    circumflex, which does not appear to be obstructed.  PLAN: The patient will be continued on medical therapy. Dictated by:   Daisey Must, M.D. [facility] Attending Physician:  Lenoria Farrier DD:  02/14/02 TD:  02/15/02 Job: [id number]

## 2011-04-08 NOTE — Discharge Summary (Signed)
   NAME:  RYKIN, ROUTE                           ACCOUNT NO.:  [id number]   MEDICAL RECORD NO.:  [id number]                   PATIENT TYPE:  INP   LOCATION:  6529                                 FACILITY:  [facility]   PHYSICIAN:  Pricilla Riffle, M.D. [facility]             DATE OF BIRTH:  1961-04-23   DATE OF ADMISSION:  02/03/2003  DATE OF DISCHARGE:                           DISCHARGE SUMMARY - REFERRING   CONTINUATION   DISCHARGE MEDICATIONS:  1. The patient was discharged home on Plavix 75 mg daily.  2. Enteric coated aspirin 325 mg p.o. daily.  3. Toprol XL 50 mg daily.  4. Accupril 10 mg p.o. daily.  5. Nexium 40 mg p.o. daily.  6  Multivitamin daily.  1. Lipitor 20 mg q.h.s.  2. Nitroglycerin 0.4 mg PRN.   ACTIVITY:  Patient is advised no lifting, driving, sexual activity or heavy  exertion for two days.  He may return to work on Friday, light duty and  return to work on full duty on Monday.   DIET:  Patient is advised a low salt, fat, and cholesterol diet,  particularly avoid potatoes, pasta and breads.   WOUND CARE:  Patient is advised if he has any problems with his  catheterization site to please call us.   FOLLOW UP:  The research nurse will be contacting him at home for follow up  appointment.  The patient will see Dr. Samule Ohm in April for follow up appointment.  At his  follow up appointment a review of cardiac risk factor modifications should  be performed as well as compliance with medications and optimal lipid  control.  If the patient has not enrolled in cardiac rehabilitation this  should also be considered.     Joellyn Rued, P.A. [facility]                    Pricilla Riffle, M.D. [facility]    EW/MEDQ  D:  02/05/2003  T:  02/05/2003  Job:  [id number]

## 2011-04-08 NOTE — Cardiovascular Report (Signed)
[place]. [facility]  Patient:    [name], [name]                        MRN: [id number] Proc. Date: 01/17/01 Adm. Date:  [id number] Attending:  Daisey Must CC:         Desma Maxim, M.D.  Daisey Must, M.D. [facility]  Cardiopulmonary Lab   Cardiac Catheterization  PROCEDURE:  Percutaneous intervention.  CARDIOLOGISTEverardo Beals Juanda Chance, M.D. [facility]  CLINICAL HISTORY:  [name] is 50 years old.  In October, he had an anterior wall myocardial infarction treated with placement of tandem stents in the proximal and mid LAD by Dr. Gerri Spore.  He was readmitted with recurrent chest pain that he says is very similar to his infarct pain.  He was studied a few days ago by Dr. Antoine Poche and was found to have moderate in-stent restenosis. After reviewing the films with Dr. Riley Kill and myself and Dr. Antoine Poche, we made the decision to proceed with intervention on the in-stent restenosis.  We were not certain this was tight enough to cause rest symptoms, however.  PROCEDURE IN DETAIL:  The procedure was performed via the right femoral artery with arterial sheath and 7-French JL4 catheter.  The patient had a history of thrombocytopenia with his intervention in October, and it is not clear if this was related to heparin or ReoPro.  Did not have access to any anti-heparin antibodies.  For this reason, we elected to use Refluden without a IIb/IIIa inhibitor for anticoagulation.  The patient was given the standard bolus dose of Refluden which prolonged ACT to 244 seconds.  We used a long loose wire and crossed the lesions in the proximal and mid LAD without difficulty.  We used a 3.25 x 15 mm cutting balloon and performed three inflations in the proximal stent and outside the proximal stent up to 9 atmospheres for 45 seconds.  We than approached the in-stent restenosis in the mid vessel and performed a total of three inflations up to 5 atmospheres for 45 seconds.   Repeat diagnostic study was then performed through the guiding catheter.  The patient tolerated the procedure well and left the laboratory in satisfactory condition.  RESULTS:  Initially, the stenosis within the stent in the proximal LAD was estimated at 70%.  Following cutting balloon angioplasty, this improved to less than 20%.  The stenosis in the stent in the mid LAD was initially estimated at 70%. Following cutting balloon angioplasty, this improved to less than 20%.  There was no dissection seen.  CONCLUSION: Successful cutting balloon angioplasty of tandem in-stent restenoses in the proximal and mid left anterior descending artery with improvement in the proximal stenosis from 70% to less than 20% and improvement in the mid stenosis from 70% to less than 20%.  DISPOSITION:  The patient is admitted for further observation. DD:  01/17/01 TD:  01/17/01 Job: [id number]

## 2011-04-08 NOTE — Discharge Summary (Signed)
NAME:  [name], [name]                           ACCOUNT NO.:  [id number]   MEDICAL RECORD NO.:  [id number]                   PATIENT TYPE:  INP   LOCATION:  6529                                 FACILITY:  [facility]   PHYSICIAN:  Pricilla Riffle, M.D. [facility]             DATE OF BIRTH:  [birth year]   DATE OF ADMISSION:  02/03/2003  DATE OF DISCHARGE:  02/05/2003                           DISCHARGE SUMMARY - REFERRING   HISTORY OF PRESENT ILLNESS:  The patient is a 50 year old white male who  presents with substernal chest discomfort at rest and with exertion for the  preceding two weeks.  He is currently pain free.  He has also noted  increased dyspnea on exertion for two weeks.  He feels his symptoms are  similar to 2001 when he had his prior stent.  His history is notable for  stenting X2 to the LAD, hepatic steatosis, hyperlipidemia, early family  history.   LABORATORY DATA:  Admission hemoglobin 14.4 and hematocrit 40.3.  Normal  indices.  Platelet count 159,000.  WBC 6.4, PT 13.1, PTT 26, sodium 137,  potassium 3.8, BUN 10, creatinine 1.0, glucose 116.  Normal liver function  tests.  CK's and troponin's were negative for myocardial infarction.  Fasting lipids on February 04, 2003 showed a total cholesterol of 175,  triglycerides 356, HDL 32, VLDL 71, LDL 72, PT 13.1, PTT 26.  EKG's show  normal sinus rhythm, probable old delayed R wave progression, probable old  inferior myocardial infarction.   HOSPITAL COURSE:  The patient was admitted to [facility]  and placed on intravenous nitroglycerin and Lovenox.  Cardiac  catheterization was performed on February 04, 2003 by Dr. Chales Abrahams.  According to  his progress note he had an EF of 50%, distal anterior hypokinesis, distal  inferior apical hypokinesis.  He had a mild irregular T's. In the left main,  70% mid in stent restenosis of the LAD, 30 to 40% mid circumflex, 80% mid  RCA, 30 to 40% distal RCA.  He underwent angioplasty to the  LAD, in stent  restenosis and high angioplasty stenting to the RCA.  The circumflex and the  LAD underwent _____.  Dr. Chales Abrahams recommended Plavix at least for six months,  possibly indefinitely.  He was also enrolled into the CETP study in regards  to his lipids.  Dr. Chales Abrahams also recommended seeing a nutritionist as an  outpatient.  On February 05, 2003 after review, Dr. Juanda Chance felt that he could  be discharged home with resuming his Plavix and aspirin.  Dr. Samule Ohm noted  he should be on Plavix 75 mg every day for at least 12 months. It was also  noted at the time of discharge that the patient stated that he had run out  of his aspirin approximately one week ago and has not been taking this.   DISCHARGE DIAGNOSES:  1. Unstable angina.  2.  Progressive coronary artery disease status post angioplasty stenting as     previously described.  3. Hyperlipidemia.  4. Noncompliance with aspirin therapy.    DISPOSITION:  Dictation ended at this point.     Joellyn Rued, P.A. [facility]                    Pricilla Riffle, M.D. [facility]    EW/MEDQ  D:  02/05/2003  T:  02/05/2003  Job:  [id number]   cc:   Wilson Singer, M.D.  [address], [address]  [place]  [place] [address]  Fax: [phone]   Salvadore Farber, M.D. [facility]

## 2011-04-08 NOTE — Cardiovascular Report (Signed)
NAMEASHLEY, MONTMINY NO.:  [id number]   MEDICAL RECORD NO.:  [id number]          PATIENT TYPE:  OIB   LOCATION:  2807                         FACILITY:  [facility]   PHYSICIAN:  Rollene Rotunda, M.D.   DATE OF BIRTH:  1961-10-09   DATE OF PROCEDURE:  03/03/2006  DATE OF DISCHARGE:                              CARDIAC CATHETERIZATION   PRIMARY CARE PHYSICIAN:  [name], M.D.   PROCEDURES:  Left heart catheterization/coronary arteriography.   INDICATIONS:  Evaluated the patient with unstable angina.  He had previous  stenting of his LAD and required re-intervention of this.  He subsequently  had stenosis of his RCA with placement of a TAXUS stent.  He had restenosis  with placement in the stent of a Cypher stent.  He now presents with  recurrent chest pain and accelerated pattern (unstable angina).   PROCEDURE NOTE:  Left heart catheterization performed using the left femoral  artery. The artery was cannulated using antral puncture.  A #6 French  arterial sheath was inserted via modified Seldinger technique.  Preformed  Judkins and pigtail catheter were utilized.  The patient tolerated the  procedure well.   RESULTS/HEMODYNAMICS:  LV 99/14, AO 99/72.   CORONARIES:  Coronaries:  Left main was normal.  The LAD was moderately,  diffusely diseased throughout it's course.  There was a long, mid 60%  stenosis.  These were very narrow caliber vessels.  The first diagonal was  large but still narrow caliber with proximal 30% stenosis.  The circumflex  in the AV groove had diffuse luminal irregularities.  There was a 50%  stenosis before this vessel terminated as a large mid obtuse marginal. This  mid obtuse marginal was essentially normal.  The right coronary artery was a  large dominant vessel.  It had proximal stents.  There was mid 80% stenosis  within the stent.  There were diffuse luminal irregularities throughout the  course of the vessel.  The PDA was large with  diffuse luminal  irregularities.  There were two small posterolateral's with diffuse luminal  irregularities.   LEFT VENTRICULOGRAM:  The left ventriculogram was obtained in the REF  projection.  The EF was 50% with anterior akinesis.   CONCLUSION:  Severe right coronary artery restenosis.   PLAN:  The patient will have percutaneous revascularization of this per Dr.  Riley Kill.           ______________________________  Rollene Rotunda, M.D.     JH/MEDQ  D:  03/03/2006  T:  03/03/2006  Job:  [id number]   cc:   [name], M.D.  Fax: [phone]

## 2011-04-29 ENCOUNTER — Encounter: Payer: Self-pay | Admitting: Cardiology

## 2011-05-01 ENCOUNTER — Other Ambulatory Visit: Payer: Self-pay | Admitting: Cardiology

## 2011-05-05 ENCOUNTER — Encounter: Payer: Self-pay | Admitting: Cardiology

## 2011-05-05 ENCOUNTER — Ambulatory Visit (INDEPENDENT_AMBULATORY_CARE_PROVIDER_SITE_OTHER): Payer: BC Managed Care – PPO | Admitting: Cardiology

## 2011-05-05 VITALS — BP 146/84 | HR 84 | Wt 254.0 lb

## 2011-05-05 DIAGNOSIS — R079 Chest pain, unspecified: Secondary | ICD-10-CM

## 2011-05-05 DIAGNOSIS — I2510 Atherosclerotic heart disease of native coronary artery without angina pectoris: Secondary | ICD-10-CM

## 2011-05-05 DIAGNOSIS — I10 Essential (primary) hypertension: Secondary | ICD-10-CM

## 2011-05-05 DIAGNOSIS — E785 Hyperlipidemia, unspecified: Secondary | ICD-10-CM

## 2011-05-05 MED ORDER — VALSARTAN-HYDROCHLOROTHIAZIDE 320-25 MG PO TABS: 1.0000 | ORAL_TABLET | Freq: Every day | ORAL | Status: DC

## 2011-05-05 NOTE — Patient Instructions (Addendum)
**Note De-identified Draken Farrior Obfuscation** Your physician recommends that you return for lab work in: 6 months, just before next office visit.  Your physician recommends that you continue on your current medications as directed. Please refer to the Current Medication list given to you today.  Your physician recommends that you schedule a follow-up appointment in: 6 months   

## 2011-05-05 NOTE — Progress Notes (Signed)
Clinical Summary [name] is a 50 y.o.male presenting for followup. He was seen in September 2011.  He does endorse occasional exertional angina, also more prominent dyspnea on exertion, however he has gained approximately 50 pounds since last evaluation. He was previously separated from his wife, however they have reunited. He states that he initially had lost a fair bit of weight as he was not eating well and under a lot of stress. He has also not been exercising as regularly.  He reports compliance with his medications, although did discontinue Norvasc. He thought that it had perhaps contributed to more ED.  Today we discussed the importance of diet, exercise, and weight loss. His ECG is reviewed below.   No Known Allergies  Current outpatient prescriptions:aspirin 325 MG tablet, Take 325 mg by mouth daily.  , Disp: , Rfl: ;  esomeprazole (NEXIUM) 40 MG capsule, Take 40 mg by mouth daily.  , Disp: , Rfl: ;  metoprolol (TOPROL-XL) 50 MG 24 hr tablet, Take 50 mg by mouth 2 (two) times daily. 1 tab am 2 tabs pm , Disp: , Rfl: ;  Multiple Vitamin (MULTIVITAMIN) tablet, Take 1 tablet by mouth daily.  , Disp: , Rfl:  nitroGLYCERIN (NITROSTAT) 0.4 MG SL tablet, Place 0.4 mg under the tongue every 5 (five) minutes as needed.  , Disp: , Rfl: ;  Omega-3 Fatty Acids (FISH OIL) 1000 MG CAPS, Take 2 capsules by mouth 2 (two) times daily.  , Disp: , Rfl: ;  prasugrel (EFFIENT) 10 MG TABS, Take 10 mg by mouth daily.  , Disp: , Rfl: ;  rosuvastatin (CRESTOR) 20 MG tablet, Take 20 mg by mouth at bedtime.  , Disp: , Rfl:  valsartan-hydrochlorothiazide (DIOVAN HCT) 320-25 MG per tablet, Take 1 tablet by mouth daily., Disp: 30 tablet, Rfl: 6;  DISCONTD: amLODipine (NORVASC) 2.5 MG tablet, Take 2.5 mg by mouth daily.  , Disp: , Rfl: ;  DISCONTD: DIOVAN HCT 320-25 MG per tablet, TAKE ONE TABLET BY MOUTH EVERY DAY, Disp: 30 each, Rfl: 0;  DISCONTD: chlorpheniramine-HYDROcodone (TUSSIONEX) 10-8 MG/5ML LQCR, , Disp: , Rfl:   DISCONTD: metoprolol (TOPROL-XL) 50 MG 24 hr tablet, Take 75 mg by mouth 2 (two) times daily.  , Disp: , Rfl:   Past Medical History  Diagnosis Date  . Coronary atherosclerosis of native coronary artery     Remote stent LAD, subsequent stents RCA with restenosis, DES to RCA and circ 8/08 in setting of restenosis, DES LAD and DES RCA 3/11  . Hyperlipidemia   . Essential hypertension, benign   . Hepatic steatosis   . Obstructive sleep apnea   . MI (myocardial infarction)     Anterior    Social History [name] reports that he has quit smoking. His smoking use included Cigarettes. He has never used smokeless tobacco. [name] reports that he does not drink alcohol.  Review of Systems No palpitations or syncope, no cough or hemoptysis, no melena or hematochezia. Otherwise negative.  Physical Examination Filed Vitals:   05/05/11 1541  BP: 146/84  Pulse: 84  No acute distress, no active chest pain.  HEENT: Conjunctiva and lids normal, oropharynx clear.  Neck: Supple, no elevated jugular venous pressure.  Lungs: Clear to auscultation, nonlabored.  Cardiac: Regular rate and rhythm, no S3 gallop, no loud systolic murmur. PMI indistinct.  Skin: Warm and dry.  Extremities: No pitting edema, distal pulses one plus, no groin hematoma.  Musculoskeletal: No gross deformities.  Neuropsychiatric: Alert and oriented x3, affect  appropriate.   ECG Sinus rhythm at 73 beats per minute, borderline low voltage, decreased anterior R-wave progression consistent with prior infarct.  Studies Echocardiogram 05/07/2010: - Left ventricle: The cavity size was normal. Wall thickness was  increased in a pattern of mild LVH. Systolic function was low  normal. The estimated ejection fraction was 50%. Akinesis and  scarring of the mid-distal anteroseptal myocardium; consistent  with infarction. Moderate hypokinesis of the apical myocardium.  - Atrial septum: No defect or patent foramen ovale was  identified.   Problem List and Plan

## 2011-05-05 NOTE — Assessment & Plan Note (Signed)
Plan at this point is to continue observation and medical therapy. We discussed the importance of diet and exercise, also weight loss. I suspect that his conditioning has worsened, contributing at least partially to some of his shortness of breath. He is having intermittent exertional angina. If he does not improve with conservative measures, we can certainly discuss the situation further.

## 2011-05-05 NOTE — Assessment & Plan Note (Signed)
Blood pressure is up today. He states that he has been checking this closely at home, typically with much better control and systolics in the 120s. No changes made today. I suspect weight loss and exercise will help.

## 2011-05-05 NOTE — Assessment & Plan Note (Signed)
Continue statin therapy. Followup fasting lipid profile and liver function tests for his next visit.

## 2011-05-09 ENCOUNTER — Encounter: Payer: Self-pay | Admitting: Cardiology

## 2011-05-11 ENCOUNTER — Other Ambulatory Visit: Payer: Self-pay | Admitting: *Deleted

## 2011-05-11 MED ORDER — ESOMEPRAZOLE MAGNESIUM 40 MG PO CPDR: 40.0000 mg | DELAYED_RELEASE_CAPSULE | Freq: Every day | ORAL | Status: DC

## 2011-05-21 ENCOUNTER — Other Ambulatory Visit: Payer: Self-pay | Admitting: Cardiology

## 2011-09-01 LAB — PROTIME-INR
INR: 1
Prothrombin Time: 12.9

## 2011-09-01 LAB — COMPREHENSIVE METABOLIC PANEL
ALT: 37
AST: 24
Albumin: 4.1
Alkaline Phosphatase: 53
BUN: 19
CO2: 30
Calcium: 9.5
Chloride: 103
Creatinine, Ser: 1.03
GFR calc Af Amer: >60
GFR calc non Af Amer: >60
Glucose, Bld: 109 — ABNORMAL HIGH
Potassium: 4.3
Sodium: 139
Total Bilirubin: 0.9
Total Protein: 7.1

## 2011-09-01 LAB — CBC
HCT: 42.7
Hemoglobin: 15
MCHC: 35.1
MCV: 89.5
Platelets: 179
RBC: 4.77
RDW: 13.4
WBC: 5.3

## 2011-09-01 LAB — APTT: aPTT: 29

## 2011-09-02 LAB — CARDIAC PANEL(CRET KIN+CKTOT+MB+TROPI)
CK, MB: 2.3
CK, MB: 2.4
CK, MB: 2.5
Relative Index: 1.5
Relative Index: 1.9
Relative Index: 2.3
Total CK: 111
Total CK: 127
Total CK: 153
Troponin I: 0.02
Troponin I: 0.03
Troponin I: 0.03

## 2011-09-02 LAB — CBC
HCT: 37.9 — ABNORMAL LOW
HCT: 40.2
Hemoglobin: 13.3
Hemoglobin: 14.2
MCHC: 35.2
MCHC: 35.3
MCV: 89.2
MCV: 89.2
Platelets: 157
Platelets: 160
RBC: 4.24
RBC: 4.51
RDW: 13.6
RDW: 14.1 — ABNORMAL HIGH
WBC: 6.1
WBC: 6.3

## 2011-09-02 LAB — BASIC METABOLIC PANEL
BUN: 16
BUN: 19
CO2: 25
CO2: 29
Calcium: 8.6
Calcium: 8.6
Chloride: 101
Chloride: 102
Creatinine, Ser: 0.91
Creatinine, Ser: 1.03
GFR calc Af Amer: >60
GFR calc Af Amer: >60
GFR calc non Af Amer: >60
GFR calc non Af Amer: >60
Glucose, Bld: 81
Glucose, Bld: 91
Potassium: 3.1 — ABNORMAL LOW
Potassium: 3.6
Sodium: 136
Sodium: 138

## 2011-11-01 LAB — LIPID PANEL
Cholesterol: 182 mg/dL (ref 0–200)
HDL: 30 mg/dL — ABNORMAL LOW (ref >39)
LDL Cholesterol: 73 mg/dL (ref 0–99)
Total CHOL/HDL Ratio: 6.1 Ratio
Triglycerides: 393 mg/dL — ABNORMAL HIGH (ref <150)
VLDL: 79 mg/dL — ABNORMAL HIGH (ref 0–40)

## 2011-11-09 ENCOUNTER — Other Ambulatory Visit: Payer: Self-pay

## 2011-11-09 ENCOUNTER — Encounter (HOSPITAL_COMMUNITY): Payer: Self-pay

## 2011-11-09 ENCOUNTER — Encounter (HOSPITAL_COMMUNITY): Payer: Self-pay | Admitting: Emergency Medicine

## 2011-11-09 ENCOUNTER — Emergency Department (INDEPENDENT_AMBULATORY_CARE_PROVIDER_SITE_OTHER): Payer: BC Managed Care – PPO

## 2011-11-09 ENCOUNTER — Emergency Department (HOSPITAL_COMMUNITY)
Admission: EM | Admit: 2011-11-09 | Discharge: 2011-11-09 | Disposition: A | Discharge status: Nursing Home (Includes ICF) | Payer: BC Managed Care – PPO | Source: Home / Self Care

## 2011-11-09 ENCOUNTER — Emergency Department (HOSPITAL_COMMUNITY)
Admission: EM | Admit: 2011-11-09 | Discharge: 2011-11-09 | Disposition: A | Discharge status: Home / Self Care | Payer: BC Managed Care – PPO | Attending: Emergency Medicine | Admitting: Emergency Medicine

## 2011-11-09 DIAGNOSIS — Z9861 Coronary angioplasty status: Secondary | ICD-10-CM | POA: Insufficient documentation

## 2011-11-09 DIAGNOSIS — I252 Old myocardial infarction: Secondary | ICD-10-CM | POA: Insufficient documentation

## 2011-11-09 DIAGNOSIS — J811 Chronic pulmonary edema: Secondary | ICD-10-CM

## 2011-11-09 DIAGNOSIS — R070 Pain in throat: Secondary | ICD-10-CM | POA: Insufficient documentation

## 2011-11-09 DIAGNOSIS — I2510 Atherosclerotic heart disease of native coronary artery without angina pectoris: Secondary | ICD-10-CM | POA: Insufficient documentation

## 2011-11-09 DIAGNOSIS — R6889 Other general symptoms and signs: Secondary | ICD-10-CM

## 2011-11-09 DIAGNOSIS — R0602 Shortness of breath: Secondary | ICD-10-CM | POA: Insufficient documentation

## 2011-11-09 DIAGNOSIS — R0989 Other specified symptoms and signs involving the circulatory and respiratory systems: Secondary | ICD-10-CM

## 2011-11-09 DIAGNOSIS — R071 Chest pain on breathing: Secondary | ICD-10-CM | POA: Insufficient documentation

## 2011-11-09 DIAGNOSIS — E785 Hyperlipidemia, unspecified: Secondary | ICD-10-CM | POA: Insufficient documentation

## 2011-11-09 LAB — POCT I-STAT TROPONIN I: Troponin i, poc: 0.01 ng/mL (ref 0.00–0.08)

## 2011-11-09 LAB — PRO B NATRIURETIC PEPTIDE: Pro B Natriuretic peptide (BNP): 5 pg/mL (ref 0–125)

## 2011-11-09 MED ORDER — ALBUTEROL SULFATE (5 MG/ML) 0.5% IN NEBU
5.0000 mg | INHALATION_SOLUTION | Freq: Once | RESPIRATORY_TRACT | Status: AC
  Administered 2011-11-09: 5 mg via RESPIRATORY_TRACT
  Filled 2011-11-09: qty 1

## 2011-11-09 MED ORDER — ALBUTEROL SULFATE (5 MG/ML) 0.5% IN NEBU: 2.5000 mg | INHALATION_SOLUTION | RESPIRATORY_TRACT | Status: DC

## 2011-11-09 MED ORDER — HYDROCODONE-HOMATROPINE 5-1.5 MG/5ML PO SYRP: 5.0000 mL | ORAL_SOLUTION | Freq: Four times a day (QID) | ORAL | Status: AC | PRN

## 2011-11-09 MED ORDER — SALINE NASAL SPRAY 0.65 % NA SOLN: 1.0000 | NASAL | Status: DC | PRN

## 2011-11-09 MED ORDER — IPRATROPIUM BROMIDE 0.02 % IN SOLN
0.5000 mg | Freq: Once | RESPIRATORY_TRACT | Status: AC
  Administered 2011-11-09: 0.5 mg via RESPIRATORY_TRACT
  Filled 2011-11-09: qty 2.5

## 2011-11-09 MED ORDER — ACETAMINOPHEN 325 MG PO TABS
650.0000 mg | ORAL_TABLET | Freq: Once | ORAL | Status: AC
  Administered 2011-11-09: 650 mg via ORAL
  Filled 2011-11-09: qty 2

## 2011-11-09 NOTE — ED Notes (Signed)
Pt from [facility] c/o congestion and flu like sx with intermittant sharp right sided CP x several days; pt sent here for eval; pt sts some SOB

## 2011-11-09 NOTE — ED Notes (Signed)
Respiratory called for treatment, lab at bedside for blood draw.

## 2011-11-09 NOTE — ED Provider Notes (Signed)
History     CSN: [id number] Arrival date & time: 11/09/2011  4:21 PM   First MD Initiated Contact with Patient 11/09/11 1935      Chief Complaint  Patient presents with  . Chest Pain    (Consider location/radiation/quality/duration/timing/severity/associated sxs/prior treatment) HPI Comments: Patient presents to the ED from urgent care or.  His chief complaint include non-productive cough, shortness of breath, right-sided pleuritic chest pain, night sweats, chills, congestion.  Patient is a 50 y.o. male presenting with cough. The history is provided by the patient.  Cough This is a new problem. The current episode started 2 days ago. The problem has not changed since onset.The cough is non-productive. There has been no fever. Associated symptoms include chest pain (pleuritic; not like typical CP when had MI), chills, sweats, sore throat, shortness of breath and wheezing. Pertinent negatives include no weight loss, no ear congestion, no ear pain, no headaches, no rhinorrhea, no myalgias and no eye redness. He has tried nothing for the symptoms. He is not a smoker. His past medical history does not include COPD or asthma. Past medical history comments: Sleep apnea, MI.    Past Medical History  Diagnosis Date  . Coronary atherosclerosis of native coronary artery     Remote stent LAD, subsequent stents RCA with restenosis, DES to RCA and circ 8/08 in setting of restenosis, DES LAD and DES RCA 3/11  . Hyperlipidemia   . Essential hypertension, benign   . Hepatic steatosis   . Obstructive sleep apnea   . MI (myocardial infarction)     Anterior    Past Surgical History  Procedure Date  . Coronary angioplasty with stent placement     Family History  Problem Relation Age of Onset  . Coronary artery disease      History  Substance Use Topics  . Smoking status: Former Smoker    Types: Cigarettes  . Smokeless tobacco: Never Used   Comment: Tobacco - no  . Alcohol Use: No       Review of Systems  Constitutional: Positive for chills. Negative for fever, weight loss, diaphoresis and fatigue.  HENT: Positive for sore throat. Negative for ear pain, rhinorrhea, trouble swallowing, neck pain and neck stiffness.   Eyes: Negative for photophobia and redness.  Respiratory: Positive for cough, shortness of breath and wheezing. Negative for chest tightness and stridor.   Cardiovascular: Positive for chest pain (pleuritic; not like typical CP when had MI). Negative for palpitations and leg swelling.  Gastrointestinal: Negative for nausea, vomiting, abdominal pain, diarrhea and constipation.  Musculoskeletal: Negative for myalgias and gait problem.  Skin: Negative for pallor and rash.  Neurological: Negative for syncope, weakness, light-headedness, numbness and headaches.  Psychiatric/Behavioral: Negative for agitation.  All other systems reviewed and are negative.    Allergies  Reopro  Home Medications   Current Outpatient Rx  Name Route Sig Dispense Refill  . ASPIRIN 325 MG PO TABS Oral Take 325 mg by mouth daily.      [name] ESOMEPRAZOLE MAGNESIUM 40 MG PO CPDR Oral Take 1 capsule (40 mg total) by mouth daily. 90 capsule 4  . METOPROLOL SUCCINATE ER 50 MG PO TB24  Take 1 tablet in am and 2 tablets in pm 90 tablet 6  . ONE-DAILY MULTI VITAMINS PO TABS Oral Take 1 tablet by mouth daily.      [name] NITROGLYCERIN 0.4 MG SL SUBL Sublingual Place 0.4 mg under the tongue every 5 (five) minutes as needed. For chest pain    .  FISH OIL 1000 MG PO CAPS Oral Take 2 capsules by mouth 2 (two) times daily.      [name] PRASUGREL HCL 10 MG PO TABS Oral Take 10 mg by mouth daily.      [name] ROSUVASTATIN CALCIUM 20 MG PO TABS Oral Take 20 mg by mouth at bedtime.      [name] VALSARTAN-HYDROCHLOROTHIAZIDE 320-25 MG PO TABS Oral Take 1 tablet by mouth daily. 30 tablet 6    BP 148/80  Pulse 96  Temp(Src) 99 F (37.2 C) (Oral)  Resp 18  SpO2 99%  Physical Exam  Nursing note and vitals  reviewed. Constitutional: He is oriented to person, place, and time. He appears well-nourished. No distress.  HENT:  Head: Normocephalic and atraumatic. No trismus in the jaw.  Right Ear: External ear normal. No drainage or tenderness. No mastoid tenderness.  Left Ear: External ear normal. No drainage or tenderness. No mastoid tenderness.  Nose: Nose normal. No rhinorrhea or sinus tenderness.  Mouth/Throat: Uvula is midline, oropharynx is clear and moist and mucous membranes are normal. No uvula swelling. No oropharyngeal exudate.  Eyes: Conjunctivae and EOM are normal. Right eye exhibits no discharge. Left eye exhibits no discharge. No scleral icterus.  Neck: Normal range of motion. Neck supple.  Cardiovascular: Normal rate, regular rhythm and normal heart sounds.   Pulmonary/Chest: Effort normal. No stridor. No respiratory distress. He has wheezes. He exhibits no tenderness.  Abdominal: Soft. There is no tenderness.  Musculoskeletal: Normal range of motion.       Palpable pedal pulses.  No pitting edema bilaterally.  Neurological: He is alert and oriented to person, place, and time.  Skin: Skin is warm and dry. No rash noted. He is not diaphoretic.  Psychiatric: He has a normal mood and affect. His behavior is normal.    ED Course  Procedures (including critical care time)   Labs Reviewed  PRO B NATRIURETIC PEPTIDE  POCT I-STAT TROPONIN I  I-STAT TROPONIN I   Dg Chest 2 View  11/09/2011  *RADIOLOGY REPORT*  Clinical Data: Right-sided anterior chest pain  CHEST - 2 VIEW  Comparison: Chest x-ray of 08/23/2007  Findings: No active infiltrate or effusion is seen.  There is mild cardiomegaly present.  There may be mild pulmonary vascular congestion present.  No bony abnormality is seen  IMPRESSION: Cardiomegaly.  Question mild pulmonary vascular congestion.  Original Report Authenticated By: Juline Patch, M.D.     No diagnosis found.  Pt re-evaluated with Dr. Denton Lank who agrees w  discharge plan. Pt is to follow up with his PCP if s/s persist. VSS, NAD, Low PE probability (denies hemoptysis, smoking, recent travel surgery or bed rest, not tachycardic & has no hx of CA)   Pt dc with instructions to use normal saline nasal spray, hycodan for cough and pain, and musinex OTC for decongestant.      MDM  Cough, generalized body aches, congestion         [place], [place] 11/09/11 2234

## 2011-11-09 NOTE — ED Notes (Signed)
Pt reports having mild shortness of breath after breathing treatment. Pt placed on oxygen 2L via nasal cannula.

## 2011-11-09 NOTE — ED Provider Notes (Signed)
History     CSN: [id number] Arrival date & time: 11/09/2011  1:29 PM   None     Chief Complaint  Patient presents with  . Cough  . Generalized Body Aches    (Consider location/radiation/quality/duration/timing/severity/associated sxs/prior treatment) HPI Comments: Onset of cough, HA, and body aches 1 1/2 weeks ago. No fever. Has been hot and cold and had sweats. Symptoms began to improve then cough worsened again yesterday. Cough is nonproductive. Has one spot Rt lower chest that is intermittently painful with breathing and with cough, but states has not been hurting since arrival to Urgent Care. He states it feels like when he had pleurisy yrs ago.  No chest pressure. He is having some dyspnea, "a little more than usual," but denies wheezing.   Patient is a 50 y.o. male presenting with cough. The history is provided by the patient.  Cough Associated symptoms include chills and shortness of breath. Pertinent negatives include no ear pain, no rhinorrhea, no sore throat and no wheezing.    Past Medical History  Diagnosis Date  . Coronary atherosclerosis of native coronary artery     Remote stent LAD, subsequent stents RCA with restenosis, DES to RCA and circ 8/08 in setting of restenosis, DES LAD and DES RCA 3/11  . Hyperlipidemia   . Essential hypertension, benign   . Hepatic steatosis   . Obstructive sleep apnea   . MI (myocardial infarction)     Anterior    Past Surgical History  Procedure Date  . Coronary angioplasty with stent placement     Family History  Problem Relation Age of Onset  . Coronary artery disease      History  Substance Use Topics  . Smoking status: Former Smoker    Types: Cigarettes  . Smokeless tobacco: Never Used   Comment: Tobacco - no  . Alcohol Use: No      Review of Systems  Constitutional: Positive for chills. Negative for fever.  HENT: Negative for ear pain, congestion, sore throat, rhinorrhea and sinus pressure.   Respiratory:  Positive for cough and shortness of breath. Negative for wheezing.   Cardiovascular: Negative for palpitations and leg swelling.  Gastrointestinal: Negative for nausea and vomiting.    Allergies  Review of patient's allergies indicates no known allergies.  Home Medications   Current Outpatient Rx  Name Route Sig Dispense Refill  . ASPIRIN 325 MG PO TABS Oral Take 325 mg by mouth daily.      [name] ESOMEPRAZOLE MAGNESIUM 40 MG PO CPDR Oral Take 1 capsule (40 mg total) by mouth daily. 90 capsule 4  . METOPROLOL SUCCINATE ER 50 MG PO TB24  Take 1 tablet in am and 2 tablets in pm 90 tablet 6  . ONE-DAILY MULTI VITAMINS PO TABS Oral Take 1 tablet by mouth daily.      [name] FISH OIL 1000 MG PO CAPS Oral Take 2 capsules by mouth 2 (two) times daily.      [name] PRASUGREL HCL 10 MG PO TABS Oral Take 10 mg by mouth daily.      [name] ROSUVASTATIN CALCIUM 20 MG PO TABS Oral Take 20 mg by mouth at bedtime.      [name] VALSARTAN-HYDROCHLOROTHIAZIDE 320-25 MG PO TABS Oral Take 1 tablet by mouth daily. 30 tablet 6  . NITROGLYCERIN 0.4 MG SL SUBL Sublingual Place 0.4 mg under the tongue every 5 (five) minutes as needed.        BP 154/84  Pulse 99  Temp(Src) 98.7  F (37.1 C) (Oral)  Resp 18  SpO2 97%  Physical Exam  Nursing note and vitals reviewed. Constitutional: He appears well-developed and well-nourished.       Conversational dyspnea noted  HENT:  Head: Normocephalic and atraumatic.  Right Ear: Tympanic membrane, external ear and ear canal normal.  Left Ear: Tympanic membrane, external ear and ear canal normal.  Nose: Nose normal.  Mouth/Throat: Uvula is midline, oropharynx is clear and moist and mucous membranes are normal. No oropharyngeal exudate, posterior oropharyngeal edema or posterior oropharyngeal erythema.  Neck: Neck supple.  Cardiovascular: Normal rate, regular rhythm and normal heart sounds.   Pulmonary/Chest: Effort normal. No respiratory distress. He has no decreased breath sounds. He has no  wheezes. He has no rhonchi. He has no rales.       Friction rub Rt anterior inferior chest.  Lymphadenopathy:    He has no cervical adenopathy.  Neurological: He is alert.  Skin: Skin is warm and dry.  Psychiatric: He has a normal mood and affect.    ED Course  Procedures (including critical care time)  Labs Reviewed - No data to display Dg Chest 2 View  11/09/2011  *RADIOLOGY REPORT*  Clinical Data: Right-sided anterior chest pain  CHEST - 2 VIEW  Comparison: Chest x-ray of 08/23/2007  Findings: No active infiltrate or effusion is seen.  There is mild cardiomegaly present.  There may be mild pulmonary vascular congestion present.  No bony abnormality is seen  IMPRESSION: Cardiomegaly.  Question mild pulmonary vascular congestion.  Original Report Authenticated By: Juline Patch, M.D.     1. Pulmonary vascular congestion       MDM  EKG NSR rate 88. Inverted T wave aVR compared to previous, but V4,5,6 previously inverted and now upright.  CXR pulmonary vascular congestion. Pt transferred to ED for further evaluation.         Melody [place], [place] 11/09/11 [phone]

## 2011-11-09 NOTE — ED Notes (Signed)
Pt continues to deny any chest pain at this time. Pt does report having shortness of breath. Pt on breathing treatment at this time, plan of care is updated with verbal understanding. Will continue to monitor pt, pt INAD, skin w/d, resp e/u and A/Ox3.

## 2011-11-09 NOTE — ED Provider Notes (Signed)
Medical screening examination/treatment/procedure(s) were performed by non-physician practitioner and as supervising physician I was immediately available for consultation/collaboration.  Raynald Blend, MD 11/09/11 [phone]

## 2011-11-09 NOTE — ED Notes (Signed)
C/o non-productive cough, headache, body aches and occasional sharp pain to rt chest with deep breathing.  Sx started last Monday, he then began to feel better until yesterday when sx started getting worse again.

## 2011-11-09 NOTE — ED Notes (Signed)
Pt denies any questions upon discharge. 

## 2011-11-16 NOTE — ED Provider Notes (Signed)
Medical screening examination/treatment/procedure(s) were conducted as a shared visit with non-physician practitioner(s) and myself.  I personally evaluated the patient during the encounter   Suzi Roots, MD 11/16/11 [phone]

## 2011-12-15 ENCOUNTER — Other Ambulatory Visit: Payer: Self-pay | Admitting: Cardiology

## 2012-01-23 ENCOUNTER — Other Ambulatory Visit: Payer: Self-pay | Admitting: Cardiology

## 2012-01-25 ENCOUNTER — Other Ambulatory Visit: Payer: Self-pay | Admitting: Cardiology

## 2012-01-26 ENCOUNTER — Other Ambulatory Visit: Payer: Self-pay | Admitting: *Deleted

## 2012-01-26 MED ORDER — METOPROLOL SUCCINATE ER 50 MG PO TB24: 50.0000 mg | ORAL_TABLET | ORAL | Status: DC

## 2012-02-03 ENCOUNTER — Ambulatory Visit (INDEPENDENT_AMBULATORY_CARE_PROVIDER_SITE_OTHER): Payer: BC Managed Care – PPO | Admitting: Cardiology

## 2012-02-03 ENCOUNTER — Encounter: Payer: Self-pay | Admitting: Cardiology

## 2012-02-03 VITALS — BP 151/96 | HR 94 | Resp 18 | Ht 72.0 in | Wt 258.0 lb

## 2012-02-03 DIAGNOSIS — E785 Hyperlipidemia, unspecified: Secondary | ICD-10-CM

## 2012-02-03 DIAGNOSIS — I2510 Atherosclerotic heart disease of native coronary artery without angina pectoris: Secondary | ICD-10-CM

## 2012-02-03 DIAGNOSIS — I10 Essential (primary) hypertension: Secondary | ICD-10-CM

## 2012-02-03 MED ORDER — METOPROLOL SUCCINATE ER 50 MG PO TB24: 50.0000 mg | ORAL_TABLET | ORAL | Status: DC

## 2012-02-03 NOTE — Progress Notes (Signed)
Clinical Summary [name] is a 51 y.o.male presenting for followup. He was seen in June 2012. He describes occasional exertional angina, particularly if he is rushing in cold weather. He has not been exercising at the gym since November. Blood pressure is up today, he states that he ran out of Toprol-XL approximately one week ago. He has not required any nitroglycerin.  Labwork from 12/12 showed cholesterol 182, triglycerides 393, HDL 30, and LDL 73. We reviewed this. He states that he continues with both Crestor and omega-3 supplements. We discussed his diet and plan to resume exercise.  We reviewed his cardiac history, last intervention being 2 years ago. Plan to continue observation and medical therapy unless symptoms progress. We have discussed warning signs.   Allergies  Allergen Reactions  . Reopro (Abciximab) Other (See Comments)    Potassium level dropped    Current Outpatient Prescriptions  Medication Sig Dispense Refill  . aspirin 325 MG tablet Take 325 mg by mouth daily.        [name] esomeprazole (NEXIUM) 40 MG capsule Take 1 capsule (40 mg total) by mouth daily.  90 capsule  4  . metoprolol succinate (TOPROL-XL) 50 MG 24 hr tablet Take 1 tablet (50 mg total) by mouth as directed.  90 tablet  1  . Multiple Vitamin (MULTIVITAMIN) tablet Take 1 tablet by mouth daily.        . nitroGLYCERIN (NITROSTAT) 0.4 MG SL tablet Place 0.4 mg under the tongue every 5 (five) minutes as needed. For chest pain      . Omega-3 Fatty Acids (FISH OIL) 1000 MG CAPS Take 2 capsules by mouth 2 (two) times daily.        . prasugrel (EFFIENT) 10 MG TABS Take 10 mg by mouth daily.        . rosuvastatin (CRESTOR) 20 MG tablet Take 20 mg by mouth at bedtime.        . valsartan-hydrochlorothiazide (DIOVAN-HCT) 320-25 MG per tablet TAKE 1 TABLET BY MOUTH EVERY DAY  30 tablet  0  . DISCONTD: metoprolol succinate (TOPROL-XL) 50 MG 24 hr tablet Take 1 tablet (50 mg total) by mouth as directed.  90 tablet  1     Past Medical History  Diagnosis Date  . Coronary atherosclerosis of native coronary artery     Remote stent LAD, subsequent stents RCA with restenosis, DES to RCA and circ 8/08 in setting of restenosis, DES LAD and DES RCA 3/11  . Hyperlipidemia   . Essential hypertension, benign   . Hepatic steatosis   . Obstructive sleep apnea   . MI (myocardial infarction)     Anterior    Social History [name] reports that he has quit smoking. His smoking use included Cigarettes. He has never used smokeless tobacco. [name] reports that he does not drink alcohol.  Review of Systems No palpitations. No orthopnea or PND. No reported bleeding problems. Otherwise negative.  Physical Examination Filed Vitals:   02/03/12 1518  BP: 151/96  Pulse: 94  Resp: 18   Overweight male, NAD.  HEENT: Conjunctiva and lids normal, oropharynx clear.  Neck: Supple, no elevated jugular venous pressure.  Lungs: Clear to auscultation, nonlabored.  Cardiac: Regular rate and rhythm, no S3 gallop, no loud systolic murmur. PMI indistinct.  Skin: Warm and dry.  Extremities: No pitting edema, distal pulses one plus.  Musculoskeletal: No gross deformities.  Neuropsychiatric: Alert and oriented x3, affect appropriate.    Problem List and Plan

## 2012-02-03 NOTE — Assessment & Plan Note (Signed)
Recent LDL at goal, although triglycerides have increased again. Reinforced diet and exercise. He reports compliance with his medications.

## 2012-02-03 NOTE — Assessment & Plan Note (Signed)
Clinically stable at this point. Plan to continue medical therapy and observation. Warning signs discussed. Followup scheduled for 6 months.

## 2012-02-03 NOTE — Patient Instructions (Signed)
**Note De-Identified Tilla Wilborn Obfuscation** Your physician recommends that you continue on your current medications as directed. Please refer to the Current Medication list given to you today.  Your physician recommends that you return for lab work in: 6 months just before next office visit (Lipids/LFT's)  Your physician recommends that you schedule a follow-up appointment in: 6 months

## 2012-02-03 NOTE — Assessment & Plan Note (Signed)
Blood pressure is up today. Toprol-XL is refilled. We also discussed diet and exercise.

## 2012-02-21 ENCOUNTER — Other Ambulatory Visit: Payer: Self-pay | Admitting: Cardiology

## 2012-04-02 ENCOUNTER — Other Ambulatory Visit: Payer: Self-pay | Admitting: Cardiology

## 2012-04-20 ENCOUNTER — Other Ambulatory Visit: Payer: Self-pay | Admitting: *Deleted

## 2012-04-20 MED ORDER — NITROGLYCERIN 0.4 MG SL SUBL: 0.4000 mg | SUBLINGUAL_TABLET | SUBLINGUAL | Status: DC | PRN

## 2012-06-05 ENCOUNTER — Other Ambulatory Visit: Payer: Self-pay | Admitting: *Deleted

## 2012-06-05 MED ORDER — VALSARTAN-HYDROCHLOROTHIAZIDE 320-25 MG PO TABS: 1.0000 | ORAL_TABLET | Freq: Every day | ORAL | Status: DC

## 2012-06-05 MED ORDER — METOPROLOL SUCCINATE ER 50 MG PO TB24: 50.0000 mg | ORAL_TABLET | Freq: Every day | ORAL | Status: DC

## 2012-06-07 ENCOUNTER — Other Ambulatory Visit: Payer: Self-pay | Admitting: Cardiology

## 2012-06-07 MED ORDER — METOPROLOL SUCCINATE ER 50 MG PO TB24: 50.0000 mg | ORAL_TABLET | Freq: Every day | ORAL | Status: DC

## 2012-06-11 ENCOUNTER — Other Ambulatory Visit: Payer: Self-pay | Admitting: Cardiology

## 2012-06-11 MED ORDER — VALSARTAN-HYDROCHLOROTHIAZIDE 320-25 MG PO TABS: 1.0000 | ORAL_TABLET | Freq: Every day | ORAL | Status: DC

## 2012-06-18 ENCOUNTER — Other Ambulatory Visit: Payer: Self-pay | Admitting: Cardiology

## 2012-06-18 ENCOUNTER — Telehealth: Payer: Self-pay | Admitting: Cardiology

## 2012-06-18 MED ORDER — METOPROLOL SUCCINATE ER 50 MG PO TB24: 50.0000 mg | ORAL_TABLET | Freq: Every day | ORAL | Status: DC

## 2012-06-18 MED ORDER — METOPROLOL SUCCINATE ER 50 MG PO TB24: 50.0000 mg | ORAL_TABLET | Freq: Two times a day (BID) | ORAL | Status: DC

## 2012-06-18 NOTE — Telephone Encounter (Signed)
Called pharmacy to inform them that two refill request was sent and to disregard the first request and to use the second request.

## 2012-06-19 ENCOUNTER — Telehealth: Payer: Self-pay | Admitting: Cardiology

## 2012-06-19 MED ORDER — METOPROLOL SUCCINATE ER 50 MG PO TB24: 50.0000 mg | ORAL_TABLET | Freq: Two times a day (BID) | ORAL | Status: DC

## 2012-06-19 NOTE — Telephone Encounter (Signed)
RX# [id number] METOPROLOL NEEDS CALLED IN TO [facility] NOT [facility]. PLEASE LET THEM KNOW IT WAS CALLED IN WRONG THE FIRST TIME SO PT DOESN'T GET CHARGED TWICE.

## 2012-07-26 ENCOUNTER — Other Ambulatory Visit: Payer: Self-pay | Admitting: Cardiology

## 2012-07-26 LAB — HEPATIC FUNCTION PANEL
ALT: 67 U/L — ABNORMAL HIGH (ref 0–53)
AST: 36 U/L (ref 0–37)
Albumin: 4.3 g/dL (ref 3.5–5.2)
Alkaline Phosphatase: 79 U/L (ref 39–117)
Bilirubin, Direct: 0.1 mg/dL (ref 0.0–0.3)
Indirect Bilirubin: 0.5 mg/dL (ref 0.0–0.9)
Total Bilirubin: 0.6 mg/dL (ref 0.3–1.2)
Total Protein: 7 g/dL (ref 6.0–8.3)

## 2012-07-26 LAB — LIPID PANEL
Cholesterol: 165 mg/dL (ref 0–200)
HDL: 28 mg/dL — ABNORMAL LOW (ref >39)
Total CHOL/HDL Ratio: 5.9 Ratio
Triglycerides: 718 mg/dL — ABNORMAL HIGH (ref <150)

## 2012-08-02 ENCOUNTER — Encounter: Payer: Self-pay | Admitting: Adult Health

## 2012-08-02 ENCOUNTER — Ambulatory Visit (INDEPENDENT_AMBULATORY_CARE_PROVIDER_SITE_OTHER): Payer: BC Managed Care – PPO | Admitting: Adult Health

## 2012-08-02 ENCOUNTER — Ambulatory Visit: Payer: BC Managed Care – PPO | Admitting: Cardiology

## 2012-08-02 VITALS — BP 134/84 | HR 86 | Wt 240.0 lb

## 2012-08-02 DIAGNOSIS — E785 Hyperlipidemia, unspecified: Secondary | ICD-10-CM

## 2012-08-02 DIAGNOSIS — I200 Unstable angina: Secondary | ICD-10-CM

## 2012-08-02 DIAGNOSIS — I2510 Atherosclerotic heart disease of native coronary artery without angina pectoris: Secondary | ICD-10-CM

## 2012-08-02 MED ORDER — FENOFIBRATE 48 MG PO TABS: 48.0000 mg | ORAL_TABLET | Freq: Every day | ORAL | Status: DC

## 2012-08-02 NOTE — Patient Instructions (Addendum)
Your physician has recommended you make the following change in your medication: Start Tricor (fenofibrate) 1 tablet daily  Your physician recommends that you return for lab work in: 2 weeks at the [facility] lab across from [facility]  Your physician recommends that you schedule a follow-up appointment in: 1 month with Dr. Diona Browner  Your physician has recommended that you follow a low carbohydrate diet. Please see the information sheet below.  Basic Carbohydrate Counting Basic carbohydrate counting is a way to plan meals. It is done by counting the amount of carbohydrate in foods. Eating carbohydrates increases blood glucose (sugar) levels. People with diabetes use carbohydrate counting to help keep their blood glucose at a normal level.  Foods that have carbohydrates are starches (grains, beans, starchy vegetables) and sweets.  COUNTING CARBOHYDRATES IN FOODS The first step in counting carbohydrates is to learn how many carbohydrate servings you should have in every meal. A dietitian can plan this for you. After learning the amount of carbohydrates to include in your meal plan, you can start to choose the carbohydrate-containing foods you want to eat.  There are 2 ways to identify the amount of carbohydrates in the foods you eat.  Read the Nutrition Facts panel on food labels. All you need are 2 pieces of information from the Nutrition Facts panel to count carbohydrates this way:   Serving size.   Total carbohydrate (in grams).  Decide how many servings you will be eating. If it is 1 serving, you will be eating the amount of carbohydrate listed on the panel. If you will be eating 2 servings, you will be eating double the amount of carbohydrate listed on the panel.   Learn serving sizes. A serving size of most carbohydrate-containing foods is about 15 g. Listed below are serving sizes of common carbohydrate-containing foods:   1 slice bread.    cup unsweetened, dry cereal.    cup hot cereal.     ? cup rice.    cup mashed potatoes.   ? cup pasta.   1 cup fresh fruit.    cup canned fruit.   1 cup milk (whole, 2%, or skim).    cup starchy vegetables (peas, corn, or potatoes).  Counting carbohydrates this way is similar to looking on the Nutrition Facts panel. Decide how many servings you will eat first. Multiply the number of servings you eat by 15 g. For example, if you have 2 cups of strawberries, you had 2 servings. That means you had 30 g of carbohydrate (2 servings x 15 g = 30 g). CALCULATING CARBOHYDRATES IN A MEAL Sample dinner  3 oz chicken breast.   ? cup brown rice.    cup corn.   1 cup fat-free milk.   1 cup strawberries with sugar-free whipped topping.  Carbohydrate calculation First, identify the foods that contain carbohydrate:  Rice.   Corn.   Milk.   Strawberries.  Calculate the number of servings eaten:  2 servings rice.   1 serving corn.   1 serving milk.   1 serving strawberries.  Multiply the number of servings by 15 g:  2 servings rice x 15 g = 30 g.   1 serving corn x 15 g = 15 g.   1 serving milk x 15 g = 15 g.   1 serving strawberries x 15 g = 15 g.  Add the amounts to find the total carbohydrates eaten: 30 g + 15 g + 15 g + 15 g = 75 g  carbohydrate eaten at dinner. Document Released: 11/07/2005 Document Revised: 10/27/2011 Document Reviewed: 09/23/2011 [facility] Patient Information 2012 [place], [place].

## 2012-08-02 NOTE — Assessment & Plan Note (Signed)
He offers no complaints of chest pain shortness of breath dizziness or lack of energy. He is complaining of fatigue from lack of sleep. He does not have stable sleep hygiene with changes in his work  shifts. We will continue risk reduction. As stated he is on a low-cholesterol diet and I have added a low carb diet to his regimen

## 2012-08-02 NOTE — Assessment & Plan Note (Signed)
I reviewed his labs with him, have discussed a low carbohydrate diet, and provided him with low-dose TriCor at 45 mg daily. He will have followup chemistries in 2 weeks to evaluate kidney and liver function. I have given him instructions on low-carb diet.  I have also stated that he can take over-the-counter Benadryl to help with insomnia.

## 2012-08-02 NOTE — Progress Notes (Signed)
HPI: [name] is a 51 year old patient of Dr. Diona Browner we are seeing on followup with known history of mixed hyperlipidemia CAD, with stent to the LAD, subsequent stents to the right coronary artery 3 stenosis, drug-eluting stent to the RCA and circumflex, in the setting restenosis; drug-eluting stent to the LAD and drug-eluting stent to the RCA in March of 2011. The patient comes today on 6 month followup with labs revealing elevated triglycerides of greater than 700.   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 Denies use of alcohol.   Labs completed on 07/26/2012 show a cholesterol of 165 HDL of 28 triglycerides of 718 LDL was not able to be calculated. States this was completed fasting.   He denies any chest pain shortness of breath or nausea. He feels tired all the time working nights. He states he works from [employer] to [employer] ,  Saturday Sunday and Monday, and is off the rest of the week. He states that this change in his work hours have taken a toll on his sleep regimen.  Allergies  Allergen Reactions  . Reopro (Abciximab) Other (See Comments)    Potassium level dropped    Current Outpatient Prescriptions  Medication Sig Dispense Refill  . aspirin 325 MG tablet Take 325 mg by mouth daily.        [name] esomeprazole (NEXIUM) 40 MG capsule Take 1 capsule (40 mg total) by mouth daily.  90 capsule  4  . metoprolol succinate (TOPROL-XL) 50 MG 24 hr tablet Take 1 tablet (50 mg total) by mouth 2 (two) times daily. Take with or immediately following a meal.  270 tablet  3  . Multiple Vitamin (MULTIVITAMIN) tablet Take 1 tablet by mouth daily.        . nitroGLYCERIN (NITROSTAT) 0.4 MG SL tablet Place 1 tablet (0.4 mg total) under the tongue every 5 (five) minutes as needed. For chest pain  25 tablet  6  . Omega-3 Fatty Acids (FISH OIL) 1000 MG CAPS  Take 2 capsules by mouth 2 (two) times daily.        . prasugrel (EFFIENT) 10 MG TABS Take 10 mg by mouth daily.        . rosuvastatin (CRESTOR) 20 MG tablet Take 20 mg by mouth at bedtime.        . valsartan-hydrochlorothiazide (DIOVAN-HCT) 320-25 MG per tablet Take 1 tablet by mouth daily.  90 tablet  3  . fenofibrate (TRICOR) 48 MG tablet Take 1 tablet (48 mg total) by mouth daily.  90 tablet  3  . DISCONTD: sodium chloride (OCEAN NASAL SPRAY) 0.65 % nasal spray Place 1 spray into the nose as needed for congestion.  30 mL  12    Past Medical History  Diagnosis Date  . Coronary atherosclerosis of native coronary artery     Remote stent LAD, subsequent stents RCA with restenosis, DES to RCA and circ 8/08 in setting of restenosis, DES LAD and DES RCA 3/11  . Hyperlipidemia   . Essential hypertension, benign   . Hepatic steatosis   . Obstructive sleep apnea   . MI (myocardial infarction)     Anterior    Past Surgical History  Procedure Date  . Coronary angioplasty with stent placement     ZOX:[id number] of systems complete and found to be negative unless listed  above  PHYSICAL EXAM BP 134/84  Pulse 86  Wt 240 lb (108.863 kg)  General: Well developed, well nourished, in no acute distress Head: Eyes PERRLA, No xanthomas.   Normal cephalic and atramatic  Lungs: Clear bilaterally to auscultation and percussion. Heart: HRRR S1 S2, without MRG.  Pulses are 2+ & equal.            No carotid bruit. No JVD.  No abdominal bruits. No femoral bruits. Abdomen: Bowel sounds are positive, abdomen soft and non-tender without masses or  Hernia's noted. Obese. Msk:  Back normal, normal gait. Normal strength and tone for age. Extremities: No clubbing, cyanosis or edema.  DP +1 Neuro: Alert and oriented X 3. Psych:  Good affect, responds appropriately  EKG:NSR with evidence of anterior infarct. Rate of 80  ASSESSMENT AND PLAN

## 2012-08-03 ENCOUNTER — Encounter: Payer: Self-pay | Admitting: *Deleted

## 2012-08-06 ENCOUNTER — Ambulatory Visit: Payer: BC Managed Care – PPO | Admitting: Adult Health

## 2012-08-16 ENCOUNTER — Other Ambulatory Visit: Payer: Self-pay | Admitting: Adult Health

## 2012-08-16 LAB — BASIC METABOLIC PANEL
BUN: 19 mg/dL (ref 6–23)
CO2: 26 mEq/L (ref 19–32)
Calcium: 9.9 mg/dL (ref 8.4–10.5)
Chloride: 102 mEq/L (ref 96–112)
Creat: 0.92 mg/dL (ref 0.50–1.35)
Glucose, Bld: 150 mg/dL — ABNORMAL HIGH (ref 70–99)
Potassium: 4.2 mEq/L (ref 3.5–5.3)
Sodium: 139 mEq/L (ref 135–145)

## 2012-08-17 ENCOUNTER — Other Ambulatory Visit: Payer: Self-pay | Admitting: *Deleted

## 2012-08-17 DIAGNOSIS — I200 Unstable angina: Secondary | ICD-10-CM

## 2012-08-17 DIAGNOSIS — E785 Hyperlipidemia, unspecified: Secondary | ICD-10-CM

## 2012-09-05 ENCOUNTER — Ambulatory Visit (INDEPENDENT_AMBULATORY_CARE_PROVIDER_SITE_OTHER): Payer: BC Managed Care – PPO | Admitting: Cardiology

## 2012-09-05 ENCOUNTER — Encounter: Payer: Self-pay | Admitting: *Deleted

## 2012-09-05 ENCOUNTER — Encounter: Payer: Self-pay | Admitting: Cardiology

## 2012-09-05 VITALS — BP 148/78 | HR 84 | Ht 73.0 in | Wt 239.0 lb

## 2012-09-05 DIAGNOSIS — E782 Mixed hyperlipidemia: Secondary | ICD-10-CM

## 2012-09-05 DIAGNOSIS — I2510 Atherosclerotic heart disease of native coronary artery without angina pectoris: Secondary | ICD-10-CM

## 2012-09-05 DIAGNOSIS — E785 Hyperlipidemia, unspecified: Secondary | ICD-10-CM

## 2012-09-05 NOTE — Patient Instructions (Addendum)
Your physician recommends that you schedule a follow-up appointment in: 6 months  Your physician recommends that you return for lab work in: In 2 months

## 2012-09-05 NOTE — Assessment & Plan Note (Signed)
Symptomatically stable on medical therapy. Routine follow up in 6 months.

## 2012-09-05 NOTE — Assessment & Plan Note (Signed)
Continue current regimen for now. I reinforced low carbohydrate diet and regular exercise, as he has been quite successful in the past in terms of lipid control. We might eventually be able to get him off TriCor. Followup FLP and LFT in 2 months.

## 2012-09-05 NOTE — Progress Notes (Signed)
Clinical Summary [name] is a 51 y.o.male presenting for followup. He was seen recently in the office by Ms. Lawrence NP in September.  He was placed on TriCor at that time due to significantly elevated triglycerides in the 700s. It had been much better controlled in the past, although he was dieting and exercising regularly at that time.  BMET on 9/26 showed potassium 4.2, BUN 19, creatinine 0.9.  He reports no angina. We discussed his diet, he has not yet started exercising. 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   Allergies  Allergen Reactions  . Reopro (Abciximab) Other (See Comments)    Potassium level dropped    Current Outpatient Prescriptions  Medication Sig Dispense Refill  . aspirin 325 MG tablet Take 325 mg by mouth daily.        [name] esomeprazole (NEXIUM) 40 MG capsule Take 1 capsule (40 mg total) by mouth daily.  90 capsule  4  . fenofibrate (TRICOR) 48 MG tablet Take 1 tablet (48 mg total) by mouth daily.  90 tablet  3  . metoprolol succinate (TOPROL-XL) 50 MG 24 hr tablet Take 1 tablet (50 mg total) by mouth 2 (two) times daily. Take with or immediately following a meal.  270 tablet  3  . Multiple Vitamin (MULTIVITAMIN) tablet Take 1 tablet by mouth daily.        . nitroGLYCERIN (NITROSTAT) 0.4 MG SL tablet Place 1 tablet (0.4 mg total) under the tongue every 5 (five) minutes as needed. For chest pain  25 tablet  6  . Omega-3 Fatty Acids (FISH OIL) 1000 MG CAPS Take 2 capsules by mouth 2 (two) times daily.        . prasugrel (EFFIENT) 10 MG TABS Take 10 mg by mouth daily.        . rosuvastatin (CRESTOR) 20 MG tablet Take 20 mg by mouth at bedtime.        . valsartan-hydrochlorothiazide (DIOVAN-HCT) 320-25 MG per tablet Take 1 tablet by mouth daily.  90 tablet  3  . DISCONTD: sodium chloride (OCEAN NASAL SPRAY) 0.65 % nasal spray Place 1 spray into the nose as needed for congestion.  30 mL  12    Past  Medical History  Diagnosis Date  . Coronary atherosclerosis of native coronary artery     Remote stent LAD, subsequent stents RCA with restenosis, DES to RCA and circ 8/08 in setting of restenosis, DES LAD and DES RCA 3/11  . Hyperlipidemia   . Essential hypertension, benign   . Hepatic steatosis   . Obstructive sleep apnea   . MI (myocardial infarction)     Anterior    Social History [name] reports that he has quit smoking. His smoking use included Cigarettes. He has never used smokeless tobacco. [name] reports that he does not drink alcohol.  Review of Systems Had no palpitations, dizziness, bleeding problems. Otherwise negative.  Physical Examination Filed Vitals:   09/05/12 1054  BP: 148/78  Pulse: 84   Filed Weights   09/05/12 1054  Weight: 239 lb (108.41 kg)    HEENT: Conjunctiva and lids normal, oropharynx clear.  Neck: Supple, no elevated jugular venous pressure.  Lungs: Clear to auscultation, nonlabored.  Cardiac: Regular rate and rhythm, no S3 gallop, no loud systolic murmur. PMI indistinct.  Skin: Warm and dry.  Extremities: No pitting edema, distal pulses one plus.  Musculoskeletal: No gross deformities.  Neuropsychiatric: Alert  and oriented x3, affect appropriate.    Problem List and Plan   DYSLIPIDEMIA Continue current regimen for now. I reinforced low carbohydrate diet and regular exercise, as he has been quite successful in the past in terms of lipid control. We might eventually be able to get him off TriCor. Followup FLP and LFT in 2 months.  CORONARY ATHEROSCLEROSIS NATIVE CORONARY ARTERY Symptomatically stable on medical therapy. Routine follow up in 6 months.    Jonelle Sidle, M.D., F.A.C.C.

## 2012-11-02 ENCOUNTER — Encounter: Payer: Self-pay | Admitting: *Deleted

## 2012-11-02 ENCOUNTER — Other Ambulatory Visit: Payer: Self-pay | Admitting: *Deleted

## 2012-11-02 DIAGNOSIS — E782 Mixed hyperlipidemia: Secondary | ICD-10-CM

## 2012-11-07 ENCOUNTER — Other Ambulatory Visit: Payer: Self-pay | Admitting: Cardiology

## 2012-11-08 ENCOUNTER — Encounter: Payer: Self-pay | Admitting: *Deleted

## 2012-11-08 LAB — HEPATIC FUNCTION PANEL
ALT: 48 U/L (ref 0–53)
AST: 35 U/L (ref 0–37)
Albumin: 4.8 g/dL (ref 3.5–5.2)
Alkaline Phosphatase: 59 U/L (ref 39–117)
Bilirubin, Direct: 0.1 mg/dL (ref 0.0–0.3)
Indirect Bilirubin: 0.3 mg/dL (ref 0.0–0.9)
Total Bilirubin: 0.4 mg/dL (ref 0.3–1.2)
Total Protein: 7.8 g/dL (ref 6.0–8.3)

## 2012-11-08 LAB — LIPID PANEL
Cholesterol: 210 mg/dL — ABNORMAL HIGH (ref 0–200)
HDL: 41 mg/dL (ref >39)
LDL Cholesterol: 127 mg/dL — ABNORMAL HIGH (ref 0–99)
Total CHOL/HDL Ratio: 5.1 Ratio
Triglycerides: 212 mg/dL — ABNORMAL HIGH (ref <150)
VLDL: 42 mg/dL — ABNORMAL HIGH (ref 0–40)

## 2013-03-06 ENCOUNTER — Telehealth: Payer: Self-pay | Admitting: Cardiology

## 2013-03-06 MED ORDER — PRASUGREL HCL 10 MG PO TABS: 10.0000 mg | ORAL_TABLET | Freq: Every day | ORAL | Status: DC

## 2013-03-06 NOTE — Telephone Encounter (Signed)
Done

## 2013-03-06 NOTE — Telephone Encounter (Signed)
Needs RX for Effient sent to [facility] in [place]. / tgs

## 2013-03-22 ENCOUNTER — Encounter: Payer: Self-pay | Admitting: Adult Health

## 2013-03-22 ENCOUNTER — Ambulatory Visit (INDEPENDENT_AMBULATORY_CARE_PROVIDER_SITE_OTHER): Payer: BC Managed Care – PPO | Admitting: Adult Health

## 2013-03-22 VITALS — BP 114/70 | HR 69 | Ht 73.0 in | Wt 237.8 lb

## 2013-03-22 DIAGNOSIS — I209 Angina pectoris, unspecified: Secondary | ICD-10-CM

## 2013-03-22 DIAGNOSIS — I2510 Atherosclerotic heart disease of native coronary artery without angina pectoris: Secondary | ICD-10-CM

## 2013-03-22 DIAGNOSIS — I10 Essential (primary) hypertension: Secondary | ICD-10-CM

## 2013-03-22 DIAGNOSIS — E785 Hyperlipidemia, unspecified: Secondary | ICD-10-CM

## 2013-03-22 DIAGNOSIS — E781 Pure hyperglyceridemia: Secondary | ICD-10-CM

## 2013-03-22 NOTE — Progress Notes (Deleted)
Name: [name]    DOB: 12/27/1960  Age: 52 y.o.  MR#: [id number]       PCP:  Colette Ribas, MD      Insurance: Payor: BLUE CROSS BLUE SHIELD  Plan: BCBS PPO OUT OF STATE  Product Type: *No Product type*    CC:    Chief Complaint  Patient presents with  . Coronary Artery Disease  . Hyperlipidemia    VS Filed Vitals:   03/22/13 1123  BP: 114/70  Pulse: 69  Height: 6\' 1"  (1.854 m)  Weight: 237 lb 12.8 oz (107.865 kg)  SpO2: 97%    Weights Current Weight  03/22/13 237 lb 12.8 oz (107.865 kg)  09/05/12 239 lb (108.41 kg)  08/02/12 240 lb (108.863 kg)    Blood Pressure  BP Readings from Last 3 Encounters:  03/22/13 114/70  09/05/12 148/78  08/02/12 134/84     Admit date:  (Not on file) Last encounter with RMR:  Visit date not found   Allergy Reopro  Current Outpatient Prescriptions  Medication Sig Dispense Refill  . aspirin 325 MG tablet Take 325 mg by mouth daily.        [name] esomeprazole (NEXIUM) 40 MG capsule Take 1 capsule (40 mg total) by mouth daily.  90 capsule  4  . fenofibrate (TRICOR) 48 MG tablet Take 1 tablet (48 mg total) by mouth daily.  90 tablet  3  . metoprolol succinate (TOPROL-XL) 50 MG 24 hr tablet Take 1 tablet (50 mg total) by mouth 2 (two) times daily. Take with or immediately following a meal.  270 tablet  3  . Multiple Vitamin (MULTIVITAMIN) tablet Take 1 tablet by mouth daily.        . nitroGLYCERIN (NITROSTAT) 0.4 MG SL tablet Place 1 tablet (0.4 mg total) under the tongue every 5 (five) minutes as needed. For chest pain  25 tablet  6  . Omega-3 Fatty Acids (FISH OIL) 1000 MG CAPS Take 2 capsules by mouth 2 (two) times daily.        . prasugrel (EFFIENT) 10 MG TABS Take 1 tablet (10 mg total) by mouth daily.  30 tablet  6  . rosuvastatin (CRESTOR) 20 MG tablet Take 20 mg by mouth at bedtime.        . valsartan-hydrochlorothiazide (DIOVAN-HCT) 320-25 MG per tablet Take 1 tablet by mouth daily.  90 tablet  3  . [DISCONTINUED] sodium chloride  (OCEAN NASAL SPRAY) 0.65 % nasal spray Place 1 spray into the nose as needed for congestion.  30 mL  12   No current facility-administered medications for this visit.    Discontinued Meds:   There are no discontinued medications.  Patient Active Problem List   Diagnosis Date Noted  . ESSENTIAL HYPERTENSION, BENIGN 01/29/2010  . ANGINA, STABLE 09/01/2009  . CORONARY ATHEROSCLEROSIS NATIVE CORONARY ARTERY 09/01/2009  . DYSLIPIDEMIA 08/31/2009    LABS    Component Value Date/Time   NA 139 08/16/2012 0733   NA 135 02/24/2010 1043   NA 135 02/24/2010   K 4.2 08/16/2012 0733   K 3.8 02/24/2010 1043   K 3.8 02/24/2010   CL 102 08/16/2012 0733   CL 99 02/24/2010 1043   CL 99 02/24/2010   CO2 26 08/16/2012 0733   CO2 30 02/24/2010 1043   CO2 30 02/24/2010   GLUCOSE 150* 08/16/2012 0733   GLUCOSE 79 02/24/2010 1043   GLUCOSE 79 02/24/2010   BUN 19 08/16/2012 0733   BUN 17  02/24/2010 1043   BUN 17 02/24/2010   CREATININE 0.92 08/16/2012 0733   CREATININE 1.03 02/24/2010 1043   CREATININE 1.03 02/24/2010   CREATININE 1.01 02/03/2010 0600   CALCIUM 9.9 08/16/2012 0733   CALCIUM 9.4 02/24/2010 1043   CALCIUM 9.4 02/24/2010   GFRNONAA >60 02/03/2010 0600   GFRNONAA >60 02/02/2010 0603   GFRNONAA >60 08/23/2007 1610   GFRAA  Value: >60        The eGFR has been calculated using the MDRD equation. This calculation has not been validated in all clinical situations. eGFR's persistently <60 mL/min signify possible Chronic Kidney Disease. 02/03/2010 0600   GFRAA  Value: >60        The eGFR has been calculated using the MDRD equation. This calculation has not been validated in all clinical situations. eGFR's persistently <60 mL/min signify possible Chronic Kidney Disease. 02/02/2010 0603   GFRAA  Value: >60        The eGFR has been calculated using the MDRD equation. This calculation has not been validated in all clinical 08/23/2007 0652   CMP     Component Value Date/Time   NA 139 08/16/2012 0733   K 4.2 08/16/2012 0733   CL 102  08/16/2012 0733   CO2 26 08/16/2012 0733   GLUCOSE 150* 08/16/2012 0733   BUN 19 08/16/2012 0733   CREATININE 0.92 08/16/2012 0733   CREATININE 1.03 02/24/2010 1043   CALCIUM 9.9 08/16/2012 0733   PROT 7.8 11/07/2012 0930   ALBUMIN 4.8 11/07/2012 0930   AST 35 11/07/2012 0930   ALT 48 11/07/2012 0930   ALKPHOS 59 11/07/2012 0930   BILITOT 0.4 11/07/2012 0930   GFRNONAA >60 02/03/2010 0600   GFRAA  Value: >60        The eGFR has been calculated using the MDRD equation. This calculation has not been validated in all clinical situations. eGFR's persistently <60 mL/min signify possible Chronic Kidney Disease. 02/03/2010 0600       Component Value Date/Time   WBC 5.2 02/03/2010 0600   WBC 4.3 02/02/2010 0603   WBC 5.3 08/23/2007 0652   HGB 14.2 02/03/2010 0600   HGB 13.7 02/02/2010 0603   HGB 15.0 08/23/2007 0652   HCT 40.7 02/03/2010 0600   HCT 38.9* 02/02/2010 0603   HCT 42.7 08/23/2007 0652   MCV 92.4 02/03/2010 0600   MCV 91.5 02/02/2010 0603   MCV 89.5 08/23/2007 0652    Lipid Panel     Component Value Date/Time   CHOL 210* 11/07/2012 0930   TRIG 212* 11/07/2012 0930   HDL 41 11/07/2012 0930   CHOLHDL 5.1 11/07/2012 0930   VLDL 42* 11/07/2012 0930   LDLCALC 127* 11/07/2012 0930    ABG No results found for this basename: phart, pco2, pco2art, po2, po2art, hco3, tco2, acidbasedef, o2sat     No results found for this basename: TSH   BNP (last 3 results) No results found for this basename: PROBNP,  in the last 8760 hours Cardiac Panel (last 3 results) No results found for this basename: CKTOTAL, CKMB, TROPONINI, RELINDX,  in the last 72 hours  Iron/TIBC/Ferritin No results found for this basename: iron, tibc, ferritin     EKG Orders placed in visit on 03/22/13  . EKG 12-LEAD     Prior Assessment and Plan Problem List as of 03/22/2013     ICD-9-CM     Cardiology Problems   DYSLIPIDEMIA   Last Assessment & Plan   09/05/2012 Office Visit Written 09/05/2012 11:20  AM by Jonelle Sidle, MD     Continue current regimen for now. I reinforced low carbohydrate diet and regular exercise, as he has been quite successful in the past in terms of lipid control. We might eventually be able to get him off TriCor. Followup FLP and LFT in 2 months.    ESSENTIAL HYPERTENSION, BENIGN   Last Assessment & Plan   02/03/2012 Office Visit Written 02/03/2012  3:49 PM by Jonelle Sidle, MD     Blood pressure is up today. Toprol-XL is refilled. We also discussed diet and exercise.    ANGINA, STABLE   CORONARY ATHEROSCLEROSIS NATIVE CORONARY ARTERY   Last Assessment & Plan   09/05/2012 Office Visit Written 09/05/2012 11:20 AM by Jonelle Sidle, MD     Symptomatically stable on medical therapy. Routine follow up in 6 months.        Imaging: No results found.

## 2013-03-22 NOTE — Assessment & Plan Note (Signed)
There is no change on his EKG on review. We will continue risk factor assessment and treatment. He will continue on current medical regimen.

## 2013-03-22 NOTE — Patient Instructions (Addendum)
Fasting Lab Work tomorrow 03/23/13 at [facility] 8:00 am to 12:00 noon    Your physician recommends that you schedule a follow-up appointment in: 3 months with Dr.McDowell

## 2013-03-22 NOTE — Progress Notes (Signed)
HPI: [name] is a 52 year old patient of Dr. Simona [name] were following for ongoing assessment and treatment of his CAD, with history of hypertension and mixed hyperlipidemia. The patient was last seen by Dr. Diona [name] in October 2013 and was stable without cardiac complaints.   Followup lipids and LFTs were completed in December of 2013 demonstrated total cholesterol 210 triglycerides 212 LDL calculation was 127. He remained on TriCor.  Has had an occurrence of angina after moving a heavy barrel at work one month ago. Felt heart racnig and felt a little weakness. Did not talk NTG  Lasted about 5 mintues and went away when he sat down. Continues intermittent angina lasting less than a minute usually relived with rest. The patient states his usual anginal equivalent is significant shortness of breath and chest pressure. He did not experience the shortness of breath during the episode 1 month ago for recurrent transient episodes. He is medically compliant, has been dieting and has lost 12 pounds.    Most recent intervention completed in March of 2011 with DES to the LAD secondary to 99% stenosis, and DES to RCA due to 95% stenosis, per Dr. Excell [name].  Allergies  Allergen Reactions  . Reopro (Abciximab) Other (See Comments)    Potassium level dropped    Current Outpatient Prescriptions  Medication Sig Dispense Refill  . aspirin 325 MG tablet Take 325 mg by mouth daily.        [name] esomeprazole (NEXIUM) 40 MG capsule Take 1 capsule (40 mg total) by mouth daily.  90 capsule  4  . fenofibrate (TRICOR) 48 MG tablet Take 1 tablet (48 mg total) by mouth daily.  90 tablet  3  . metoprolol succinate (TOPROL-XL) 50 MG 24 hr tablet Take 1 tablet (50 mg total) by mouth 2 (two) times daily. Take with or immediately following a meal.  270 tablet  3  . Multiple Vitamin (MULTIVITAMIN) tablet Take 1 tablet by mouth daily.        . nitroGLYCERIN (NITROSTAT) 0.4 MG SL tablet Place 1 tablet (0.4 mg total) under the  tongue every 5 (five) minutes as needed. For chest pain  25 tablet  6  . Omega-3 Fatty Acids (FISH OIL) 1000 MG CAPS Take 2 capsules by mouth 2 (two) times daily.        . prasugrel (EFFIENT) 10 MG TABS Take 1 tablet (10 mg total) by mouth daily.  30 tablet  6  . rosuvastatin (CRESTOR) 20 MG tablet Take 20 mg by mouth at bedtime.        . valsartan-hydrochlorothiazide (DIOVAN-HCT) 320-25 MG per tablet Take 1 tablet by mouth daily.  90 tablet  3  . [DISCONTINUED] sodium chloride (OCEAN NASAL SPRAY) 0.65 % nasal spray Place 1 spray into the nose as needed for congestion.  30 mL  12   No current facility-administered medications for this visit.    Past Medical History  Diagnosis Date  . Coronary atherosclerosis of native coronary artery     Remote stent LAD, subsequent stents RCA with restenosis, DES to RCA and circ 8/08 in setting of restenosis, DES LAD and DES RCA 3/11  . Hyperlipidemia   . Essential hypertension, benign   . Hepatic steatosis   . Obstructive sleep apnea   . MI (myocardial infarction)     Anterior    Past Surgical History  Procedure Laterality Date  . Coronary angioplasty with stent placement      ZOX:WRUEAV of systems complete and found to  be negative unless listed above  PHYSICAL EXAM BP 114/70  Pulse 69  Ht 6\' 1"  (1.854 m)  Wt 237 lb 12.8 oz (107.865 kg)  BMI 31.38 kg/m2  SpO2 97%   General: Well developed, well nourished, in no acute distress Head: Eyes PERRLA, No xanthomas.   Normal cephalic and atramatic  Lungs: Clear bilaterally to auscultation and percussion. Heart: HRRR S1 S2, without MRG.  Pulses are 2+ & equal.            No carotid bruit. No JVD.  No abdominal bruits. No femoral bruits. Abdomen: Bowel sounds are positive, abdomen soft and non-tender without masses or                  Hernia's noted. Msk:  Back normal, normal gait. Normal strength and tone for age. Extremities: No clubbing, cyanosis or edema.  DP +1 Neuro: Alert and oriented X  3. Psych:  Good affect, responds appropriately  EKG:NSR with prior anterior infarct  ASSESSMENT AND PLAN

## 2013-03-22 NOTE — Assessment & Plan Note (Signed)
Blood pressure is well-controlled currently. No changes in medication.

## 2013-03-22 NOTE — Assessment & Plan Note (Signed)
Will have lipids and LFTs completed. He has lost 12 pounds and is eating much better. Will evaluate need to adjust medications based upon results.

## 2013-03-22 NOTE — Assessment & Plan Note (Signed)
He has had one recurrence of significant chest pressure after moving a heavy object one month ago. The pain did not go away after 5 minutes after he sat down to rest. He did not take nitroglycerin as he normally would have to lie down when he does so, and that was done an option while he was at work. He has had no further significant episodes, but continues to have intermittent light, episodes lasting approximately 30 seconds.  With known history of coronary artery disease frequent interventions most recent in 2011, I have discussed the case with Dr. Diona Browner, who knows this patient well. If his recommendation that no cardiac testing be completed at this time. The patient is very well aware of his symptoms, and knows to call for more significant angina symptoms. At this point would like to watch and wait and continue risk modification, Effient, aspirin, and metoprolol. I have advised him to call us for any recurrence of severe symptoms. She will followup with Dr. Diona Browner in 3 months.

## 2013-03-29 LAB — COMPREHENSIVE METABOLIC PANEL
ALT: 52 U/L (ref 0–53)
AST: 27 U/L (ref 0–37)
Albumin: 4.3 g/dL (ref 3.5–5.2)
Alkaline Phosphatase: 53 U/L (ref 39–117)
BUN: 16 mg/dL (ref 6–23)
CO2: 26 mEq/L (ref 19–32)
Calcium: 9.2 mg/dL (ref 8.4–10.5)
Chloride: 101 mEq/L (ref 96–112)
Creat: 1.19 mg/dL (ref 0.50–1.35)
Glucose, Bld: 114 mg/dL — ABNORMAL HIGH (ref 70–99)
Potassium: 3.6 mEq/L (ref 3.5–5.3)
Sodium: 139 mEq/L (ref 135–145)
Total Bilirubin: 0.5 mg/dL (ref 0.3–1.2)
Total Protein: 6.9 g/dL (ref 6.0–8.3)

## 2013-03-29 LAB — HEMOGLOBIN A1C
Hgb A1c MFr Bld: 6.1 % — ABNORMAL HIGH (ref <5.7)
Mean Plasma Glucose: 128 mg/dL — ABNORMAL HIGH (ref <117)

## 2013-04-15 ENCOUNTER — Other Ambulatory Visit: Payer: Self-pay | Admitting: Cardiology

## 2013-04-25 ENCOUNTER — Telehealth: Payer: Self-pay

## 2013-04-25 NOTE — Telephone Encounter (Signed)
Patient called and states when he wakes up in the mornings his left arm is numb and tingling.  Patient says never on right side.  Also has happened when sitting in a chair, usually sitting holding phone looking at it or playing a game.  After moving, he says goes away.  No chest pain, sob, dizziness.  Patient understands no call back until tomorrow.

## 2013-04-26 NOTE — Telephone Encounter (Signed)
Pt notes he is still experiencing this every morning for the last 2 weeks, once he gets out of bed and shakes arms/hands this does go away, pt not sure if he is sleeping on his hands/arms, also noted this happens at times in his hands throughout the day once in a while, gradually gotten worse, started 2 months ago, forgot to tell DR SM/KL at last OV, notes always in his left arm, never in his right arm, pt still denies SOB/Chest Pains, dizziness, pt advised to call PCP on Monday, if sxs worsen that do not reside with NTG if chest pain does occur to have evaluated in ED, pt understood and will call PCP on Monday to schedule apt, please advise if any instructions need to be advised on cardiac level

## 2013-04-28 NOTE — Telephone Encounter (Signed)
Sounds somewhat like neuropathic pain.  Agree with followup primary care for now  May need further imaging to exclude nerve impingement

## 2013-04-29 NOTE — Telephone Encounter (Signed)
.  left message to have patient return my call.  

## 2013-04-30 NOTE — Telephone Encounter (Signed)
Spoke to pt to advise results/instructions. Pt understood. Pt will call PCP tomorrow for apt

## 2013-05-29 ENCOUNTER — Other Ambulatory Visit: Payer: Self-pay | Admitting: *Deleted

## 2013-05-29 MED ORDER — FENOFIBRATE 48 MG PO TABS: 48.0000 mg | ORAL_TABLET | Freq: Every day | ORAL | Status: DC

## 2013-06-10 ENCOUNTER — Telehealth: Payer: Self-pay | Admitting: *Deleted

## 2013-06-10 DIAGNOSIS — I208 Other forms of angina pectoris: Secondary | ICD-10-CM

## 2013-06-10 NOTE — Telephone Encounter (Signed)
Pt has been having CP on exertion. He is scheduled for 8-13 with dr Diona Browner. The soonest we can get him in is 8-4 with Joni Reining. 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

## 2013-06-13 ENCOUNTER — Telehealth: Payer: Self-pay

## 2013-06-13 ENCOUNTER — Encounter: Payer: Self-pay | Admitting: *Deleted

## 2013-06-13 NOTE — Telephone Encounter (Signed)
Another option would be for [place] to go ahead and schedule a Lexiscan Myoview on medical therapy to evaluate ischemic burden of his known CAD. Were this to be low risk, and his symptoms were to remain stable, we might be able to manage him medically and allow him to proceed with elective surgery.

## 2013-06-13 NOTE — Telephone Encounter (Signed)
I would hold off on any elective surgery until we can further sort out his chest pain symptoms. I suspect he is having angina, has had exertional symptoms for quite some time, last PCI was in 2011. Please make sure that he follows up with me and we can discuss potential followup testing.

## 2013-06-13 NOTE — Telephone Encounter (Signed)
Called pt to advise test scheduled for 06-26-13, the instructions will be placed at the front desk of our office for his easy pick up, office hours given, advised he will need to arrive at the radiology dept at 8:30am that morning, pt accepted apt, advised after test results are noted Dr Dionicia Abler will review pt risk for surgery, pt accepted apt and will pick up instructions as well as understood verbal to hold Metoprolol the morning of his lexiscan reiterated If your signs and symptoms worsen please seek medical attention in your local Emergency Room, do not attempt to drive yourself per further endangerment, call 911 for transport or have someone drive you to the ER as soon as possible.

## 2013-06-13 NOTE — Telephone Encounter (Signed)
Placed orders in chart for pt to have myoview, printed instruction letter for test, advised manager YL pt need for apt asap, will call pt with apt date and time once received

## 2013-06-13 NOTE — Telephone Encounter (Signed)
Spoke to pt to advise results/instructions. Pt understood. Placed pt on the waiting list in case a DR SM pt cancels, pt still not satisfied with instructions, pt wanted to ask if Dr Dionicia Abler can set him up for counseling prior to his 07-03-13 OV because his hand is really hurting him and he also wants to have the surgery performed and healed prior to his vacation set up for October, offered pt to have apt with KL ON 06-24-13 to be evaluated for possible need for testing, pt declined and stated that he feels that he can not wait, reiterated If your signs and symptoms worsen please seek medical attention in your local Emergency Room, do not attempt to drive yourself per further endangerment, call 911 for transport or have someone drive you to the ER as soon as possible. Pt understood instructions, please advise if ok to overbook for pt apt per notes he would rather see DR SM if possible per knows his hx

## 2013-06-13 NOTE — Telephone Encounter (Signed)
Pt notes this has been going on for several months with chest burning sensation while exerting , pt also noted chest pain 2 weeks noted after a stressful issue while exerting and looking for his lost dog, pt noted the pain did not go away after resting as it had in the past thus pt took nitro and pain resolved, pain resolve, pt also notes while heavy lifting or walking at work  a burning sensation but will resolve when he rests for 5 minutes, pt denies SOB/swelling/headaches/dizzness has not checked his BP at all, pt notes his home BP cuff is broken however will invest in a new one asap, this does not mimic the sxs when he had his heartattack, notes very uncomfortable and seems to have worsened in the past 2 months, pt called in on 04-25-13 concerning tingling in arm however went to orthopedic and was diagnosed with carpel tunnel and will need surgery soon and wants to know if he needs to hold off on the surgery per current sxs, pt denies OTC heartburn medication per taking nexium daily, please advise

## 2013-06-26 ENCOUNTER — Encounter (HOSPITAL_COMMUNITY)
Admission: RE | Admit: 2013-06-26 | Discharge: 2013-06-26 | Disposition: A | Discharge status: Home / Self Care | Payer: BC Managed Care – PPO | Source: Ambulatory Visit | Attending: Cardiology | Admitting: Cardiology

## 2013-06-26 ENCOUNTER — Encounter (HOSPITAL_COMMUNITY): Payer: Self-pay

## 2013-06-26 DIAGNOSIS — I2510 Atherosclerotic heart disease of native coronary artery without angina pectoris: Secondary | ICD-10-CM

## 2013-06-26 DIAGNOSIS — I2089 Other forms of angina pectoris: Secondary | ICD-10-CM | POA: Insufficient documentation

## 2013-06-26 DIAGNOSIS — I208 Other forms of angina pectoris: Secondary | ICD-10-CM | POA: Insufficient documentation

## 2013-06-26 DIAGNOSIS — I209 Angina pectoris, unspecified: Secondary | ICD-10-CM

## 2013-06-26 MED ORDER — TECHNETIUM TC 99M SESTAMIBI - CARDIOLITE
30.0000 | Freq: Once | INTRAVENOUS | Status: AC | PRN
  Administered 2013-06-26: 11:00:00 30 via INTRAVENOUS

## 2013-06-26 MED ORDER — TECHNETIUM TC 99M SESTAMIBI - CARDIOLITE
10.0000 | Freq: Once | INTRAVENOUS | Status: AC | PRN
  Administered 2013-06-26: 09:00:00 10.5 via INTRAVENOUS

## 2013-06-26 MED ORDER — SODIUM CHLORIDE 0.9 % IJ SOLN
INTRAMUSCULAR | Status: AC
  Administered 2013-06-26: 10 mL via INTRAVENOUS
  Filled 2013-06-26: qty 10

## 2013-06-26 MED ORDER — REGADENOSON 0.4 MG/5ML IV SOLN
INTRAVENOUS | Status: AC
  Administered 2013-06-26: 0.4 mg via INTRAVENOUS
  Filled 2013-06-26: qty 5

## 2013-06-26 NOTE — Progress Notes (Signed)
Stress Lab Nurses Notes - Jeani Hawking  DARRELLE WIBERG 06/26/2013 Reason for doing test: CAD and Chest Pain Type of test: Marlane Hatcher Nurse performing test: Parke Poisson, RN Nuclear Medicine Tech: Lou Cal Echo Tech: Not Applicable MD performing test: Dr. Purvis Sheffield & Joni Reining NP Family MD: Dr. Phillips Odor Test explained and consent signed: yes IV started: 22g jelco, Saline lock flushed, No redness or edema and Saline lock started in radiology Symptoms: chest pressure & feeling dizziness Treatment/Intervention: None Reason test stopped: protocol completed After recovery IV was: Discontinued via X-ray tech and No redness or edema Patient to return to Nuc. Med at : 11:15 Patient discharged: Home Patient's Condition upon discharge was: stable Comments: During test BP 117/69 & HR 104 .   Recovery BP 134/67 & HR 78 .   Symptoms resolved in recovery. Erskine Speed T

## 2013-07-02 ENCOUNTER — Encounter: Payer: Self-pay | Admitting: *Deleted

## 2013-07-03 ENCOUNTER — Encounter: Payer: Self-pay | Admitting: Cardiology

## 2013-07-03 ENCOUNTER — Ambulatory Visit (INDEPENDENT_AMBULATORY_CARE_PROVIDER_SITE_OTHER): Payer: BC Managed Care – PPO | Admitting: Cardiology

## 2013-07-03 VITALS — BP 135/79 | HR 66 | Ht 72.0 in | Wt 233.5 lb

## 2013-07-03 DIAGNOSIS — Z01810 Encounter for preprocedural cardiovascular examination: Secondary | ICD-10-CM | POA: Insufficient documentation

## 2013-07-03 DIAGNOSIS — I2510 Atherosclerotic heart disease of native coronary artery without angina pectoris: Secondary | ICD-10-CM

## 2013-07-03 DIAGNOSIS — I10 Essential (primary) hypertension: Secondary | ICD-10-CM

## 2013-07-03 DIAGNOSIS — E785 Hyperlipidemia, unspecified: Secondary | ICD-10-CM

## 2013-07-03 NOTE — Assessment & Plan Note (Signed)
Patient being considered for elective carpal tunnel release. He has stable angina symptoms, known CAD as outlined above on medical therapy, and recent followup Myoview demonstrating scar but no active ischemia, LVEF 50%. Based on this, he should be able to proceed at an acceptable perioperative cardiac risk. He will need to hold aspirin and Effient for 7 days prior to planned surgical date, as yet to be determined. Followup arranged.

## 2013-07-03 NOTE — Patient Instructions (Addendum)
Your physician recommends that you schedule a follow-up appointment in: 4 MONTHS WITH DR SM  HAVE YOUR SURGEON CONTACT OUR OFFICE WITH YOUR SURGERY DATE TO HAVE ADJUSTMENTS MADE FOR YOUR ASPIRIN AND EFFIENT REGIMEN

## 2013-07-03 NOTE — Assessment & Plan Note (Signed)
Continue medical therapy and observation, recent testing reviewed.

## 2013-07-03 NOTE — Assessment & Plan Note (Signed)
No change in current regimen. 

## 2013-07-03 NOTE — Assessment & Plan Note (Signed)
Continues on statin therapy. 

## 2013-07-03 NOTE — Progress Notes (Signed)
Clinical Summary Mr. Riner is a 52 y.o.male last seen in May by Ms. Lawrence NP. He is being considered for carpal tunnel surgery. In the setting of angina symptoms with known CAD, he was referred for preoperative ischemic workup.  Lexiscan Myoview on 7/24 demonstrated no diagnostic ST segment abnormalities. There were fixed defects in the anteroseptal/anteroapical and inferior/inferoseptal walls consistent with scar. No active ischemia. LVEF 50%. I reviewed this with him.   He reports occasional exertional angina over the last 6 months, not progressing, none in the last 3 weeks. States he has been compliant with his medications.   Allergies  Allergen Reactions  . Reopro [Abciximab] Other (See Comments)    Potassium level dropped    Current Outpatient Prescriptions  Medication Sig Dispense Refill  . aspirin 325 MG tablet Take 325 mg by mouth daily.        [name] esomeprazole (NEXIUM) 40 MG capsule Take 1 capsule (40 mg total) by mouth daily.  90 capsule  4  . fenofibrate (TRICOR) 48 MG tablet Take 1 tablet (48 mg total) by mouth daily.  90 tablet  3  . meloxicam (MOBIC) 15 MG tablet Take 15 mg by mouth daily.       . metoprolol succinate (TOPROL-XL) 50 MG 24 hr tablet Take 1 tablet (50 mg total) by mouth 2 (two) times daily. Take with or immediately following a meal.  270 tablet  3  . Multiple Vitamin (MULTIVITAMIN) tablet Take 1 tablet by mouth daily.        . nitroGLYCERIN (NITROSTAT) 0.4 MG SL tablet Place 1 tablet (0.4 mg total) under the tongue every 5 (five) minutes as needed. For chest pain  25 tablet  6  . Omega-3 Fatty Acids (FISH OIL) 1000 MG CAPS Take 2 capsules by mouth 2 (two) times daily.        . prasugrel (EFFIENT) 10 MG TABS Take 1 tablet (10 mg total) by mouth daily.  30 tablet  6  . rosuvastatin (CRESTOR) 20 MG tablet Take 20 mg by mouth at bedtime.        . traMADol-acetaminophen (ULTRACET) 37.5-325 MG per tablet Take 1 tablet by mouth every 8 (eight) hours as needed.        . valsartan-hydrochlorothiazide (DIOVAN-HCT) 320-25 MG per tablet TAKE 1 TABLET DAILY  90 tablet  2  . [DISCONTINUED] sodium chloride (OCEAN NASAL SPRAY) 0.65 % nasal spray Place 1 spray into the nose as needed for congestion.  30 mL  12   No current facility-administered medications for this visit.    Past Medical History  Diagnosis Date  . Coronary atherosclerosis of native coronary artery     Remote stent LAD, subsequent stents RCA with restenosis, DES to RCA and circ 8/08 in setting of restenosis, DES LAD and DES RCA 3/11  . Hyperlipidemia   . Essential hypertension, benign   . Hepatic steatosis   . Obstructive sleep apnea   . MI (myocardial infarction)     Anterior    Social History Mr. Tarver reports that he has quit smoking. His smoking use included Cigarettes. He smoked 0.00 packs per day. He has never used smokeless tobacco. Mr. Poitra reports that he does not drink alcohol.  Review of Systems No palpitations, bleeding problems, claudication, orthopnea or PND. Symptoms related to carpal tunnel syndrome, worse on the left. Otherwise negative.  Physical Examination Filed Vitals:   07/03/13 0827  BP: 135/79  Pulse: 66   Filed Weights   07/03/13 0827  Weight: 233 lb 8 oz (105.915 kg)    Comfortable at rest. HEENT: Conjunctiva and lids normal, oropharynx clear.  Neck: Supple, no elevated jugular venous pressure.  Lungs: Clear to auscultation, nonlabored.  Cardiac: Regular rate and rhythm, no S3 gallop, no loud systolic murmur. PMI indistinct.  Extremities: No pitting edema, distal pulses one plus.   Problem List and Plan   Preoperative cardiovascular examination Patient being considered for elective carpal tunnel release. He has stable angina symptoms, known CAD as outlined above on medical therapy, and recent followup Myoview demonstrating scar but no active ischemia, LVEF 50%. Based on this, he should be able to proceed at an acceptable perioperative cardiac risk.  He will need to hold aspirin and Effient for 7 days prior to planned surgical date, as yet to be determined. Followup arranged.  CORONARY ATHEROSCLEROSIS NATIVE CORONARY ARTERY Continue medical therapy and observation, recent testing reviewed.  Essential hypertension, benign No change in current regimen.  DYSLIPIDEMIA Continues on statin therapy.    Jonelle Sidle, M.D., F.A.C.C.

## 2013-07-29 ENCOUNTER — Other Ambulatory Visit: Payer: Self-pay | Admitting: Cardiology

## 2013-08-01 ENCOUNTER — Other Ambulatory Visit: Payer: Self-pay | Admitting: *Deleted

## 2013-08-01 ENCOUNTER — Other Ambulatory Visit: Payer: Self-pay | Admitting: Cardiology

## 2013-08-01 MED ORDER — METOPROLOL SUCCINATE ER 50 MG PO TB24: 50.0000 mg | ORAL_TABLET | Freq: Every day | ORAL | Status: DC

## 2013-08-02 ENCOUNTER — Telehealth: Payer: Self-pay | Admitting: *Deleted

## 2013-08-02 MED ORDER — METOPROLOL SUCCINATE ER 50 MG PO TB24: 50.0000 mg | ORAL_TABLET | Freq: Every day | ORAL | Status: DC

## 2013-08-02 NOTE — Telephone Encounter (Signed)
Pt needs metoprolol calle din to [facility] in [place] [facility] can not [facility] him his for three weeks and his completely out

## 2013-08-08 ENCOUNTER — Telehealth: Payer: Self-pay | Admitting: Cardiology

## 2013-08-08 NOTE — Telephone Encounter (Signed)
Rx was filled for metoprolol 50mg  daily.  Pt states he had a 25mg  tablet and took 25mg  in am and 50mg  in pm.  Pt will use up the 50mg  tablets then Rx will be cahanged before next refill.

## 2013-08-08 NOTE — Telephone Encounter (Signed)
Patient's RX was called in wrong he says.  Please return call/tgs

## 2013-08-13 ENCOUNTER — Encounter: Payer: Self-pay | Admitting: Cardiology

## 2013-10-01 ENCOUNTER — Encounter: Payer: Self-pay | Admitting: Cardiology

## 2013-10-01 ENCOUNTER — Ambulatory Visit (INDEPENDENT_AMBULATORY_CARE_PROVIDER_SITE_OTHER): Payer: BC Managed Care – PPO | Admitting: Cardiology

## 2013-10-01 VITALS — BP 114/62 | HR 83 | Ht 73.0 in | Wt 245.0 lb

## 2013-10-01 DIAGNOSIS — E785 Hyperlipidemia, unspecified: Secondary | ICD-10-CM

## 2013-10-01 DIAGNOSIS — I10 Essential (primary) hypertension: Secondary | ICD-10-CM

## 2013-10-01 DIAGNOSIS — I2510 Atherosclerotic heart disease of native coronary artery without angina pectoris: Secondary | ICD-10-CM

## 2013-10-01 MED ORDER — ISOSORBIDE MONONITRATE ER 30 MG PO TB24: 15.0000 mg | ORAL_TABLET | Freq: Every day | ORAL | Status: DC

## 2013-10-01 NOTE — Progress Notes (Signed)
Clinical Summary [name] is a 52 y.o.male last seen in August. He reports tolerating carpal tunnel surgery well since the last visit. Was out of work for 7 weeks, also on a cruise with his wife. 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 ECG done today shows no acute changes.  Lexiscan Myoview on 7/24 demonstrated no diagnostic ST segment abnormalities. There were fixed defects in the anteroseptal/anteroapical and inferior/inferoseptal walls consistent with scar. No active ischemia. LVEF 50%.  We reviewed his medications. We discussed further modifications in treatment, realizing that we can always reconsider cardiac catheterization if we are not able to obtain adequate angina control.  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 Hopes that less stress will also help his symptoms.   Allergies  Allergen Reactions  . Reopro [Abciximab] Other (See Comments)    Potassium level dropped    Current Outpatient Prescriptions  Medication Sig Dispense Refill  . aspirin 325 MG tablet Take 325 mg by mouth daily.        [name] esomeprazole (NEXIUM) 40 MG capsule Take 1 capsule (40 mg total) by mouth daily.  90 capsule  4  . fenofibrate (TRICOR) 48 MG tablet Take 1 tablet (48 mg total) by mouth daily.  90 tablet  3  . isosorbide mononitrate (IMDUR) 30 MG 24 hr tablet Take 0.5 tablets (15 mg total) by mouth daily.  90 tablet  1  . meloxicam (MOBIC) 15 MG tablet Take 15 mg by mouth daily.       . metoprolol succinate (TOPROL-XL) 25 MG 24 hr tablet Take 75 mg by mouth daily. Take 25mg  in morning and 50mg  in evening      . Multiple Vitamin (MULTIVITAMIN) tablet Take 1 tablet by mouth daily.        . nitroGLYCERIN (NITROSTAT) 0.4 MG SL tablet Place 1 tablet (0.4 mg total) under the tongue every 5 (five) minutes as needed. For chest pain  25 tablet  6  . Omega-3  Fatty Acids (FISH OIL) 1000 MG CAPS Take 2 capsules by mouth 2 (two) times daily.        . prasugrel (EFFIENT) 10 MG TABS Take 1 tablet (10 mg total) by mouth daily.  30 tablet  6  . rosuvastatin (CRESTOR) 20 MG tablet Take 20 mg by mouth at bedtime.        . traMADol-acetaminophen (ULTRACET) 37.5-325 MG per tablet Take 1 tablet by mouth every 8 (eight) hours as needed.       . valsartan-hydrochlorothiazide (DIOVAN-HCT) 320-25 MG per tablet TAKE 1 TABLET DAILY  90 tablet  2  . [DISCONTINUED] sodium chloride (OCEAN NASAL SPRAY) 0.65 % nasal spray Place 1 spray into the nose as needed for congestion.  30 mL  12   No current facility-administered medications for this visit.    Past Medical History  Diagnosis Date  . Coronary atherosclerosis of native coronary artery     Remote stent LAD, subsequent stents RCA with restenosis, DES to RCA and circ 8/08 in setting of restenosis, DES LAD and DES RCA 3/11  . Hyperlipidemia   . Essential hypertension, benign   . Hepatic steatosis   . Obstructive sleep apnea   . MI (myocardial infarction)     Anterior    Social History [name] reports that he has quit smoking. His smoking use included Cigarettes.  He smoked 0.00 packs per day. He has never used smokeless tobacco. [name] reports that he does not drink alcohol.  Review of Systems No palpitations or syncope. No spontaneous bleeding episodes. Stable appetite. No orthopnea or PND. Otherwise negative.  Physical Examination Filed Vitals:   10/01/13 1112  BP: 114/62  Pulse: 83   Filed Weights   10/01/13 1112  Weight: 245 lb (111.131 kg)    Comfortable at rest.  HEENT: Conjunctiva and lids normal, oropharynx clear.  Neck: Supple, no elevated jugular venous pressure.  Lungs: Clear to auscultation, nonlabored.  Cardiac: Regular rate and rhythm, no S3 gallop, no loud systolic murmur. PMI indistinct.  Abdomen: Soft, nontender. Extremities: No pitting edema, distal pulses one plus.  Skin:  Warm and dry. Musculoskeletal: No kyphosis. Neuropsychiatric: Alert and oriented x3, affect appropriate.   Problem List and Plan   CORONARY ATHEROSCLEROSIS NATIVE CORONARY ARTERY Patient reporting regular exertional angina, worse since being back to work. Plan is to continue medical therapy for now, start Imdur beginning at 15 mg daily and advancing from there. If we are not able to obtain reasonable symptom control, we may need to discuss followup cardiac catheterization. Followup arranged. He continues on aspirin and Effient - can provide samples if needed.  DYSLIPIDEMIA Continues on Crestor. Can provide samples if needed.  Essential hypertension, benign Blood pressure well controlled today.    Jonelle Sidle, M.D., F.A.C.C.

## 2013-10-01 NOTE — Assessment & Plan Note (Addendum)
Patient reporting regular exertional angina, worse since being back to work. Plan is to continue medical therapy for now, start Imdur beginning at 15 mg daily and advancing from there. If we are not able to obtain reasonable symptom control, we may need to discuss followup cardiac catheterization. Followup arranged. He continues on aspirin and Effient - can provide samples if needed.

## 2013-10-01 NOTE — Assessment & Plan Note (Signed)
Continues on Crestor. Can provide samples if needed.

## 2013-10-01 NOTE — Patient Instructions (Addendum)
Your physician wants you to follow-up in: 3 MONTHS You will receive a reminder letter in the mail two months in advance. If you don't receive a letter, please call our office to schedule the follow-up appointment.  Your physician has recommended you make the following change in your medication:   1) START TAKING IMDUR 15MG  ONCE EVERY EVENING (TAKE HALF OF THE 30MG  TABLET PRESCRIBED)

## 2013-10-01 NOTE — Assessment & Plan Note (Signed)
Blood pressure well-controlled today. 

## 2013-10-09 ENCOUNTER — Telehealth: Payer: Self-pay | Admitting: *Deleted

## 2013-10-09 MED ORDER — METOPROLOL SUCCINATE ER 25 MG PO TB24: 75.0000 mg | ORAL_TABLET | Freq: Every day | ORAL | Status: DC

## 2013-10-09 NOTE — Telephone Encounter (Signed)
Medication sent via escribe.  

## 2013-10-09 NOTE — Telephone Encounter (Signed)
Pt is calling to have [place] get refill for [facility]prolol [facility]. States that he has had problems getting it filled the last couple times. F [phone]

## 2013-10-10 ENCOUNTER — Telehealth: Payer: Self-pay | Admitting: Cardiology

## 2013-10-10 NOTE — Telephone Encounter (Signed)
Please return call regarding refill issues he is having. / tgs

## 2013-10-11 MED ORDER — METOPROLOL SUCCINATE ER 25 MG PO TB24: 75.0000 mg | ORAL_TABLET | Freq: Every day | ORAL | Status: DC

## 2013-10-11 NOTE — Telephone Encounter (Signed)
Pt needed 90 day supply of toprolol sent [facility] Refill sent

## 2013-10-15 ENCOUNTER — Telehealth: Payer: Self-pay | Admitting: Cardiology

## 2013-10-15 NOTE — Telephone Encounter (Signed)
.  left message to have patient return my call.  

## 2013-10-15 NOTE — Telephone Encounter (Signed)
Please give him samples for Effient and Crestor - these were the two that we discussed.

## 2013-10-15 NOTE — Telephone Encounter (Signed)
Patient states that he was told he could get samples for all his meds due to losing his insurance. / tgs

## 2013-10-15 NOTE — Telephone Encounter (Signed)
Please advise if any medications can be switched to a cheaper medication per only samples in office to offer are the effient and crestor

## 2013-10-16 NOTE — Telephone Encounter (Signed)
Called pt to advise the crestor samples have been placed up front for his pick up, per no effient at this time, pt advised he is leaving for [place] on Monday and will be out of town for 3 weeks, advised I can call the [place] office to see if they have any samples at this time and offer him a number to contact [facility] to have him set up on a pt assistance program, this nurse contacted the [place] office whom advised they do have three weeks worth and will have them waiting for his pick up today by 5pm per the office will be closed for the holiday until Monday, pt understood phone number given to [facility] and financial aid package for effient placed with samples at the front desk for pick up. pt will call office with any further concerns if noted

## 2013-11-01 ENCOUNTER — Ambulatory Visit: Payer: BC Managed Care – PPO | Admitting: Cardiology

## 2013-11-07 ENCOUNTER — Ambulatory Visit: Payer: BC Managed Care – PPO | Admitting: Cardiology

## 2013-11-11 ENCOUNTER — Other Ambulatory Visit: Payer: Self-pay | Admitting: Adult Health

## 2013-11-19 ENCOUNTER — Telehealth: Payer: Self-pay | Admitting: Cardiology

## 2013-11-19 NOTE — Telephone Encounter (Signed)
Refill clarification / please see paper in refill bin / tgs

## 2014-01-16 ENCOUNTER — Ambulatory Visit: Payer: BC Managed Care – PPO | Admitting: Cardiology

## 2014-01-21 ENCOUNTER — Encounter (INDEPENDENT_AMBULATORY_CARE_PROVIDER_SITE_OTHER): Payer: Self-pay

## 2014-01-21 ENCOUNTER — Encounter: Payer: Self-pay | Admitting: Cardiology

## 2014-01-21 ENCOUNTER — Ambulatory Visit (INDEPENDENT_AMBULATORY_CARE_PROVIDER_SITE_OTHER): Payer: Commercial Indemnity | Admitting: Cardiology

## 2014-01-21 VITALS — BP 136/83 | HR 82 | Ht 72.0 in | Wt 241.0 lb

## 2014-01-21 DIAGNOSIS — E119 Type 2 diabetes mellitus without complications: Secondary | ICD-10-CM | POA: Insufficient documentation

## 2014-01-21 DIAGNOSIS — E785 Hyperlipidemia, unspecified: Secondary | ICD-10-CM

## 2014-01-21 DIAGNOSIS — I2510 Atherosclerotic heart disease of native coronary artery without angina pectoris: Secondary | ICD-10-CM

## 2014-01-21 MED ORDER — ISOSORBIDE MONONITRATE ER 30 MG PO TB24: 30.0000 mg | ORAL_TABLET | Freq: Every day | ORAL | Status: DC

## 2014-01-21 MED ORDER — VALSARTAN-HYDROCHLOROTHIAZIDE 320-25 MG PO TABS: ORAL_TABLET | ORAL | Status: DC

## 2014-01-21 MED ORDER — ROSUVASTATIN CALCIUM 20 MG PO TABS
20.0000 mg | ORAL_TABLET | Freq: Every day | ORAL | Status: DC
Start: 2014-01-21 — End: 2015-09-14

## 2014-01-21 MED ORDER — ESOMEPRAZOLE MAGNESIUM 40 MG PO CPDR: 40.0000 mg | DELAYED_RELEASE_CAPSULE | Freq: Every day | ORAL | Status: DC

## 2014-01-21 MED ORDER — FENOFIBRATE 48 MG PO TABS: 48.0000 mg | ORAL_TABLET | Freq: Every day | ORAL | Status: DC

## 2014-01-21 MED ORDER — METOPROLOL SUCCINATE ER 25 MG PO TB24: 75.0000 mg | ORAL_TABLET | Freq: Every day | ORAL | Status: DC

## 2014-01-21 MED ORDER — PRASUGREL HCL 10 MG PO TABS: ORAL_TABLET | ORAL | Status: DC

## 2014-01-21 NOTE — Assessment & Plan Note (Signed)
Stable angina as outlined above. Plan is to continue medical therapy and observation for now.

## 2014-01-21 NOTE — Assessment & Plan Note (Signed)
Reports having significantly elevated triglycerides per recent evaluation by primary care. Triglycerides level was 212 approximately 2 years ago. Probably elevated with concurrent diabetes mellitus. He is on omega-3 supplements, Crestor, and fibrate. Keep followup with primary care for lab work.

## 2014-01-21 NOTE — Patient Instructions (Addendum)
Your physician wants you to follow-up in: 6 months You will receive a reminder letter in the mail two months in advance. If you don't receive a letter, please call our office to schedule the follow-up appointment.     Your physician recommends that you continue on your current medications as directed. Please refer to the Current Medication list given to you today.      Thank you for choosing [facility] !        

## 2014-01-21 NOTE — Assessment & Plan Note (Signed)
Diagnosed since last office visit. He is on oral agents, working on his diet as well. Keep follow up with primary care.

## 2014-01-21 NOTE — Progress Notes (Signed)
Clinical Summary [name] is a 53 y.o.male last seen in November 2014. He has been doing fairly well, no progressive angina. He has changed jobs and states he is under much less stress, sleeping better since he is working on the first shift now. He states that he was diagnosed with type 2 diabetes mellitus since our last visit, now on oral agents as detailed below. He is trying to cut back carbohydrates in his diet, has not lost significant weight as yet. Also reports having significantly elevated triglycerides. He will be having followup lab work with his primary care provider soon.  Exertional angina has been stable, fairly brief. He did increase his Imdur to 30 mg daily. No other change in his cardiac regimen.  Lexiscan Myoview from July 2014 demonstrated no diagnostic ST segment abnormalities. There were fixed defects in the anteroseptal/anteroapical and inferior/inferoseptal walls consistent with scar. No active ischemia. LVEF 50%.   Allergies  Allergen Reactions  . Reopro [Abciximab] Other (See Comments)    Potassium level dropped    Current Outpatient Prescriptions  Medication Sig Dispense Refill  . aspirin 325 MG tablet Take 325 mg by mouth daily.        [name] esomeprazole (NEXIUM) 40 MG capsule Take 1 capsule (40 mg total) by mouth daily.  90 capsule  3  . fenofibrate (TRICOR) 48 MG tablet Take 1 tablet (48 mg total) by mouth daily.  90 tablet  3  . glipiZIDE (GLUCOTROL XL) 2.5 MG 24 hr tablet Take 2.5 mg by mouth daily with breakfast.      . isosorbide mononitrate (IMDUR) 30 MG 24 hr tablet Take 1 tablet (30 mg total) by mouth daily.  90 tablet  3  . metFORMIN (GLUCOPHAGE) 500 MG tablet Take 1,000 mg by mouth daily.      . metoprolol succinate (TOPROL-XL) 25 MG 24 hr tablet Take 3 tablets (75 mg total) by mouth daily. Take 25mg  in morning and 50mg  in evening  270 tablet  3  . Multiple Vitamin (MULTIVITAMIN) tablet Take 1 tablet by mouth daily.        . nitroGLYCERIN (NITROSTAT)  0.4 MG SL tablet Place 1 tablet (0.4 mg total) under the tongue every 5 (five) minutes as needed. For chest pain  25 tablet  6  . Omega-3 Fatty Acids (FISH OIL) 1000 MG CAPS Take 2 capsules by mouth 2 (two) times daily.        . prasugrel (EFFIENT) 10 MG TABS tablet TAKE ONE TABLET BY MOUTH ONCE DAILY  90 tablet  3  . rosuvastatin (CRESTOR) 20 MG tablet Take 1 tablet (20 mg total) by mouth at bedtime.  90 tablet  3  . sitaGLIPtin (JANUVIA) 100 MG tablet Take 100 mg by mouth daily.      . traMADol-acetaminophen (ULTRACET) 37.5-325 MG per tablet Take 1 tablet by mouth every 8 (eight) hours as needed.       . valsartan-hydrochlorothiazide (DIOVAN-HCT) 320-25 MG per tablet TAKE 1 TABLET DAILY  90 tablet  3  . [DISCONTINUED] sodium chloride (OCEAN NASAL SPRAY) 0.65 % nasal spray Place 1 spray into the nose as needed for congestion.  30 mL  12   No current facility-administered medications for this visit.    Past Medical History  Diagnosis Date  . Coronary atherosclerosis of native coronary artery     Remote stent LAD, subsequent stents RCA with restenosis, DES to RCA and circ 8/08 in setting of restenosis, DES LAD and DES RCA 3/11  .  Hyperlipidemia   . Essential hypertension, benign   . Hepatic steatosis   . Obstructive sleep apnea   . MI (myocardial infarction)     Anterior    Social History Mr. Geralds reports that he has quit smoking. His smoking use included Cigarettes. He smoked 0.00 packs per day. He has never used smokeless tobacco. Mr. Wiacek reports that he does not drink alcohol.  Review of Systems No palpitations, dizziness, claudication, syncope. Nocturia has improved. Otherwise negative.  Physical Examination Filed Vitals:   01/21/14 1256  BP: 136/83  Pulse: 82   Filed Weights   01/21/14 1256  Weight: 241 lb (109.317 kg)    Comfortable at rest.  HEENT: Conjunctiva and lids normal, oropharynx clear.  Neck: Supple, no elevated jugular venous pressure.  Lungs: Clear to  auscultation, nonlabored.  Cardiac: Regular rate and rhythm, no S3 gallop, no loud systolic murmur. PMI indistinct.  Abdomen: Soft, nontender.  Extremities: No pitting edema, distal pulses one plus.  Skin: Warm and dry.  Musculoskeletal: No kyphosis.  Neuropsychiatric: Alert and oriented x3, affect appropriate.   Problem List and Plan   CORONARY ATHEROSCLEROSIS NATIVE CORONARY ARTERY Stable angina as outlined above. Plan is to continue medical therapy and observation for now.  Type 2 diabetes mellitus Diagnosed since last office visit. He is on oral agents, working on his diet as well. Keep follow up with primary care.  DYSLIPIDEMIA Reports having significantly elevated triglycerides per recent evaluation by primary care. Triglycerides level was 212 approximately 2 years ago. Probably elevated with concurrent diabetes mellitus. He is on omega-3 supplements, Crestor, and fibrate. Keep followup with primary care for lab work.    Satira Sark, M.D., F.A.C.C.

## 2014-03-13 ENCOUNTER — Encounter (INDEPENDENT_AMBULATORY_CARE_PROVIDER_SITE_OTHER): Payer: Self-pay | Admitting: *Deleted

## 2014-06-12 ENCOUNTER — Encounter (INDEPENDENT_AMBULATORY_CARE_PROVIDER_SITE_OTHER): Payer: Self-pay | Admitting: *Deleted

## 2014-07-21 ENCOUNTER — Institutional Professional Consult (permissible substitution) (INDEPENDENT_AMBULATORY_CARE_PROVIDER_SITE_OTHER): Payer: Commercial Indemnity | Admitting: Internal Medicine

## 2014-08-04 ENCOUNTER — Ambulatory Visit (INDEPENDENT_AMBULATORY_CARE_PROVIDER_SITE_OTHER): Payer: Commercial Indemnity | Admitting: Cardiology

## 2014-08-04 ENCOUNTER — Encounter: Payer: Self-pay | Admitting: Cardiology

## 2014-08-04 VITALS — BP 148/88 | HR 97 | Ht 72.0 in | Wt 234.0 lb

## 2014-08-04 DIAGNOSIS — E785 Hyperlipidemia, unspecified: Secondary | ICD-10-CM

## 2014-08-04 DIAGNOSIS — I2510 Atherosclerotic heart disease of native coronary artery without angina pectoris: Secondary | ICD-10-CM

## 2014-08-04 DIAGNOSIS — I10 Essential (primary) hypertension: Secondary | ICD-10-CM

## 2014-08-04 DIAGNOSIS — I25119 Atherosclerotic heart disease of native coronary artery with unspecified angina pectoris: Secondary | ICD-10-CM

## 2014-08-04 DIAGNOSIS — E1159 Type 2 diabetes mellitus with other circulatory complications: Secondary | ICD-10-CM

## 2014-08-04 DIAGNOSIS — I209 Angina pectoris, unspecified: Secondary | ICD-10-CM

## 2014-08-04 NOTE — Assessment & Plan Note (Signed)
Continues on Crestor and omega-3 tablets. Check FLP and LFT for next visit.

## 2014-08-04 NOTE — Assessment & Plan Note (Signed)
Symptomatically stable on medical therapy. Continue observation, 6 month followup arranged.

## 2014-08-04 NOTE — Assessment & Plan Note (Signed)
Keep followup with primary care.

## 2014-08-04 NOTE — Progress Notes (Signed)
Clinical Summary [name] is a 53 y.o.male last seen in March. He remained stable from a cardiac perspective with occasional angina symptoms, not progressive. He reports compliance with medications.  ECG today shows sinus rhythm with leftward axis, evidence of previous inferior infarct, poor R wave progression.  Lexiscan Myoview from July 2014 demonstrated no diagnostic ST segment abnormalities. There were fixed defects in the anteroseptal/anteroapical and inferior/inferoseptal walls consistent with scar. No active ischemia. LVEF 50%.  He has been under increased stress, currently remodeling a house, his mother has also been ill with heart disease.   Allergies  Allergen Reactions  . Reopro [Abciximab] Other (See Comments)    Potassium level dropped    Current Outpatient Prescriptions  Medication Sig Dispense Refill  . aspirin 325 MG tablet Take 325 mg by mouth daily.        [name] esomeprazole (NEXIUM) 40 MG capsule Take 1 capsule (40 mg total) by mouth daily.  90 capsule  3  . fenofibrate (TRICOR) 48 MG tablet Take 1 tablet (48 mg total) by mouth daily.  90 tablet  3  . glipiZIDE (GLUCOTROL XL) 2.5 MG 24 hr tablet Take 2.5 mg by mouth daily with breakfast.      . isosorbide mononitrate (IMDUR) 30 MG 24 hr tablet Take 1 tablet (30 mg total) by mouth daily.  90 tablet  3  . metoprolol succinate (TOPROL-XL) 25 MG 24 hr tablet Take 3 tablets (75 mg total) by mouth daily. Take 25mg  in morning and 50mg  in evening  270 tablet  3  . Multiple Vitamin (MULTIVITAMIN) tablet Take 1 tablet by mouth daily.        . nitroGLYCERIN (NITROSTAT) 0.4 MG SL tablet Place 1 tablet (0.4 mg total) under the tongue every 5 (five) minutes as needed. For chest pain  25 tablet  6  . Omega-3 Fatty Acids (FISH OIL) 1000 MG CAPS Take 2 capsules by mouth 2 (two) times daily.        . prasugrel (EFFIENT) 10 MG TABS tablet TAKE ONE TABLET BY MOUTH ONCE DAILY  90 tablet  3  . rosuvastatin (CRESTOR) 20 MG tablet Take 1  tablet (20 mg total) by mouth at bedtime.  90 tablet  3  . sitaGLIPtin-metformin (JANUMET) 50-500 MG per tablet Take 1 tablet by mouth daily.      . traMADol-acetaminophen (ULTRACET) 37.5-325 MG per tablet Take 1 tablet by mouth every 8 (eight) hours as needed.       . valsartan-hydrochlorothiazide (DIOVAN-HCT) 320-25 MG per tablet TAKE 1 TABLET DAILY  90 tablet  3  . [DISCONTINUED] sodium chloride (OCEAN NASAL SPRAY) 0.65 % nasal spray Place 1 spray into the nose as needed for congestion.  30 mL  12   No current facility-administered medications for this visit.    Past Medical History  Diagnosis Date  . Coronary atherosclerosis of native coronary artery     Remote stent LAD, subsequent stents RCA with restenosis, DES to RCA and circ 8/08 in setting of restenosis, DES LAD and DES RCA 3/11  . Hyperlipidemia   . Essential hypertension, benign   . Hepatic steatosis   . Obstructive sleep apnea   . MI (myocardial infarction)     Anterior    Social History [name] reports that he quit smoking about 14 years ago. His smoking use included Cigarettes. He smoked 0.00 packs per day. He has never used smokeless tobacco. [name] reports that he does not drink alcohol.  Review  of Systems No palpitations or syncope. Reports stable appetite and diet, has lost a few pounds. Other systems reviewed and negative except as outlined.  Physical Examination Filed Vitals:   08/04/14 1621  BP: 148/88  Pulse: 97   Filed Weights   08/04/14 1621  Weight: 234 lb (106.142 kg)    Comfortable at rest.  HEENT: Conjunctiva and lids normal, oropharynx clear.  Neck: Supple, no elevated jugular venous pressure.  Lungs: Clear to auscultation, nonlabored.  Cardiac: Regular rate and rhythm, no S3 gallop, no loud systolic murmur. PMI indistinct.  Abdomen: Soft, nontender.  Extremities: No pitting edema, distal pulses one plus.  Skin: Warm and dry.  Musculoskeletal: No kyphosis.  Neuropsychiatric: Alert and  oriented x3, affect appropriate.   Problem List and Plan   CORONARY ATHEROSCLEROSIS NATIVE CORONARY ARTERY Symptomatically stable on medical therapy. Continue observation, 6 month followup arranged.  DYSLIPIDEMIA Continues on Crestor and omega-3 tablets. Check FLP and LFT for next visit.  Type 2 diabetes mellitus Keep followup with primary care.    Satira Sark, M.D., F.A.C.C.

## 2014-08-04 NOTE — Patient Instructions (Signed)
Your physician wants you to follow-up in: 6 months You will receive a reminder letter in the mail two months in advance. If you don't receive a letter, please call our office to schedule the follow-up appointment.     Your physician recommends that you continue on your current medications as directed. Please refer to the Current Medication list given to you today.     Please have blood work drawn just before next visit (LIPID,LFT'S)     Thank you for choosing [facility] !

## 2014-12-04 ENCOUNTER — Other Ambulatory Visit: Payer: Self-pay | Admitting: Cardiology

## 2014-12-05 ENCOUNTER — Other Ambulatory Visit: Payer: Self-pay

## 2014-12-05 ENCOUNTER — Other Ambulatory Visit: Payer: Self-pay | Admitting: *Deleted

## 2014-12-05 MED ORDER — FENOFIBRATE 48 MG PO TABS: 48.0000 mg | ORAL_TABLET | Freq: Every day | ORAL | Status: DC

## 2014-12-05 MED ORDER — PRASUGREL HCL 10 MG PO TABS: ORAL_TABLET | ORAL | Status: DC

## 2014-12-05 MED ORDER — ISOSORBIDE MONONITRATE ER 30 MG PO TB24: 30.0000 mg | ORAL_TABLET | Freq: Every day | ORAL | Status: DC

## 2014-12-05 MED ORDER — VALSARTAN-HYDROCHLOROTHIAZIDE 320-25 MG PO TABS: ORAL_TABLET | ORAL | Status: DC

## 2014-12-05 MED ORDER — METOPROLOL SUCCINATE ER 25 MG PO TB24: 75.0000 mg | ORAL_TABLET | Freq: Every day | ORAL | Status: DC

## 2014-12-05 MED ORDER — ESOMEPRAZOLE MAGNESIUM 40 MG PO CPDR: 40.0000 mg | DELAYED_RELEASE_CAPSULE | Freq: Every day | ORAL | Status: DC

## 2014-12-05 NOTE — Telephone Encounter (Signed)
Refill complete 

## 2014-12-10 ENCOUNTER — Other Ambulatory Visit: Payer: Self-pay | Admitting: Cardiology

## 2014-12-10 MED ORDER — METOPROLOL SUCCINATE ER 25 MG PO TB24: 75.0000 mg | ORAL_TABLET | Freq: Every day | ORAL | Status: DC

## 2014-12-10 NOTE — Telephone Encounter (Signed)
Please see refill bin / tgs  °

## 2015-02-10 ENCOUNTER — Ambulatory Visit (INDEPENDENT_AMBULATORY_CARE_PROVIDER_SITE_OTHER): Payer: BLUE CROSS/BLUE SHIELD | Admitting: Cardiology

## 2015-02-10 ENCOUNTER — Encounter: Payer: Self-pay | Admitting: Cardiology

## 2015-02-10 VITALS — BP 118/72 | HR 80 | Ht 73.0 in | Wt 218.4 lb

## 2015-02-10 DIAGNOSIS — E785 Hyperlipidemia, unspecified: Secondary | ICD-10-CM

## 2015-02-10 DIAGNOSIS — E1159 Type 2 diabetes mellitus with other circulatory complications: Secondary | ICD-10-CM

## 2015-02-10 DIAGNOSIS — I2510 Atherosclerotic heart disease of native coronary artery without angina pectoris: Secondary | ICD-10-CM

## 2015-02-10 DIAGNOSIS — I10 Essential (primary) hypertension: Secondary | ICD-10-CM

## 2015-02-10 MED ORDER — VALSARTAN-HYDROCHLOROTHIAZIDE 160-12.5 MG PO TABS: 1.0000 | ORAL_TABLET | Freq: Every day | ORAL | Status: DC

## 2015-02-10 NOTE — Patient Instructions (Addendum)
Your physician wants you to follow-up in: 6 months with Dr Ferne Reus will receive a reminder letter in the mail two months in advance. If you don't receive a letter, please call our office to schedule the follow-up appointment.   DECREASE Diovan HCT to 160-12.5 mg daily    Call when you need rx sent to pharmacy     Thank you for choosing [facility] !

## 2015-02-10 NOTE — Progress Notes (Signed)
Cardiology Office Note  Date: 02/10/2015   ID: [name], DOB Sep 10, 1961, MRN [id number]  PCP: Leonides Grills, MD  Primary Cardiologist: Rozann Lesches, MD   Chief Complaint  Patient presents with  . Coronary Artery Disease    History of Present Illness: [name] is a 54 y.o. male last seen in September 2015. He presents for a follow-up visit, reports stable angina symptoms without progression. He has been working on his diet, decreasing carbohydrates and portion size, has lost about 20 pounds. He states he feels better, has more energy. His blood glucose has been very well controlled by report at home, and he has actually stopped taking Janumet. He asked about cutting back some of his other medications if possible.  He continues to exercise at the gym. Last ischemic testing was in 2014 as noted below.   Past Medical History  Diagnosis Date  . Coronary atherosclerosis of native coronary artery     Remote stent LAD, subsequent stents RCA with restenosis, DES to RCA and circ 8/08 in setting of restenosis, DES LAD and DES RCA 3/11  . Hyperlipidemia   . Essential hypertension, benign   . Hepatic steatosis   . Obstructive sleep apnea   . MI (myocardial infarction)     Anterior    Past Surgical History  Procedure Laterality Date  . Coronary angioplasty with stent placement      Current Outpatient Prescriptions  Medication Sig Dispense Refill  . aspirin 325 MG tablet Take 325 mg by mouth daily.      [name] esomeprazole (NEXIUM) 40 MG capsule Take 1 capsule (40 mg total) by mouth daily. 90 capsule 3  . fenofibrate (TRICOR) 48 MG tablet Take 1 tablet (48 mg total) by mouth daily. 90 tablet 3  . glipiZIDE (GLUCOTROL XL) 2.5 MG 24 hr tablet Take 2.5 mg by mouth daily with breakfast.    . isosorbide mononitrate (IMDUR) 30 MG 24 hr tablet Take 1 tablet (30 mg total) by mouth daily. 90 tablet 3  . metoprolol succinate (TOPROL-XL) 25 MG 24 hr tablet Take 3 tablets (75 mg  total) by mouth daily. Take 25mg  in morning and 50mg  in evening 270 tablet 1  . Multiple Vitamin (MULTIVITAMIN) tablet Take 1 tablet by mouth daily.      . nitroGLYCERIN (NITROSTAT) 0.4 MG SL tablet Place 1 tablet (0.4 mg total) under the tongue every 5 (five) minutes as needed. For chest pain 25 tablet 6  . Omega-3 Fatty Acids (FISH OIL) 1000 MG CAPS Take 2 capsules by mouth 2 (two) times daily.      . prasugrel (EFFIENT) 10 MG TABS tablet TAKE ONE TABLET BY MOUTH ONCE DAILY 90 tablet 3  . rosuvastatin (CRESTOR) 20 MG tablet Take 1 tablet (20 mg total) by mouth at bedtime. 90 tablet 3  . traMADol-acetaminophen (ULTRACET) 37.5-325 MG per tablet Take 1 tablet by mouth every 8 (eight) hours as needed.     . sitaGLIPtin-metformin (JANUMET) 50-500 MG per tablet Take 1 tablet by mouth daily.    . valsartan-hydrochlorothiazide (DIOVAN HCT) 160-12.5 MG per tablet Take 1 tablet by mouth daily. 90 tablet 3  . [DISCONTINUED] sodium chloride (OCEAN NASAL SPRAY) 0.65 % nasal spray Place 1 spray into the nose as needed for congestion. 30 mL 12   No current facility-administered medications for this visit.    Allergies:  Reopro   Social History: The patient  reports that he quit smoking about 14 years ago. His smoking use  included Cigarettes. He started smoking about 37 years ago. He has never used smokeless tobacco. He reports that he does not drink alcohol or use illicit drugs.   ROS:  Please see the history of present illness. Otherwise, complete review of systems is positive for none.  All other systems are reviewed and negative.    Physical Exam: VS:  BP 118/72 mmHg  Pulse 80  Ht 6\' 1"  (1.854 m)  Wt 218 lb 6.4 oz (99.066 kg)  BMI 28.82 kg/m2  SpO2 97%, BMI Body mass index is 28.82 kg/(m^2).  Wt Readings from Last 3 Encounters:  02/10/15 218 lb 6.4 oz (99.066 kg)  08/04/14 234 lb (106.142 kg)  01/21/14 241 lb (109.317 kg)     Comfortable at rest.  HEENT: Conjunctiva and lids normal,  oropharynx clear.  Neck: Supple, no elevated jugular venous pressure.  Lungs: Clear to auscultation, nonlabored.  Cardiac: Regular rate and rhythm, no S3 gallop, no loud systolic murmur. PMI indistinct.  Abdomen: Soft, nontender.  Extremities: No pitting edema, distal pulses one plus.  Skin: Warm and dry.  Musculoskeletal: No kyphosis.  Neuropsychiatric: Alert and oriented x3, affect appropriate.   ECG: ECG is not ordered today.  Other Studies Reviewed Today:  Lexiscan Myoview from July 2014 demonstrated no diagnostic ST segment abnormalities. There were fixed defects in the anteroseptal/anteroapical and inferior/inferoseptal walls consistent with scar. No active ischemia. LVEF 50%.  ASSESSMENT AND PLAN:  1. CAD status post multiple percutaneous interventions as outlined above, the last being in 2011. He has been symptomatically stable with exertional angina symptoms on medical therapy. Last ischemic workup was in 2014. Let us continue medical therapy and observation.  2. Essential hypertension, blood pressure very well controlled. We will try and cut his Diovan HCT dose back somewhat. Preferably, keep him on at least at low dose ARB with concurrent diabetes mellitus.  3. Type 2 diabetes mellitus, not taking Janumet at the present time. Keep follow-up with [facility].  4. Hyperlipidemia, on TriCor and Crestor.   Current medicines are reviewed at length with the patient today.     Disposition: FU with me in 6 months.   Signed, Satira Sark, MD, [facility] 02/10/2015 4:31 PM    [facility] at [facility] [address] [place], [place] [address] Phone: [phone]; Fax: [phone]

## 2015-04-27 ENCOUNTER — Telehealth: Payer: Self-pay | Admitting: Cardiology

## 2015-04-27 ENCOUNTER — Other Ambulatory Visit: Payer: Self-pay | Admitting: Cardiology

## 2015-04-27 MED ORDER — NITROGLYCERIN 0.4 MG SL SUBL: 0.4000 mg | SUBLINGUAL_TABLET | SUBLINGUAL | Status: DC | PRN

## 2015-04-27 NOTE — Telephone Encounter (Signed)
Needs new RX on NTG sent to [facility] / tg

## 2015-04-27 NOTE — Telephone Encounter (Signed)
Pt has lost his nitro and is wanting a RX faxed into [facility]

## 2015-09-14 ENCOUNTER — Encounter: Payer: Self-pay | Admitting: Cardiology

## 2015-09-14 ENCOUNTER — Ambulatory Visit (INDEPENDENT_AMBULATORY_CARE_PROVIDER_SITE_OTHER): Payer: BLUE CROSS/BLUE SHIELD | Admitting: Cardiology

## 2015-09-14 ENCOUNTER — Ambulatory Visit (HOSPITAL_COMMUNITY)
Admission: RE | Admit: 2015-09-14 | Discharge: 2015-09-14 | Disposition: A | Discharge status: Home / Self Care | Payer: BLUE CROSS/BLUE SHIELD | Source: Ambulatory Visit | Attending: Cardiology | Admitting: Cardiology

## 2015-09-14 VITALS — BP 130/68 | HR 80 | Ht 72.0 in | Wt 230.0 lb

## 2015-09-14 DIAGNOSIS — I200 Unstable angina: Secondary | ICD-10-CM

## 2015-09-14 DIAGNOSIS — E785 Hyperlipidemia, unspecified: Secondary | ICD-10-CM

## 2015-09-14 DIAGNOSIS — R079 Chest pain, unspecified: Secondary | ICD-10-CM | POA: Insufficient documentation

## 2015-09-14 DIAGNOSIS — I25119 Atherosclerotic heart disease of native coronary artery with unspecified angina pectoris: Secondary | ICD-10-CM | POA: Diagnosis not present

## 2015-09-14 DIAGNOSIS — Z01818 Encounter for other preprocedural examination: Secondary | ICD-10-CM

## 2015-09-14 DIAGNOSIS — I10 Essential (primary) hypertension: Secondary | ICD-10-CM

## 2015-09-14 MED ORDER — METOPROLOL SUCCINATE ER 25 MG PO TB24: 75.0000 mg | ORAL_TABLET | Freq: Every day | ORAL | Status: DC

## 2015-09-14 MED ORDER — ROSUVASTATIN CALCIUM 20 MG PO TABS: 20.0000 mg | ORAL_TABLET | Freq: Every day | ORAL | Status: DC

## 2015-09-14 NOTE — Addendum Note (Signed)
Addended by: Barbarann Ehlers A on: 09/14/2015 04:47 PM   Modules accepted: Orders, Medications

## 2015-09-14 NOTE — Progress Notes (Signed)
Cardiology Office Note  Date: 09/14/2015   ID: BLAIKE VICKERS, DOB 06-28-1961, MRN [id number]  PCP: Leonides Grills, MD  Primary Cardiologist: Rozann Lesches, MD   Chief Complaint  Patient presents with  . Coronary Artery Disease  . Progressing angina    History of Present Illness: [name] is a 54 y.o. male last seen in March. He presents for a routine cardiac visit today. Still works at a [employer]. 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 He has not been exercising as regularly, states that he gets 10,000 steps each day at work however.  He reports a definite increase in exertional angina despite consistence with medical therapy. He is needing more nitroglycerin. Also somewhat more short of breath with activity. He gets symptoms regularly at work at this point.  Last ischemic testing in 2014 is outlined below. Cardiac history includes stenting within all 3 major epicardial territories, most recently DES to the LAD and RCA and 2011. Fortunately, he has done fairly well on medical therapy since that time, but has a known history of restenosis.  ECG today shows sinus rhythm with Q in lead III, decreased anterior R wave progression which is old.  We reviewed his medications. For now we will increase Imdur to 60 mg daily. We discussed arranging follow-up cardiac ischemic evaluation. In light of his progressive symptoms and duration of time since prior coronary intervention in 2011, plan is to proceed with a diagnostic cardiac catheterization to best clarify his coronary anatomy and determine if he has any revascularization options to consider. We discussed the risks and benefits, and he is in agreement to proceed.  Past Medical History  Diagnosis Date  . Coronary atherosclerosis of native coronary artery     Remote stent LAD, subsequent stents RCA with restenosis, DES to RCA and circ 8/08 in setting of restenosis, DES LAD and DES RCA 3/11  .  Hyperlipidemia   . Essential hypertension, benign   . Hepatic steatosis   . Obstructive sleep apnea   . MI (myocardial infarction) [facility])     Anterior    Past Surgical History  Procedure Laterality Date  . Coronary angioplasty with stent placement      Current Outpatient Prescriptions  Medication Sig Dispense Refill  . aspirin 325 MG tablet Take 325 mg by mouth daily.      [name] esomeprazole (NEXIUM) 40 MG capsule Take 1 capsule (40 mg total) by mouth daily. 90 capsule 3  . fenofibrate (TRICOR) 48 MG tablet Take 1 tablet (48 mg total) by mouth daily. 90 tablet 3  . glipiZIDE (GLUCOTROL XL) 2.5 MG 24 hr tablet Take 2.5 mg by mouth daily with breakfast.    . isosorbide mononitrate (IMDUR) 30 MG 24 hr tablet Take 1 tablet (30 mg total) by mouth daily. 90 tablet 3  . metoprolol succinate (TOPROL-XL) 25 MG 24 hr tablet Take 3 tablets (75 mg total) by mouth daily. Take 25mg  in morning and 50mg  in evening 270 tablet 1  . Multiple Vitamin (MULTIVITAMIN) tablet Take 1 tablet by mouth daily.      . nitroGLYCERIN (NITROSTAT) 0.4 MG SL tablet Place 1 tablet (0.4 mg total) under the tongue every 5 (five) minutes as needed. For chest pain 25 tablet 6  . Omega-3 Fatty Acids (FISH OIL) 1000 MG CAPS Take 2 capsules by mouth 2 (two) times daily.      . prasugrel (EFFIENT) 10 MG TABS tablet TAKE ONE TABLET BY MOUTH ONCE DAILY 90  tablet 3  . rosuvastatin (CRESTOR) 20 MG tablet Take 1 tablet (20 mg total) by mouth at bedtime. 90 tablet 3  . sitaGLIPtin-metformin (JANUMET) 50-500 MG per tablet Take 1 tablet by mouth daily.    . traMADol-acetaminophen (ULTRACET) 37.5-325 MG per tablet Take 1 tablet by mouth every 8 (eight) hours as needed.     . valsartan-hydrochlorothiazide (DIOVAN HCT) 160-12.5 MG per tablet Take 1 tablet by mouth daily. 90 tablet 3  . [DISCONTINUED] sodium chloride (OCEAN NASAL SPRAY) 0.65 % nasal spray Place 1 spray into the nose as needed for congestion. 30 mL 12   No current  facility-administered medications for this visit.    Allergies:  Reopro   Social History: The patient  reports that he quit smoking about 15 years ago. His smoking use included Cigarettes. He started smoking about 37 years ago. He has never used smokeless tobacco. He reports that he does not drink alcohol or use illicit drugs.   ROS:  Please see the history of present illness. Otherwise, complete review of systems is positive for none.  All other systems are reviewed and negative.   Physical Exam: VS:  BP 130/68 mmHg  Pulse 80  Ht 6' (1.829 m)  Wt 230 lb (104.327 kg)  BMI 31.19 kg/m2  SpO2 97%, BMI Body mass index is 31.19 kg/(m^2).  Wt Readings from Last 3 Encounters:  09/14/15 230 lb (104.327 kg)  02/10/15 218 lb 6.4 oz (99.066 kg)  08/04/14 234 lb (106.142 kg)     Comfortable at rest.  HEENT: Conjunctiva and lids normal, oropharynx clear.  Neck: Supple, no elevated jugular venous pressure.  Lungs: Clear to auscultation, nonlabored.  Cardiac: Regular rate and rhythm, no S3 gallop, no loud systolic murmur. PMI indistinct.  Abdomen: Soft, nontender.  Extremities: No pitting edema, distal pulses one plus.  Skin: Warm and dry.  Musculoskeletal: No kyphosis.  Neuropsychiatric: Alert and oriented x3, affect appropriate.   ECG: ECG is ordered today.  Other Studies Reviewed Today:  Lexiscan Myoview from July 2014 demonstrated no diagnostic ST segment abnormalities. There were fixed defects in the anteroseptal/anteroapical and inferior/inferoseptal walls consistent with scar. No active ischemia. LVEF 50%.  ASSESSMENT AND PLAN:  1. Accelerating angina on medical therapy. History of multivessel CAD status post previous percutaneous coronary interventions as outlined above. Most recently underwent DES to the LAD and RCA in March 2011. ECG shows no acute changes. Plan is to proceed with a diagnostic cardiac catheterization for clarification of coronary anatomy and  determination of any revascularization options. He is in agreement to proceed. Increase Imdur to 60 mg daily, at least temporarily.  2. Essential hypertension, continue current antihypertensives.  3. Hyperlipidemia, on Crestor.  Current medicines were reviewed at length with the patient today.   Orders Placed This Encounter  Procedures  . EKG 12-Lead    Disposition: FU with me in 1 month.   Signed, Satira Sark, MD, [facility] 09/14/2015 4:30 PM    [place] at [facility] [address], [place], [place] [address] Phone: [phone]; Fax: [phone]

## 2015-09-14 NOTE — Addendum Note (Signed)
Addended by: Barbarann Ehlers A on: 09/14/2015 04:52 PM   Modules accepted: Orders, Medications

## 2015-09-14 NOTE — Patient Instructions (Addendum)
Your physician has requested that you have a cardiac catheterization. Cardiac catheterization is used to diagnose and/or treat various heart conditions. Doctors may recommend this procedure for a number of different reasons. The most common reason is to evaluate chest pain. Chest pain can be a symptom of coronary artery disease (CAD), and cardiac catheterization can show whether plaque is narrowing or blocking your heart's arteries. This procedure is also used to evaluate the valves, as well as measure the blood flow and oxygen levels in different parts of your heart. For further information please visit [email or web address]. Please follow instruction sheet, as given.   For now,take your Imdur 30 mg twice a day   Thank you for choosing [facility] !

## 2015-09-17 ENCOUNTER — Ambulatory Visit (HOSPITAL_COMMUNITY)
Admission: RE | Admit: 2015-09-17 | Discharge: 2015-09-18 | Disposition: A | Discharge status: Home / Self Care | Payer: BLUE CROSS/BLUE SHIELD | Source: Ambulatory Visit | Attending: Cardiovascular Disease | Admitting: Cardiovascular Disease

## 2015-09-17 ENCOUNTER — Encounter (HOSPITAL_COMMUNITY)
Admission: RE | Disposition: A | Discharge status: Home / Self Care | Payer: BLUE CROSS/BLUE SHIELD | Source: Ambulatory Visit | Attending: Cardiovascular Disease

## 2015-09-17 DIAGNOSIS — I25118 Atherosclerotic heart disease of native coronary artery with other forms of angina pectoris: Secondary | ICD-10-CM

## 2015-09-17 DIAGNOSIS — I252 Old myocardial infarction: Secondary | ICD-10-CM | POA: Diagnosis not present

## 2015-09-17 DIAGNOSIS — E119 Type 2 diabetes mellitus without complications: Secondary | ICD-10-CM | POA: Diagnosis not present

## 2015-09-17 DIAGNOSIS — I2510 Atherosclerotic heart disease of native coronary artery without angina pectoris: Secondary | ICD-10-CM

## 2015-09-17 DIAGNOSIS — Z7982 Long term (current) use of aspirin: Secondary | ICD-10-CM | POA: Insufficient documentation

## 2015-09-17 DIAGNOSIS — I2089 Other forms of angina pectoris: Secondary | ICD-10-CM | POA: Diagnosis present

## 2015-09-17 DIAGNOSIS — E785 Hyperlipidemia, unspecified: Secondary | ICD-10-CM

## 2015-09-17 DIAGNOSIS — D696 Thrombocytopenia, unspecified: Secondary | ICD-10-CM | POA: Diagnosis not present

## 2015-09-17 DIAGNOSIS — I208 Other forms of angina pectoris: Secondary | ICD-10-CM | POA: Diagnosis present

## 2015-09-17 DIAGNOSIS — I209 Angina pectoris, unspecified: Secondary | ICD-10-CM | POA: Diagnosis present

## 2015-09-17 DIAGNOSIS — I200 Unstable angina: Secondary | ICD-10-CM

## 2015-09-17 DIAGNOSIS — G4733 Obstructive sleep apnea (adult) (pediatric): Secondary | ICD-10-CM | POA: Insufficient documentation

## 2015-09-17 DIAGNOSIS — I2582 Chronic total occlusion of coronary artery: Secondary | ICD-10-CM | POA: Diagnosis not present

## 2015-09-17 DIAGNOSIS — I255 Ischemic cardiomyopathy: Secondary | ICD-10-CM | POA: Diagnosis not present

## 2015-09-17 DIAGNOSIS — Z9861 Coronary angioplasty status: Secondary | ICD-10-CM

## 2015-09-17 DIAGNOSIS — Z955 Presence of coronary angioplasty implant and graft: Secondary | ICD-10-CM | POA: Diagnosis not present

## 2015-09-17 DIAGNOSIS — Z87891 Personal history of nicotine dependence: Secondary | ICD-10-CM | POA: Insufficient documentation

## 2015-09-17 DIAGNOSIS — I10 Essential (primary) hypertension: Secondary | ICD-10-CM | POA: Diagnosis not present

## 2015-09-17 DIAGNOSIS — Y838 Other surgical procedures as the cause of abnormal reaction of the patient, or of later complication, without mention of misadventure at the time of the procedure: Secondary | ICD-10-CM | POA: Diagnosis not present

## 2015-09-17 DIAGNOSIS — Z7984 Long term (current) use of oral hypoglycemic drugs: Secondary | ICD-10-CM | POA: Insufficient documentation

## 2015-09-17 DIAGNOSIS — T82855A Stenosis of coronary artery stent, initial encounter: Secondary | ICD-10-CM | POA: Insufficient documentation

## 2015-09-17 HISTORY — DX: Thrombocytopenia, unspecified: D69.6

## 2015-09-17 HISTORY — DX: Ischemic cardiomyopathy: I25.5

## 2015-09-17 HISTORY — DX: Essential (primary) hypertension: I10

## 2015-09-17 HISTORY — DX: Type 2 diabetes mellitus without complications: E11.9

## 2015-09-17 HISTORY — PX: CARDIAC CATHETERIZATION: SHX172

## 2015-09-17 LAB — CBC
HCT: 46.5 % (ref 39.0–52.0)
Hemoglobin: 16.4 g/dL (ref 13.0–17.0)
MCH: 31 pg (ref 26.0–34.0)
MCHC: 35.3 g/dL (ref 30.0–36.0)
MCV: 87.9 fL (ref 78.0–100.0)
Platelets: 141 10*3/uL — ABNORMAL LOW (ref 150–400)
RBC: 5.29 MIL/uL (ref 4.22–5.81)
RDW: 12.9 % (ref 11.5–15.5)
WBC: 5.7 10*3/uL (ref 4.0–10.5)

## 2015-09-17 LAB — PROTIME-INR
INR: 1.17 (ref 0.00–1.49)
Prothrombin Time: 15.1 seconds (ref 11.6–15.2)

## 2015-09-17 LAB — GLUCOSE, CAPILLARY
Glucose-Capillary: 126 mg/dL — ABNORMAL HIGH (ref 65–99)
Glucose-Capillary: 111 mg/dL — ABNORMAL HIGH (ref 65–99)
Glucose-Capillary: 230 mg/dL — ABNORMAL HIGH (ref 65–99)
Glucose-Capillary: 141 mg/dL — ABNORMAL HIGH (ref 65–99)

## 2015-09-17 LAB — BASIC METABOLIC PANEL
Anion gap: 11 (ref 5–15)
BUN: 21 mg/dL — ABNORMAL HIGH (ref 6–20)
CO2: 26 mmol/L (ref 22–32)
Calcium: 9.6 mg/dL (ref 8.9–10.3)
Chloride: 103 mmol/L (ref 101–111)
Creatinine, Ser: 0.85 mg/dL (ref 0.61–1.24)
GFR calc Af Amer: >60 mL/min (ref >60)
GFR calc non Af Amer: >60 mL/min (ref >60)
Glucose, Bld: 124 mg/dL — ABNORMAL HIGH (ref 65–99)
Potassium: 4 mmol/L (ref 3.5–5.1)
Sodium: 140 mmol/L (ref 135–145)

## 2015-09-17 LAB — POCT ACTIVATED CLOTTING TIME
Activated Clotting Time: 257 seconds
Activated Clotting Time: 276 seconds

## 2015-09-17 SURGERY — LEFT HEART CATH AND CORONARY ANGIOGRAPHY

## 2015-09-17 MED ORDER — MORPHINE SULFATE (PF) 2 MG/ML IV SOLN
INTRAVENOUS | Status: AC
  Filled 2015-09-17: qty 1

## 2015-09-17 MED ORDER — LIDOCAINE HCL (PF) 1 % IJ SOLN
INTRAMUSCULAR | Status: AC
  Filled 2015-09-17: qty 30

## 2015-09-17 MED ORDER — IRBESARTAN 300 MG PO TABS
300.0000 mg | ORAL_TABLET | Freq: Every day | ORAL | Status: DC
  Administered 2015-09-18: 10:00:00 300 mg via ORAL
  Filled 2015-09-17: qty 1

## 2015-09-17 MED ORDER — PRASUGREL HCL 10 MG PO TABS
10.0000 mg | ORAL_TABLET | Freq: Every day | ORAL | Status: DC
  Administered 2015-09-18: 10 mg via ORAL
  Filled 2015-09-17: qty 1

## 2015-09-17 MED ORDER — SODIUM CHLORIDE 0.9 % IV SOLN
INTRAVENOUS | Status: DC
  Administered 2015-09-17: 250 mL via INTRAVENOUS
  Administered 2015-09-17: 10:00:00 via INTRAVENOUS

## 2015-09-17 MED ORDER — SODIUM CHLORIDE 0.9 % IV SOLN: INTRAVENOUS | Status: AC

## 2015-09-17 MED ORDER — HEPARIN (PORCINE) IN NACL 2-0.9 UNIT/ML-% IJ SOLN
INTRAMUSCULAR | Status: AC
  Filled 2015-09-17: qty 1500

## 2015-09-17 MED ORDER — FENTANYL CITRATE (PF) 100 MCG/2ML IJ SOLN
INTRAMUSCULAR | Status: DC | PRN
  Administered 2015-09-17 (×3): 50 ug via INTRAVENOUS

## 2015-09-17 MED ORDER — MIDAZOLAM HCL 2 MG/2ML IJ SOLN
INTRAMUSCULAR | Status: DC | PRN
  Administered 2015-09-17 (×2): 2 mg via INTRAVENOUS
  Administered 2015-09-17: 1 mg via INTRAVENOUS

## 2015-09-17 MED ORDER — LIDOCAINE HCL (PF) 1 % IJ SOLN
INTRAMUSCULAR | Status: DC | PRN
  Administered 2015-09-17: 2 mL via INTRADERMAL

## 2015-09-17 MED ORDER — TRAMADOL-ACETAMINOPHEN 37.5-325 MG PO TABS
1.0000 | ORAL_TABLET | Freq: Three times a day (TID) | ORAL | Status: DC | PRN
  Administered 2015-09-18: 07:00:00 1 via ORAL
  Filled 2015-09-17 (×2): qty 1

## 2015-09-17 MED ORDER — MORPHINE SULFATE (PF) 4 MG/ML IV SOLN
INTRAVENOUS | Status: AC
  Filled 2015-09-17: qty 1

## 2015-09-17 MED ORDER — ANGIOPLASTY BOOK
Freq: Once | Status: AC
  Administered 2015-09-17: 20:00:00
  Filled 2015-09-17: qty 1

## 2015-09-17 MED ORDER — LIVING WELL WITH DIABETES BOOK
Freq: Once | Status: AC
  Administered 2015-09-17: 20:00:00
  Filled 2015-09-17: qty 1

## 2015-09-17 MED ORDER — METOPROLOL SUCCINATE ER 50 MG PO TB24: 75.0000 mg | ORAL_TABLET | Freq: Every day | ORAL | Status: DC

## 2015-09-17 MED ORDER — SODIUM CHLORIDE 0.9 % IV SOLN: 250.0000 mL | INTRAVENOUS | Status: DC | PRN

## 2015-09-17 MED ORDER — ASPIRIN 81 MG PO CHEW
81.0000 mg | CHEWABLE_TABLET | Freq: Every day | ORAL | Status: DC
  Administered 2015-09-18: 10:00:00 81 mg via ORAL
  Filled 2015-09-17: qty 1

## 2015-09-17 MED ORDER — SODIUM CHLORIDE 0.9 % IJ SOLN: 3.0000 mL | Freq: Two times a day (BID) | INTRAMUSCULAR | Status: DC

## 2015-09-17 MED ORDER — ASPIRIN 81 MG PO CHEW: 81.0000 mg | CHEWABLE_TABLET | ORAL | Status: DC

## 2015-09-17 MED ORDER — VERAPAMIL HCL 2.5 MG/ML IV SOLN
INTRAVENOUS | Status: AC
  Filled 2015-09-17: qty 2

## 2015-09-17 MED ORDER — HYDROMORPHONE HCL 1 MG/ML IJ SOLN
1.0000 mg | Freq: Once | INTRAMUSCULAR | Status: AC
  Administered 2015-09-17: 1 mg via INTRAVENOUS

## 2015-09-17 MED ORDER — MIDAZOLAM HCL 2 MG/2ML IJ SOLN
INTRAMUSCULAR | Status: AC
  Filled 2015-09-17: qty 4

## 2015-09-17 MED ORDER — PANTOPRAZOLE SODIUM 40 MG PO TBEC
40.0000 mg | DELAYED_RELEASE_TABLET | Freq: Every day | ORAL | Status: DC
  Administered 2015-09-18: 10:00:00 40 mg via ORAL
  Filled 2015-09-17: qty 1

## 2015-09-17 MED ORDER — HEPARIN SODIUM (PORCINE) 1000 UNIT/ML IJ SOLN
INTRAMUSCULAR | Status: DC | PRN
  Administered 2015-09-17: 3000 [IU] via INTRAVENOUS
  Administered 2015-09-17 (×2): 5000 [IU] via INTRAVENOUS

## 2015-09-17 MED ORDER — SODIUM CHLORIDE 0.9 % IJ SOLN
3.0000 mL | Freq: Two times a day (BID) | INTRAMUSCULAR | Status: DC
  Administered 2015-09-17: 3 mL via INTRAVENOUS

## 2015-09-17 MED ORDER — INSULIN ASPART 100 UNIT/ML ~~LOC~~ SOLN
0.0000 [IU] | Freq: Three times a day (TID) | SUBCUTANEOUS | Status: DC
  Administered 2015-09-17: 19:00:00 5 [IU] via SUBCUTANEOUS
  Administered 2015-09-18: 2 [IU] via SUBCUTANEOUS

## 2015-09-17 MED ORDER — FENOFIBRATE 54 MG PO TABS
54.0000 mg | ORAL_TABLET | Freq: Every day | ORAL | Status: DC
  Administered 2015-09-18: 54 mg via ORAL
  Filled 2015-09-17: qty 1

## 2015-09-17 MED ORDER — SODIUM CHLORIDE 0.9 % IJ SOLN: 3.0000 mL | INTRAMUSCULAR | Status: DC | PRN

## 2015-09-17 MED ORDER — HYDROCHLOROTHIAZIDE 25 MG PO TABS
25.0000 mg | ORAL_TABLET | Freq: Every day | ORAL | Status: DC
  Administered 2015-09-18: 10:00:00 25 mg via ORAL
  Filled 2015-09-17: qty 1

## 2015-09-17 MED ORDER — HEPARIN SODIUM (PORCINE) 1000 UNIT/ML IJ SOLN
INTRAMUSCULAR | Status: AC
  Filled 2015-09-17: qty 1

## 2015-09-17 MED ORDER — VALSARTAN-HYDROCHLOROTHIAZIDE 320-25 MG PO TABS: 1.0000 | ORAL_TABLET | Freq: Every day | ORAL | Status: DC

## 2015-09-17 MED ORDER — ROSUVASTATIN CALCIUM 20 MG PO TABS
20.0000 mg | ORAL_TABLET | Freq: Every day | ORAL | Status: DC
  Administered 2015-09-17: 20 mg via ORAL
  Filled 2015-09-17: qty 1

## 2015-09-17 MED ORDER — ACETAMINOPHEN 325 MG PO TABS
650.0000 mg | ORAL_TABLET | ORAL | Status: DC | PRN
  Administered 2015-09-17 – 2015-09-18 (×2): 650 mg via ORAL
  Filled 2015-09-17 (×2): qty 2

## 2015-09-17 MED ORDER — FENTANYL CITRATE (PF) 100 MCG/2ML IJ SOLN
INTRAMUSCULAR | Status: AC
  Filled 2015-09-17: qty 4

## 2015-09-17 MED ORDER — ISOSORBIDE MONONITRATE ER 30 MG PO TB24: 30.0000 mg | ORAL_TABLET | Freq: Every day | ORAL | Status: DC

## 2015-09-17 MED ORDER — NITROGLYCERIN 1 MG/10 ML FOR IR/CATH LAB
INTRA_ARTERIAL | Status: DC | PRN
  Administered 2015-09-17: 150 ug via INTRACORONARY
  Administered 2015-09-17: 200 ug
  Administered 2015-09-17: 150 ug

## 2015-09-17 MED ORDER — VERAPAMIL HCL 2.5 MG/ML IV SOLN
INTRAVENOUS | Status: DC | PRN
  Administered 2015-09-17: 12:00:00 via INTRA_ARTERIAL

## 2015-09-17 MED ORDER — ONDANSETRON HCL 4 MG/2ML IJ SOLN: 4.0000 mg | Freq: Four times a day (QID) | INTRAMUSCULAR | Status: DC | PRN

## 2015-09-17 MED ORDER — ADULT MULTIVITAMIN W/MINERALS CH
1.0000 | ORAL_TABLET | Freq: Every day | ORAL | Status: DC
  Administered 2015-09-18: 1 via ORAL
  Filled 2015-09-17: qty 1

## 2015-09-17 MED ORDER — HYDROMORPHONE HCL 1 MG/ML IJ SOLN
INTRAMUSCULAR | Status: AC
  Filled 2015-09-17: qty 1

## 2015-09-17 MED ORDER — MORPHINE SULFATE (PF) 4 MG/ML IV SOLN
4.0000 mg | Freq: Once | INTRAVENOUS | Status: AC
  Administered 2015-09-17 (×2): 2 mg via INTRAVENOUS

## 2015-09-17 SURGICAL SUPPLY — 21 items
BALLN EMERGE MR 2.5X12 (BALLOONS) ×2
BALLN ~~LOC~~ EUPHORA RX 3.0X12 (BALLOONS) ×2
BALLN ~~LOC~~ TREK RX 4.0X20 (BALLOONS) ×2 IMPLANT
BALLOON EMERGE MR 2.5X12 (BALLOONS) ×1 IMPLANT
BALLOON ~~LOC~~ EUPHORA RX 3.0X12 (BALLOONS) ×1 IMPLANT
CATH INFINITI 5 FR JL3.5 (CATHETERS) ×2 IMPLANT
CATH INFINITI 5FR ANG PIGTAIL (CATHETERS) ×2 IMPLANT
CATH INFINITI JR4 5F (CATHETERS) ×2 IMPLANT
CATH VISTA GUIDE 6FR XBLAD3.5 (CATHETERS) ×2 IMPLANT
DEVICE RAD COMP TR BAND LRG (VASCULAR PRODUCTS) ×2 IMPLANT
GLIDESHEATH SLEND SS 6F .021 (SHEATH) ×2 IMPLANT
KIT ENCORE 26 ADVANTAGE (KITS) ×2 IMPLANT
KIT HEART LEFT (KITS) ×2 IMPLANT
PACK CARDIAC CATHETERIZATION (CUSTOM PROCEDURE TRAY) ×2 IMPLANT
STENT PROMUS PREM MR 2.75X16 (Permanent Stent) ×2 IMPLANT
STENT PROMUS PREM MR 3.5X24 (Permanent Stent) ×2 IMPLANT
SYR MEDRAD MARK V 150ML (SYRINGE) ×2 IMPLANT
TRANSDUCER W/STOPCOCK (MISCELLANEOUS) ×2 IMPLANT
TUBING CIL FLEX 10 FLL-RA (TUBING) ×2 IMPLANT
WIRE COUGAR XT STRL 190CM (WIRE) ×2 IMPLANT
WIRE SAFE-T 1.5MM-J .035X260CM (WIRE) ×2 IMPLANT

## 2015-09-17 NOTE — Interval H&P Note (Signed)
History and Physical Interval Note:  09/17/2015 11:53 AM  [name]  has presented today for surgery, with the diagnosis of angina  The various methods of treatment have been discussed with the patient and family. After consideration of risks, benefits and other options for treatment, the patient has consented to  Procedure(s): Left Heart Cath and Coronary Angiography (N/A) as a surgical intervention .  The patient's history has been reviewed, patient examined, no change in status, stable for surgery.  I have reviewed the patient's chart and labs.  Questions were answered to the patient's satisfaction.    Cath Lab Visit (complete for each Cath Lab visit)  Clinical Evaluation Leading to the Procedure:   ACS: No.  Non-ACS:    Anginal Classification: CCS III  Anti-ischemic medical therapy: Maximal Therapy (2 or more classes of medications)  Non-Invasive Test Results: No non-invasive testing performed  Prior CABG: No previous CABG       [name]

## 2015-09-17 NOTE — H&P (View-Only) (Signed)
Cardiology Office Note  Date: 09/14/2015   ID: GERMAINE [name], DOB [birth year], MRN [id number]  PCP: Leonides Grills, MD  Primary Cardiologist: Rozann Lesches, MD   Chief Complaint  Patient presents with  . Coronary Artery Disease  . Progressing angina    History of Present Illness: [name] is a 54 y.o. male last seen in March. He presents for a routine cardiac visit today. Still works at a [employer]. 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 He has not been exercising as regularly, states that he gets 10,000 steps each day at work however.  He reports a definite increase in exertional angina despite consistence with medical therapy. He is needing more nitroglycerin. Also somewhat more short of breath with activity. He gets symptoms regularly at work at this point.  Last ischemic testing in 2014 is outlined below. Cardiac history includes stenting within all 3 major epicardial territories, most recently DES to the LAD and RCA and 2011. Fortunately, he has done fairly well on medical therapy since that time, but has a known history of restenosis.  ECG today shows sinus rhythm with Q in lead III, decreased anterior R wave progression which is old.  We reviewed his medications. For now we will increase Imdur to 60 mg daily. We discussed arranging follow-up cardiac ischemic evaluation. In light of his progressive symptoms and duration of time since prior coronary intervention in 2011, plan is to proceed with a diagnostic cardiac catheterization to best clarify his coronary anatomy and determine if he has any revascularization options to consider. We discussed the risks and benefits, and he is in agreement to proceed.  Past Medical History  Diagnosis Date  . Coronary atherosclerosis of native coronary artery     Remote stent LAD, subsequent stents RCA with restenosis, DES to RCA and circ 8/08 in setting of restenosis, DES LAD and DES RCA 3/11  .  Hyperlipidemia   . Essential hypertension, benign   . Hepatic steatosis   . Obstructive sleep apnea   . MI (myocardial infarction) [facility])     Anterior    Past Surgical History  Procedure Laterality Date  . Coronary angioplasty with stent placement      Current Outpatient Prescriptions  Medication Sig Dispense Refill  . aspirin 325 MG tablet Take 325 mg by mouth daily.      [name] esomeprazole (NEXIUM) 40 MG capsule Take 1 capsule (40 mg total) by mouth daily. 90 capsule 3  . fenofibrate (TRICOR) 48 MG tablet Take 1 tablet (48 mg total) by mouth daily. 90 tablet 3  . glipiZIDE (GLUCOTROL XL) 2.5 MG 24 hr tablet Take 2.5 mg by mouth daily with breakfast.    . isosorbide mononitrate (IMDUR) 30 MG 24 hr tablet Take 1 tablet (30 mg total) by mouth daily. 90 tablet 3  . metoprolol succinate (TOPROL-XL) 25 MG 24 hr tablet Take 3 tablets (75 mg total) by mouth daily. Take 25mg  in morning and 50mg  in evening 270 tablet 1  . Multiple Vitamin (MULTIVITAMIN) tablet Take 1 tablet by mouth daily.      . nitroGLYCERIN (NITROSTAT) 0.4 MG SL tablet Place 1 tablet (0.4 mg total) under the tongue every 5 (five) minutes as needed. For chest pain 25 tablet 6  . Omega-3 Fatty Acids (FISH OIL) 1000 MG CAPS Take 2 capsules by mouth 2 (two) times daily.      . prasugrel (EFFIENT) 10 MG TABS tablet TAKE ONE TABLET BY MOUTH ONCE DAILY 90  tablet 3  . rosuvastatin (CRESTOR) 20 MG tablet Take 1 tablet (20 mg total) by mouth at bedtime. 90 tablet 3  . sitaGLIPtin-metformin (JANUMET) 50-500 MG per tablet Take 1 tablet by mouth daily.    . traMADol-acetaminophen (ULTRACET) 37.5-325 MG per tablet Take 1 tablet by mouth every 8 (eight) hours as needed.     . valsartan-hydrochlorothiazide (DIOVAN HCT) 160-12.5 MG per tablet Take 1 tablet by mouth daily. 90 tablet 3  . [DISCONTINUED] sodium chloride (OCEAN NASAL SPRAY) 0.65 % nasal spray Place 1 spray into the nose as needed for congestion. 30 mL 12   No current  facility-administered medications for this visit.    Allergies:  Reopro   Social History: The patient  reports that he quit smoking about 15 years ago. His smoking use included Cigarettes. He started smoking about 37 years ago. He has never used smokeless tobacco. He reports that he does not drink alcohol or use illicit drugs.   ROS:  Please see the history of present illness. Otherwise, complete review of systems is positive for none.  All other systems are reviewed and negative.   Physical Exam: VS:  BP 130/68 mmHg  Pulse 80  Ht 6' (1.829 m)  Wt 230 lb (104.327 kg)  BMI 31.19 kg/m2  SpO2 97%, BMI Body mass index is 31.19 kg/(m^2).  Wt Readings from Last 3 Encounters:  09/14/15 230 lb (104.327 kg)  02/10/15 218 lb 6.4 oz (99.066 kg)  08/04/14 234 lb (106.142 kg)     Comfortable at rest.  HEENT: Conjunctiva and lids normal, oropharynx clear.  Neck: Supple, no elevated jugular venous pressure.  Lungs: Clear to auscultation, nonlabored.  Cardiac: Regular rate and rhythm, no S3 gallop, no loud systolic murmur. PMI indistinct.  Abdomen: Soft, nontender.  Extremities: No pitting edema, distal pulses one plus.  Skin: Warm and dry.  Musculoskeletal: No kyphosis.  Neuropsychiatric: Alert and oriented x3, affect appropriate.   ECG: ECG is ordered today.  Other Studies Reviewed Today:  Lexiscan Myoview from July 2014 demonstrated no diagnostic ST segment abnormalities. There were fixed defects in the anteroseptal/anteroapical and inferior/inferoseptal walls consistent with scar. No active ischemia. LVEF 50%.  ASSESSMENT AND PLAN:  1. Accelerating angina on medical therapy. History of multivessel CAD status post previous percutaneous coronary interventions as outlined above. Most recently underwent DES to the LAD and RCA in March 2011. ECG shows no acute changes. Plan is to proceed with a diagnostic cardiac catheterization for clarification of coronary anatomy and  determination of any revascularization options. He is in agreement to proceed. Increase Imdur to 60 mg daily, at least temporarily.  2. Essential hypertension, continue current antihypertensives.  3. Hyperlipidemia, on Crestor.  Current medicines were reviewed at length with the patient today.   Orders Placed This Encounter  Procedures  . EKG 12-Lead    Disposition: FU with me in 1 month.   Signed, Satira Sark, MD, [facility] 09/14/2015 4:30 PM    [place] at [facility] [address], [place], [place] [address] Phone: [phone]; Fax: [phone]

## 2015-09-17 NOTE — Progress Notes (Signed)
TR BAND REMOVAL  LOCATION:    right radial  DEFLATED PER PROTOCOL:    Yes.    TIME BAND OFF / DRESSING APPLIED:    1730   SITE UPON ARRIVAL:    Level 0  SITE AFTER BAND REMOVAL:    Level 0  REVERSE ALLEN'S TEST:     positive  CIRCULATION SENSATION AND MOVEMENT:    Within Normal Limits   Yes.    COMMENTS:   Tolerated procedure well 

## 2015-09-18 ENCOUNTER — Encounter (HOSPITAL_COMMUNITY): Payer: Self-pay | Admitting: *Deleted

## 2015-09-18 DIAGNOSIS — Z9861 Coronary angioplasty status: Secondary | ICD-10-CM | POA: Diagnosis not present

## 2015-09-18 DIAGNOSIS — I2510 Atherosclerotic heart disease of native coronary artery without angina pectoris: Secondary | ICD-10-CM

## 2015-09-18 DIAGNOSIS — T82855A Stenosis of coronary artery stent, initial encounter: Secondary | ICD-10-CM | POA: Diagnosis not present

## 2015-09-18 DIAGNOSIS — E119 Type 2 diabetes mellitus without complications: Secondary | ICD-10-CM

## 2015-09-18 DIAGNOSIS — D696 Thrombocytopenia, unspecified: Secondary | ICD-10-CM

## 2015-09-18 DIAGNOSIS — I255 Ischemic cardiomyopathy: Secondary | ICD-10-CM | POA: Diagnosis present

## 2015-09-18 DIAGNOSIS — I25118 Atherosclerotic heart disease of native coronary artery with other forms of angina pectoris: Secondary | ICD-10-CM | POA: Diagnosis not present

## 2015-09-18 DIAGNOSIS — E785 Hyperlipidemia, unspecified: Secondary | ICD-10-CM

## 2015-09-18 DIAGNOSIS — I2582 Chronic total occlusion of coronary artery: Secondary | ICD-10-CM | POA: Diagnosis not present

## 2015-09-18 DIAGNOSIS — Z955 Presence of coronary angioplasty implant and graft: Secondary | ICD-10-CM | POA: Diagnosis not present

## 2015-09-18 LAB — BASIC METABOLIC PANEL
Anion gap: 11 (ref 5–15)
BUN: 13 mg/dL (ref 6–20)
CO2: 24 mmol/L (ref 22–32)
Calcium: 9.5 mg/dL (ref 8.9–10.3)
Chloride: 101 mmol/L (ref 101–111)
Creatinine, Ser: 0.85 mg/dL (ref 0.61–1.24)
GFR calc Af Amer: >60 mL/min (ref >60)
GFR calc non Af Amer: >60 mL/min (ref >60)
Glucose, Bld: 135 mg/dL — ABNORMAL HIGH (ref 65–99)
Potassium: 4 mmol/L (ref 3.5–5.1)
Sodium: 136 mmol/L (ref 135–145)

## 2015-09-18 LAB — CBC
HCT: 44.1 % (ref 39.0–52.0)
Hemoglobin: 15.1 g/dL (ref 13.0–17.0)
MCH: 30 pg (ref 26.0–34.0)
MCHC: 34.2 g/dL (ref 30.0–36.0)
MCV: 87.7 fL (ref 78.0–100.0)
Platelets: 127 10*3/uL — ABNORMAL LOW (ref 150–400)
RBC: 5.03 MIL/uL (ref 4.22–5.81)
RDW: 12.9 % (ref 11.5–15.5)
WBC: 6.7 10*3/uL (ref 4.0–10.5)

## 2015-09-18 LAB — GLUCOSE, CAPILLARY: Glucose-Capillary: 146 mg/dL — ABNORMAL HIGH (ref 65–99)

## 2015-09-18 MED ORDER — NITROGLYCERIN 0.4 MG SL SUBL
0.4000 mg | SUBLINGUAL_TABLET | SUBLINGUAL | Status: DC | PRN
  Administered 2015-09-18: 04:00:00 0.4 mg via SUBLINGUAL

## 2015-09-18 MED ORDER — ISOSORBIDE MONONITRATE ER 60 MG PO TB24: 60.0000 mg | ORAL_TABLET | Freq: Every day | ORAL | Status: DC

## 2015-09-18 MED ORDER — LIVING BETTER WITH HEART FAILURE BOOK
Freq: Once | Status: AC
  Administered 2015-09-18: 11:00:00

## 2015-09-18 MED ORDER — METOPROLOL SUCCINATE ER 50 MG PO TB24: 75.0000 mg | ORAL_TABLET | Freq: Every day | ORAL | Status: DC

## 2015-09-18 MED ORDER — METOPROLOL SUCCINATE ER 50 MG PO TB24
75.0000 mg | ORAL_TABLET | Freq: Every day | ORAL | Status: DC
  Administered 2015-09-18: 75 mg via ORAL
  Filled 2015-09-18 (×2): qty 1

## 2015-09-18 MED ORDER — ISOSORBIDE MONONITRATE ER 30 MG PO TB24
30.0000 mg | ORAL_TABLET | Freq: Every day | ORAL | Status: DC
  Administered 2015-09-18: 10:00:00 30 mg via ORAL
  Filled 2015-09-18: qty 1

## 2015-09-18 MED ORDER — NITROGLYCERIN 0.4 MG SL SUBL
SUBLINGUAL_TABLET | SUBLINGUAL | Status: AC
  Filled 2015-09-18: qty 1

## 2015-09-18 MED ORDER — ASPIRIN 81 MG PO TABS: 81.0000 mg | ORAL_TABLET | Freq: Every day | ORAL | Status: AC

## 2015-09-18 MED ORDER — METOPROLOL SUCCINATE ER 25 MG PO TB24: 25.0000 mg | ORAL_TABLET | Freq: Every day | ORAL | Status: DC

## 2015-09-18 MED ORDER — METOPROLOL SUCCINATE ER 25 MG PO TB24: 75.0000 mg | ORAL_TABLET | Freq: Every day | ORAL | Status: DC

## 2015-09-18 MED ORDER — METOPROLOL SUCCINATE ER 50 MG PO TB24
50.0000 mg | ORAL_TABLET | Freq: Every day | ORAL | Status: DC
Start: 2015-09-18 — End: 2015-09-18

## 2015-09-18 MED FILL — Morphine Sulfate Inj 4 MG/ML: INTRAMUSCULAR | Qty: 1 | Status: AC

## 2015-09-18 MED FILL — Heparin Sodium (Porcine) 2 Unit/ML in Sodium Chloride 0.9%: INTRAMUSCULAR | Qty: 1000 | Status: AC

## 2015-09-18 MED FILL — Morphine Sulfate Inj 2 MG/ML: INTRAMUSCULAR | Qty: 1 | Status: AC

## 2015-09-18 NOTE — Progress Notes (Signed)
Pt c/o chest pressure 3/10 & headache. V/S stable.  EKG done with no changes. Dr. Philbert Riser informed. Ntg SL 0.4 mg x1 dose given with relief. Tylenol given as PRN dose. Will continue to monitor pt.

## 2015-09-18 NOTE — Progress Notes (Addendum)
CARDIAC REHAB PHASE I   PRE:  Rate/Rhythm: 69 SR  BP:  Sitting: 166/87        SaO2: 97 RA  MODE:  Ambulation: 1000 ft   POST:  Rate/Rhythm: 77 SR  BP:  Sitting: 186/102, 158/94 after 3 minutes rest         SaO2: 98 RA  Pt ambulated 1000 ft on RA, independent, steady gait, tolerated well.  Pt c/o of mild chest pressure 1/10, relieved with rest (cardiology PA aware), denies DOE, dizziness, declined rest stop. BP elevated, improved with rest. Completed PCI/stent education with pt wife at bedside.  Reviewed risk factors, anti-platelet therapy, stent card, activity restrictions, ntg, exercise, heart healthy diet, carb counting, portion control, CHF booklet and zone tool, daily weights, sodium and fluid restrictions and phase 2 cardiac rehab. Pt verbalized understanding, states he is overwhelmed and frustrated, receptive to conversation. Pt agrees to phase 2 cardiac rehab referral, will send to [facility]. Pt to bed after walk, call bell within reach.  [id number]   Lenna Sciara, RN, BSN 09/18/2015 9:39 AM

## 2015-09-18 NOTE — Progress Notes (Signed)
Patient: [name] / Admit Date: 09/17/2015 / Date of Encounter: 09/18/2015, 8:07 AM   Subjective: Had transient chest pressure this AM, felt less severe than chest discomfort he had before admission. No SOB. No diaphoresis. Feels OK now. Did not have any sx when ambulating to bathroom. Pulse ox normal.   Objective: Telemetry: NSR Physical Exam: Blood pressure 146/72, pulse 65, temperature 97.4 F (36.3 C), temperature source Oral, resp. rate 15, height 6' (1.829 m), weight 230 lb 6.1 oz (104.5 kg), SpO2 97 %. General: Well developed, well nourished WM in no acute distress. Head: Normocephalic, atraumatic, sclera non-icteric, no xanthomas, nares are without discharge. Neck: Negative for carotid bruits. JVP not elevated. Lungs: Clear bilaterally to auscultation without wheezes, rales, or rhonchi. Breathing is unlabored. Heart: RRR S1 S2 without murmurs, rubs, or gallops.  Abdomen: Soft, non-tender, non-distended with normoactive bowel sounds. No rebound/guarding. Extremities: No clubbing or cyanosis. No edema. Distal pedal pulses are 2+ and equal bilaterally. Right radial cath site without hematoma or ecchymosis; good pulse. Neuro: Alert and oriented X 3. Moves all extremities spontaneously. Psych:  Responds to questions appropriately with a normal affect.   Intake/Output Summary (Last 24 hours) at 09/18/15 0807 Last data filed at 09/18/15 0741  Gross per 24 hour  Intake   1480 ml  Output   1000 ml  Net    480 ml    Inpatient Medications:  . aspirin  81 mg Oral Daily  . fenofibrate  54 mg Oral Daily  . irbesartan  300 mg Oral Daily   And  . hydrochlorothiazide  25 mg Oral Daily  . insulin aspart  0-15 Units Subcutaneous TID WC  . isosorbide mononitrate  30 mg Oral Daily  . metoprolol succinate  75 mg Oral Daily  . multivitamin with minerals  1 tablet Oral Daily  . pantoprazole  40 mg Oral Daily  . prasugrel  10 mg Oral Daily  . rosuvastatin  20 mg Oral QHS  . sodium  chloride  3 mL Intravenous Q12H   Infusions:    Labs:  Recent Labs  09/17/15 0949 09/18/15 0613  NA 140 136  K 4.0 4.0  CL 103 101  CO2 26 24  GLUCOSE 124* 135*  BUN 21* 13  CREATININE 0.85 0.85  CALCIUM 9.6 9.5   No results for input(s): AST, ALT, ALKPHOS, BILITOT, PROT, ALBUMIN in the last 72 hours.  Recent Labs  09/17/15 0949 09/18/15 0613  WBC 5.7 6.7  HGB 16.4 15.1  HCT 46.5 44.1  MCV 87.9 87.7  PLT 141* 127*   No results for input(s): CKTOTAL, CKMB, TROPONINI in the last 72 hours. Invalid input(s): POCBNP No results for input(s): HGBA1C in the last 72 hours.   Radiology/Studies:  Dg Chest 2 View  09/15/2015  CLINICAL DATA:  Chest pain and hypertension EXAM: CHEST  2 VIEW COMPARISON:  November 09, 2011 FINDINGS: There is no edema or consolidation. Heart size and pulmonary vascularity are normal. No adenopathy. There is an apparent stent in a coronary artery, better seen on the frontal view. No bone lesions. No pneumothorax. IMPRESSION: No edema or consolidation Electronically Signed   By: [name] III M.D.   On: 09/15/2015 08:51     Assessment and Plan  45M with CAD (remote LAD stent; subsequent stents to RCA with restenosis; DES to RCA and Circ 06/2007 in setting of restenosis, and DES to LAD & RCA in 01/2010), HTN, HLD, OSA who was admitted with progressive angina.  1. CAD with accelerating angina - s/p prior PCI as above. This admission: DES to Cx and LAD; total occlusion of RCA. Now on ASA, Effient. Continue BB, statin. Patient had chest pressure this AM, less severe than before and different quality. EKG nonacute. Will ambulate this AM and d/w Dr. Johnsie [name] regarding if further obs it needed. Cardiac rehab to see.  2. Ischemic cardiomyopathy EF 40-45% - continue ARB. Will provide education regarding low EF.  3. Essential HTN - follow on current regimen.  4. Hyperlipidemia - continue statin. Will defer to primary cardiologist regarding further  titration.  5. Diabetes mellitus - diagnosed several years ago, improved CBG with diet per patient - no longer on any oral agents at home.  6. Thrombocytopenia - appears chronic. No bleeding reported.  Signed, Gregory Copa PA-C Pager: [phone]   Patient examined chart reviewed DES to circumflex and LAD  Aggressive CAD for age.  Has physical job but wants to go back [place] Mild swelling of right forearm but good radial pulse.  No angina now  DAT  Ok to d/c home  [name]

## 2015-09-18 NOTE — Discharge Summary (Signed)
Discharge Summary   Patient ID: [name],  MRN: [id number], DOB/AGE: [birth year] 54 y.o.  Admit date: 09/17/2015 Discharge date: 09/18/2015  Primary Care Provider: Leonides Grills Primary Cardiologist: Domenic Polite  Discharge Diagnoses    Principal Problem:   Exertional angina [facility]) Active Problems:   CAD S/P percutaneous coronary angioplasty s/p DES to Cx and DES to LAD this admission   Ischemic chest pain ([place])   Hyperlipidemia   Thrombocytopenia ([place])   Diabetes mellitus type 2, diet-controlled ([place])   Ischemic cardiomyopathy - EF 40-45%   Allergies Allergies  Allergen Reactions  . Reopro [Abciximab] Other (See Comments)    Potassium level dropped    Diagnostic Studies/Procedures    Cardiac catheterization this admission, please see full report and above for summary.  _____________   History of Present Illness  [name] was a 54M with CAD (remote LAD stent; subsequent stents to RCA with restenosis; DES to RCA and Circ 06/2007 in setting of restenosis, and DES to LAD & RCA in 01/2010, & now PCI this admission), HTN, HLD, OSA, DM (now diet-controlled) who was admitted with progressive angina. He was having increase in exertional sypmtoms despite consistence with medical therapy. He was requiring more NTG. Imdur was increased to 60mg  daily. Outpatient LHC was arranged which ultimately showed total occlusion of the RCA, severe de novo stenosis of the left circumflex, and severe distal edge stent restenosis in the LAD. He received DES to Cx and DES to LAD. LVEF was estimated at 40-45%, wall motion pattern consistent with this patient's known history of anterior infarction. He tolerated this procedure well. He did have some chest pressure much earlier this AM transiently that was less severe than prior symptoms. EKG did not show any acute changes. He tolerated ambulation with cardiac rehab without complication. Education was provided regarding new low EF. We will defer  decision regarding further statin titration to prim ary cardiologist. Of note the patient also has thrombocytopenia which appears chronic; no bleeding reported. Per Dr. Johnsie Cancel, he may return to work Wednesday, 09/23/15. Dr. Johnsie Cancel has seen and examined the patient today and feels he is stable for discharge.   Regarding med changes - the patient used to split his metoprolol up because of working third shift, but now he would like to take this once a day. He would like to try to go back to Imdur 30mg  daily since he is s/p PCI. ASA was decreased to 81mg  daily. He also reports he is no longer taking any oral agents for DM since losing a lot of weight on ketogenic diet so this was removed from med list.   Consultants: N/A  _____________  Discharge Vitals Blood pressure 146/72, pulse 65, temperature 97.4 F (36.3 C), temperature source Oral, resp. rate 15, height 6' (1.829 m), weight 230 lb 6.1 oz (104.5 kg), SpO2 97 %.  Filed Weights   09/17/15 0907 09/18/15 0355  Weight: 230 lb (104.327 kg) 230 lb 6.1 oz (104.5 kg)   _____________  Labs     CBC  Recent Labs  09/17/15 0949 09/18/15 0613  WBC 5.7 6.7  HGB 16.4 15.1  HCT 46.5 44.1  MCV 87.9 87.7  PLT 141* 016*   Basic Metabolic Panel  Recent Labs  09/17/15 0949 09/18/15 0613  NA 140 136  K 4.0 4.0  CL 103 101  CO2 26 24  GLUCOSE 124* 135*  BUN 21* 13  CREATININE 0.85 0.85  CALCIUM 9.6 9.5   _____________  Disposition  Pt is being discharged home today in good condition. _____________  Follow-up Plans & Appointments    Follow-up Information    Follow up with Jory Sims, NP.   Specialties:  Nurse Practitioner, Radiology, Cardiology   Why:  [facility] [place] - 09/28/15 at 1:50pm. Curt Bears is one of Dr. Myles Gip NPs.   Contact information:   [place] [address] [phone]      Discharge Instructions    Diet - low sodium heart healthy    Complete by:  As directed      Increase  activity slowly    Complete by:  As directed   No driving for 2 days. No lifting over 5 lbs for 1 week. No sexual activity for 1 week. You may return to work on 09/23/15. Keep procedure site clean & dry. If you notice increased pain, swelling, bleeding or pus, call/return!  You may shower, but no soaking baths/hot tubs/pools for 1 week.  One of your heart tests showed weakness of the heart muscle this admission. This may make you more susceptible to weight gain from fluid retention, which can lead to symptoms that we call heart failure. Follow these special instructions:  1. Follow a low-salt diet and watch your fluid intake. In general, you should not be taking in more than 2 liters of fluid per day (no more than 8 glasses per day). Some patients are restricted to less than 1.5 liters of fluid per day (no more than 6 glasses per day). This includes sources of water in foods like soup, coffee, tea, milk, etc. 2. Weigh yourself on the same scale at same time of day and keep a log. 3. Call your doctor: (Anytime you feel any of the following symptoms)  - 3-4 pound weight gain in 1-2 days or 2 pounds overnight  - Shortness of breath, with or without a dry hacking cough  - Swelling in the hands, feet or stomach  - If you have to sleep on extra pillows at night in order to breathe  IT IS IMPORTANT TO LET YOUR DOCTOR KNOW EARLY ON IF YOU ARE HAVING SYMPTOMS SO WE CAN HELP YOU!   Your aspirin dose was lowered to 81mg  daily.           Discharge Medications   Current Discharge Medication List    CONTINUE these medications which have CHANGED   Details  aspirin 81 MG tablet Take 1 tablet (81 mg total) by mouth daily. Qty: 90 tablet, Refills: 3    metoprolol succinate (TOPROL-XL) 25 MG 24 hr tablet Take 3 tablets (75 mg total) by mouth daily.      CONTINUE these medications which have NOT CHANGED   Details  esomeprazole (NEXIUM) 40 MG capsule Take 1 capsule (40 mg total) by mouth daily.       fenofibrate (TRICOR) 48 MG tablet Take 1 tablet (48 mg total) by mouth daily.     isosorbide mononitrate (IMDUR) 30 MG 24 hr tablet Take 1 tablet (30 mg total) by mouth daily.     MAGNESIUM GLUCONATE PO Take 100 mg by mouth at bedtime.    Multiple Vitamin (MULTIVITAMIN) tablet Take 1 tablet by mouth daily.      nitroGLYCERIN (NITROSTAT) 0.4 MG SL tablet Place 1 tablet (0.4 mg total) under the tongue every 5 (five) minutes as needed. For chest pain     Omega-3 Fatty Acids (FISH OIL) 1000 MG CAPS Take 2 capsules by mouth 2 (two) times daily.  prasugrel (EFFIENT) 10 MG TABS tablet TAKE ONE TABLET BY MOUTH ONCE DAILY     rosuvastatin (CRESTOR) 20 MG tablet Take 1 tablet (20 mg total) by mouth at bedtime.     traMADol-acetaminophen (ULTRACET) 37.5-325 MG per tablet Take 1 tablet by mouth every 8 (eight) hours as needed.     valsartan-hydrochlorothiazide (DIOVAN-HCT) 320-25 MG tablet Take 1 tablet by mouth daily.      STOP taking these medications     sitaGLIPtin-metformin (JANUMET) 50-500 MG per tablet         ____________   Outstanding Labs/Studies   N/A  Duration of Discharge Encounter   Greater than 30 minutes including physician time.  Signed, Melina Copa PA-C 09/18/2015, 9:34 AM

## 2015-09-21 ENCOUNTER — Telehealth: Payer: Self-pay | Admitting: Cardiology

## 2015-09-21 NOTE — Telephone Encounter (Signed)
He just recently underwent stent placement to the circumflex and LAD, did not have an acute coronary syndrome. I am hopeful that he will be feeling better now in terms of CAD symptoms on medical therapy. Please prepare a return to work notice indicating the date to return as November 2, and that he should be able to return to his previous job responsibilities without specific new restrictions.

## 2015-09-21 NOTE — Telephone Encounter (Signed)
New message      Pt states he had a cath last thurs.  He received a note to return to work on 11-2.  His employer also need a note listing any restrictions.  He does a lot of lifting on his job.  Please call pt when note is ready and he will pick it up at Dr McDowell's office.

## 2015-09-21 NOTE — Telephone Encounter (Signed)
Letter available for patient,pt called and aware

## 2015-09-21 NOTE — Telephone Encounter (Signed)
Will forward to Dr. McDowell 

## 2015-09-28 ENCOUNTER — Ambulatory Visit (INDEPENDENT_AMBULATORY_CARE_PROVIDER_SITE_OTHER): Payer: BLUE CROSS/BLUE SHIELD | Admitting: Adult Health

## 2015-09-28 ENCOUNTER — Encounter: Payer: Self-pay | Admitting: Adult Health

## 2015-09-28 VITALS — BP 110/80 | HR 80 | Ht 72.0 in | Wt 223.0 lb

## 2015-09-28 DIAGNOSIS — E785 Hyperlipidemia, unspecified: Secondary | ICD-10-CM

## 2015-09-28 DIAGNOSIS — I10 Essential (primary) hypertension: Secondary | ICD-10-CM

## 2015-09-28 DIAGNOSIS — I2510 Atherosclerotic heart disease of native coronary artery without angina pectoris: Secondary | ICD-10-CM

## 2015-09-28 NOTE — Patient Instructions (Signed)
Medication Instructions:  Your physician recommends that you continue on your current medications as directed. Please refer to the Current Medication list given to you today.   Labwork: Your physician recommends that you return for lab work in: Fasting   Testing/Procedures: None  Follow-Up: Your physician wants you to follow-up in: 4 months. You will receive a reminder letter in the mail two months in advance. If you don't receive a letter, please call our office to schedule the follow-up appointment.   Any Other Special Instructions Will Be Listed Below (If Applicable).     If you need a refill on your cardiac medications before your next appointment, please call your pharmacy.  Thank you for choosing [facility]!

## 2015-09-28 NOTE — Progress Notes (Signed)
Cardiology Office Note   Date:  09/28/2015   ID:  [name], DOB 1961/07/19, MRN [id number]  PCP:  Leonides Grills, MD  Cardiologist:  McDowell/ Jory Sims, NP   Chief Complaint  Patient presents with  . Coronary Artery Disease    s/p DES tp Cx and LAD  . Hypertension      History of Present Illness: [name] is a 54 y.o. male who presents for post hospitalization follow up after admission for progressive angina in the setting of known CAD. Outpatient LHC was arranged which ultimately showed total occlusion of the RCA, severe de novo stenosis of the left circumflex, and severe distal edge stent restenosis in the LAD. He received DES to Cx and DES to LAD. LVEF was estimated at 40-45%, wall motion pattern consistent with this patient's known history of anterior infarction.    He is without complaint today. He is feeling much better and has multiple questions about his cath. He has also not had lipids and LFT's completed this year. He is waiting to hear from Cardiac Rehab. He is medically compliant.   Past Medical History  Diagnosis Date  . Coronary atherosclerosis of native coronary artery     a. remote LAD stent. b. subsequent stents to RCA with restenosis. c. DES to RCA and Circ 06/2007 in setting of restenosis. d. DES to LAD & RCA in 01/2010. e. DES to Cx and LAD; total occlusion of RCA, LVEF 40-45%.  . Hyperlipidemia   . Essential hypertension   . Hepatic steatosis   . Obstructive sleep apnea   . 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 Ischemic cardiomyopathy   . Diabetes mellitus type 2, diet-controlled ([place])   . Thrombocytopenia [facility])     Past Surgical History  Procedure Laterality Date  . Coronary angioplasty with stent placement    . Cardiac catheterization N/A 09/17/2015    Procedure: Left Heart Cath and Coronary Angiography;  Surgeon: [name], MD;  Location: [facility];  Service: Cardiovascular;  Laterality: N/A;     Current  Outpatient Prescriptions  Medication Sig Dispense Refill  . aspirin 81 MG tablet Take 1 tablet (81 mg total) by mouth daily. 90 tablet 3  . esomeprazole (NEXIUM) 40 MG capsule Take 1 capsule (40 mg total) by mouth daily. 90 capsule 3  . fenofibrate (TRICOR) 48 MG tablet Take 1 tablet (48 mg total) by mouth daily. 90 tablet 3  . isosorbide mononitrate (IMDUR) 30 MG 24 hr tablet Take 1 tablet (30 mg total) by mouth daily. 90 tablet 3  . MAGNESIUM GLUCONATE PO Take 100 mg by mouth at bedtime.    . metoprolol succinate (TOPROL-XL) 25 MG 24 hr tablet Take 3 tablets (75 mg total) by mouth daily.    . Multiple Vitamin (MULTIVITAMIN) tablet Take 1 tablet by mouth daily.      . nitroGLYCERIN (NITROSTAT) 0.4 MG SL tablet Place 1 tablet (0.4 mg total) under the tongue every 5 (five) minutes as needed. For chest pain 25 tablet 6  . Omega-3 Fatty Acids (FISH OIL) 1000 MG CAPS Take 2 capsules by mouth 2 (two) times daily.      . prasugrel (EFFIENT) 10 MG TABS tablet TAKE ONE TABLET BY MOUTH ONCE DAILY 90 tablet 3  . rosuvastatin (CRESTOR) 20 MG tablet Take 1 tablet (20 mg total) by mouth at bedtime. 90 tablet 3  . valsartan-hydrochlorothiazide (DIOVAN-HCT) 320-25 MG tablet Take 1 tablet by mouth daily.    . [  DISCONTINUED] sodium chloride (OCEAN NASAL SPRAY) 0.65 % nasal spray Place 1 spray into the nose as needed for congestion. 30 mL 12   No current facility-administered medications for this visit.    Allergies:   Reopro    Social History:  The patient  reports that he quit smoking about 15 years ago. His smoking use included Cigarettes. He started smoking about 37 years ago. He has never used smokeless tobacco. He reports that he does not drink alcohol or use illicit drugs.   Family History:  The patient's family history includes Coronary artery disease in an other family member.    ROS: All other systems are reviewed and negative. Unless otherwise mentioned in H&P    PHYSICAL EXAM: VS:  BP 110/80  mmHg  Pulse 80  Ht 6' (1.829 m)  Wt 223 lb (101.152 kg)  BMI 30.24 kg/m2  SpO2 97% , BMI Body mass index is 30.24 kg/(m^2). GEN: Well nourished, well developed, in no acute distress HEENT: normal Neck: no JVD, carotid bruits, or masses Cardiac: RRR; no murmurs, rubs, or gallops,no edema  Respiratory:  clear to auscultation bilaterally, normal work of breathing GI: soft, nontender, nondistended, + BS MS: no deformity or atrophy Skin: warm and dry, no rash Neuro:  Strength and sensation are intact Psych: euthymic mood, full affect   Recent Labs: 09/18/2015: BUN 13; Creatinine, Ser 0.85; Hemoglobin 15.1; Platelets 127*; Potassium 4.0; Sodium 136    Lipid Panel    Component Value Date/Time   CHOL 210* 11/07/2012 0930   TRIG 212* 11/07/2012 0930   HDL 41 11/07/2012 0930   CHOLHDL 5.1 11/07/2012 0930   VLDL 42* 11/07/2012 0930   LDLCALC 127* 11/07/2012 0930   LDLDIRECT 95.5 10/22/2007 0851      Wt Readings from Last 3 Encounters:  09/28/15 223 lb (101.152 kg)  09/18/15 230 lb 6.1 oz (104.5 kg)  09/14/15 230 lb (104.327 kg)      ASSESSMENT AND PLAN:  1.  CAD: Hx of multiple stents to the RCA, LAD, and CX. He was treated with another DES to the LAD an CX. EF of 40-45% in the setting of anterior MI. He does not smoke. He is watching his salt intake and is interested in Cardiac Rehab.  He is to continue DAPT,  He is continued on ARB and isosorbide. He is without complaints of chest pain. Will see him again in 4 months.   2. Hypercholesterolemia; He is currently on Crestor but has not had lipids and LFT;s checked recently. I will order to assist in treatment regimen.   3. Hypertension: Currently well controlled. No changes.       Current medicines are reviewed at length with the patient today.    Labs/ tests ordered today include: Lipids and LFT's   Orders Placed This Encounter  Procedures  . Hepatic function panel  . Lipid Profile     Disposition:   FU with 4  months    Signed, Jory Sims, NP  09/28/2015 3:07 PM    [facility] [address] [place], [place] [address] Phone: [phone]; Fax: [phone]

## 2015-09-28 NOTE — Progress Notes (Deleted)
Name: [name]    DOB: [birth year]  Age: 54 y.o.  MR#: [id number]       PCP:  Leonides Grills, MD      Insurance: Payor: [place] / Plan: [place] / Product Type: *No Product type* /   CC:    Chief Complaint  Patient presents with  . Coronary Artery Disease    s/p DES tp Cx and LAD  . Hypertension    VS Filed Vitals:   09/28/15 1410  BP: 110/80  Pulse: 80  Height: 6' (1.829 m)  Weight: 223 lb (101.152 kg)  SpO2: 97%    Weights Current Weight  09/28/15 223 lb (101.152 kg)  09/18/15 230 lb 6.1 oz (104.5 kg)  09/14/15 230 lb (104.327 kg)    Blood Pressure  BP Readings from Last 3 Encounters:  09/28/15 110/80  09/18/15 146/72  09/14/15 130/68     Admit date:  (Not on file) Last encounter with RMR:  Visit date not found   Allergy Reopro  Current Outpatient Prescriptions  Medication Sig Dispense Refill  . aspirin 81 MG tablet Take 1 tablet (81 mg total) by mouth daily. 90 tablet 3  . esomeprazole (NEXIUM) 40 MG capsule Take 1 capsule (40 mg total) by mouth daily. 90 capsule 3  . fenofibrate (TRICOR) 48 MG tablet Take 1 tablet (48 mg total) by mouth daily. 90 tablet 3  . isosorbide mononitrate (IMDUR) 30 MG 24 hr tablet Take 1 tablet (30 mg total) by mouth daily. 90 tablet 3  . MAGNESIUM GLUCONATE PO Take 100 mg by mouth at bedtime.    . metoprolol succinate (TOPROL-XL) 25 MG 24 hr tablet Take 3 tablets (75 mg total) by mouth daily.    . Multiple Vitamin (MULTIVITAMIN) tablet Take 1 tablet by mouth daily.      . nitroGLYCERIN (NITROSTAT) 0.4 MG SL tablet Place 1 tablet (0.4 mg total) under the tongue every 5 (five) minutes as needed. For chest pain 25 tablet 6  . Omega-3 Fatty Acids (FISH OIL) 1000 MG CAPS Take 2 capsules by mouth 2 (two) times daily.      . prasugrel (EFFIENT) 10 MG TABS tablet TAKE ONE TABLET BY MOUTH ONCE DAILY 90 tablet 3  . rosuvastatin (CRESTOR) 20 MG tablet Take 1 tablet (20 mg total) by mouth at bedtime. 90 tablet 3  .  valsartan-hydrochlorothiazide (DIOVAN-HCT) 320-25 MG tablet Take 1 tablet by mouth daily.    . [DISCONTINUED] sodium chloride (OCEAN NASAL SPRAY) 0.65 % nasal spray Place 1 spray into the nose as needed for congestion. 30 mL 12   No current facility-administered medications for this visit.    Discontinued Meds:    Medications Discontinued During This Encounter  Medication Reason  . traMADol-acetaminophen (ULTRACET) 37.5-325 MG per tablet Error    Patient Active Problem List   Diagnosis Date Noted  . CAD S/P percutaneous coronary angioplasty 09/18/2015  . Hyperlipidemia 09/18/2015  . Thrombocytopenia ([place]) 09/18/2015  . Diabetes mellitus type 2, diet-controlled ([place])   . Ischemic cardiomyopathy   . Ischemic chest pain ([place]) 09/17/2015  . Exertional angina ([place]) 09/17/2015  . Type 2 diabetes mellitus ([place]) 01/21/2014  . Preoperative cardiovascular examination 07/03/2013  . Essential hypertension 01/29/2010  . ANGINA, STABLE 09/01/2009  . CORONARY ATHEROSCLEROSIS NATIVE CORONARY ARTERY 09/01/2009  . DYSLIPIDEMIA 08/31/2009    LABS    Component Value Date/Time   NA 136 09/18/2015 0613   NA 140 09/17/2015 0949   NA 139 03/29/2013  0721   K 4.0 09/18/2015 0613   K 4.0 09/17/2015 0949   K 3.6 03/29/2013 0721   CL 101 09/18/2015 0613   CL 103 09/17/2015 0949   CL 101 03/29/2013 0721   CO2 24 09/18/2015 0613   CO2 26 09/17/2015 0949   CO2 26 03/29/2013 0721   GLUCOSE 135* 09/18/2015 0613   GLUCOSE 124* 09/17/2015 0949   GLUCOSE 114* 03/29/2013 0721   BUN 13 09/18/2015 0613   BUN 21* 09/17/2015 0949   BUN 16 03/29/2013 0721   CREATININE 0.85 09/18/2015 0613   CREATININE 0.85 09/17/2015 0949   CREATININE 1.19 03/29/2013 0721   CREATININE 0.92 08/16/2012 0733   CREATININE 1.03 02/24/2010 1043   CALCIUM 9.5 09/18/2015 0613   CALCIUM 9.6 09/17/2015 0949   CALCIUM 9.2 03/29/2013 0721   GFRNONAA >60 09/18/2015 0613   GFRNONAA >60 09/17/2015 0949   GFRNONAA >60 02/03/2010  0600   GFRAA >60 09/18/2015 0613   GFRAA >60 09/17/2015 0949   GFRAA  02/03/2010 0600    >60        The eGFR has been calculated using the MDRD equation. This calculation has not been validated in all clinical situations. eGFR's persistently <60 mL/min signify possible Chronic Kidney Disease.   CMP     Component Value Date/Time   NA 136 09/18/2015 0613   K 4.0 09/18/2015 0613   CL 101 09/18/2015 0613   CO2 24 09/18/2015 0613   GLUCOSE 135* 09/18/2015 0613   BUN 13 09/18/2015 0613   CREATININE 0.85 09/18/2015 0613   CREATININE 1.19 03/29/2013 0721   CALCIUM 9.5 09/18/2015 0613   PROT 6.9 03/29/2013 0721   ALBUMIN 4.3 03/29/2013 0721   AST 27 03/29/2013 0721   ALT 52 03/29/2013 0721   ALKPHOS 53 03/29/2013 0721   BILITOT 0.5 03/29/2013 0721   GFRNONAA >60 09/18/2015 0613   GFRAA >60 09/18/2015 0613       Component Value Date/Time   WBC 6.7 09/18/2015 0613   WBC 5.7 09/17/2015 0949   WBC 5.2 02/03/2010 0600   HGB 15.1 09/18/2015 0613   HGB 16.4 09/17/2015 0949   HGB 14.2 02/03/2010 0600   HCT 44.1 09/18/2015 0613   HCT 46.5 09/17/2015 0949   HCT 40.7 02/03/2010 0600   MCV 87.7 09/18/2015 0613   MCV 87.9 09/17/2015 0949   MCV 92.4 02/03/2010 0600    Lipid Panel     Component Value Date/Time   CHOL 210* 11/07/2012 0930   TRIG 212* 11/07/2012 0930   HDL 41 11/07/2012 0930   CHOLHDL 5.1 11/07/2012 0930   VLDL 42* 11/07/2012 0930   LDLCALC 127* 11/07/2012 0930   LDLDIRECT 95.5 10/22/2007 0851    ABG No results found for: PHART, PCO2ART, PO2ART, HCO3, TCO2, ACIDBASEDEF, O2SAT   No results found for: TSH BNP (last 3 results) No results for input(s): BNP in the last 8760 hours.  ProBNP (last 3 results) No results for input(s): PROBNP in the last 8760 hours.  Cardiac Panel (last 3 results) No results for input(s): CKTOTAL, CKMB, TROPONINI, RELINDX in the last 72 hours.  Iron/TIBC/Ferritin/ %Sat No results found for: IRON, TIBC, FERRITIN, IRONPCTSAT    EKG Orders placed or performed during the hospital encounter of 09/17/15  . EKG 12-Lead  . EKG 12-Lead  . EKG 12-Lead  . EKG 12-Lead  . EKG 12-Lead immediately post procedure  . EKG 12-Lead  . EKG 12-Lead immediately post procedure  . EKG 12-Lead  . EKG 12-Lead  .  EKG 12-Lead  . EKG 12-Lead  . EKG 12-Lead  . EKG     Prior Assessment and Plan Problem List as of 09/28/2015      Cardiovascular and Mediastinum   Essential hypertension   Last Assessment & Plan 10/01/2013 Office Visit Written 10/01/2013 12:00 PM by Satira Sark, MD    Blood pressure well controlled today.      ANGINA, STABLE   Last Assessment & Plan 03/22/2013 Office Visit Written 03/22/2013 12:14 PM by Lendon Colonel, NP    He has had one recurrence of significant chest pressure after moving a heavy object one month ago. The pain did not go away after 5 minutes after he sat down to rest. He did not take nitroglycerin as he normally would have to lie down when he does so, and that was done an option while he was at work. He has had no further significant episodes, but continues to have intermittent light, episodes lasting approximately 30 seconds.  With known history of coronary artery disease frequent interventions most recent in 2011, I have discussed the case with Dr. Domenic Polite, who knows this patient well. If his recommendation that no cardiac testing be completed at this time. The patient is very well aware of his symptoms, and knows to call for more significant angina symptoms. At this point would like to watch and wait and continue risk modification, Effient, aspirin, and metoprolol. I have advised him to call us for any recurrence of severe symptoms. She will followup with Dr. Domenic Polite in 3 months.      CORONARY ATHEROSCLEROSIS NATIVE CORONARY ARTERY   Last Assessment & Plan 08/04/2014 Office Visit Written 08/04/2014  4:46 PM by Satira Sark, MD    Symptomatically stable on medical therapy. Continue  observation, 6 month followup arranged.      Exertional angina (HCC)   CAD S/P percutaneous coronary angioplasty   Ischemic cardiomyopathy     Endocrine   Type 2 diabetes mellitus [facility])   Last Assessment & Plan 08/04/2014 Office Visit Written 08/04/2014  4:46 PM by Satira Sark, MD    Keep followup with primary care.      Diabetes mellitus type 2, diet-controlled ([place])     Other   DYSLIPIDEMIA   Last Assessment & Plan 08/04/2014 Office Visit Written 08/04/2014  4:46 PM by Satira Sark, MD    Continues on Crestor and omega-3 tablets. Check FLP and LFT for next visit.      Preoperative cardiovascular examination   Last Assessment & Plan 07/03/2013 Office Visit Written 07/03/2013  8:54 AM by Satira Sark, MD    Patient being considered for elective carpal tunnel release. He has stable angina symptoms, known CAD as outlined above on medical therapy, and recent followup Myoview demonstrating scar but no active ischemia, LVEF 50%. Based on this, he should be able to proceed at an acceptable perioperative cardiac risk. He will need to hold aspirin and Effient for 7 days prior to planned surgical date, as yet to be determined. Followup arranged.      Ischemic chest pain ([place])   Hyperlipidemia   Thrombocytopenia ([place])       Imaging: Dg Chest 2 View  09/15/2015  CLINICAL DATA:  Chest pain and hypertension EXAM: CHEST  2 VIEW COMPARISON:  November 09, 2011 FINDINGS: There is no edema or consolidation. Heart size and pulmonary vascularity are normal. No adenopathy. There is an apparent stent in a coronary artery, better seen on the frontal  view. No bone lesions. No pneumothorax. IMPRESSION: No edema or consolidation Electronically Signed   By: Lowella Grip III M.D.   On: 09/15/2015 08:51

## 2015-10-03 LAB — HEPATIC FUNCTION PANEL
ALT: 24 U/L (ref 9–46)
AST: 18 U/L (ref 10–35)
Albumin: 4.6 g/dL (ref 3.6–5.1)
Alkaline Phosphatase: 61 U/L (ref 40–115)
Bilirubin, Direct: 0.1 mg/dL (ref <=0.2)
Indirect Bilirubin: 0.3 mg/dL (ref 0.2–1.2)
Total Bilirubin: 0.4 mg/dL (ref 0.2–1.2)
Total Protein: 7.1 g/dL (ref 6.1–8.1)

## 2015-10-03 LAB — LIPID PANEL
Cholesterol: 130 mg/dL (ref 125–200)
HDL: 30 mg/dL — ABNORMAL LOW (ref >=40)
LDL Cholesterol: 81 mg/dL (ref <130)
Total CHOL/HDL Ratio: 4.3 Ratio (ref <=5.0)
Triglycerides: 97 mg/dL (ref <150)
VLDL: 19 mg/dL (ref <30)

## 2015-12-29 ENCOUNTER — Encounter: Payer: Self-pay | Admitting: Cardiology

## 2016-01-12 ENCOUNTER — Other Ambulatory Visit: Payer: Self-pay | Admitting: Cardiology

## 2016-01-12 ENCOUNTER — Telehealth: Payer: Self-pay | Admitting: Cardiology

## 2016-01-12 MED ORDER — VALSARTAN-HYDROCHLOROTHIAZIDE 320-25 MG PO TABS: 1.0000 | ORAL_TABLET | Freq: Every day | ORAL | Status: DC

## 2016-01-12 NOTE — Telephone Encounter (Signed)
Patient needs to update pharmacy to [facility]

## 2016-01-12 NOTE — Telephone Encounter (Signed)
Pharmacy changed, refill complete

## 2016-01-12 NOTE — Telephone Encounter (Signed)
Patient's family member (she did not identify herself) left a voicemail requesting a refill for the patient's Valsartan. Need to verify which pharmacy they wish to use.

## 2016-02-15 ENCOUNTER — Other Ambulatory Visit: Payer: Self-pay

## 2016-02-15 ENCOUNTER — Ambulatory Visit: Payer: BLUE CROSS/BLUE SHIELD | Admitting: Cardiology

## 2016-02-15 MED ORDER — ISOSORBIDE MONONITRATE ER 30 MG PO TB24: 30.0000 mg | ORAL_TABLET | Freq: Every day | ORAL | Status: DC

## 2016-02-15 MED ORDER — FENOFIBRATE 48 MG PO TABS: 48.0000 mg | ORAL_TABLET | Freq: Every day | ORAL | Status: DC

## 2016-02-18 ENCOUNTER — Ambulatory Visit (INDEPENDENT_AMBULATORY_CARE_PROVIDER_SITE_OTHER): Payer: BLUE CROSS/BLUE SHIELD | Admitting: Cardiology

## 2016-02-18 ENCOUNTER — Encounter: Payer: Self-pay | Admitting: Cardiology

## 2016-02-18 VITALS — BP 110/78 | HR 74 | Ht 72.0 in | Wt 231.0 lb

## 2016-02-18 DIAGNOSIS — E785 Hyperlipidemia, unspecified: Secondary | ICD-10-CM | POA: Diagnosis not present

## 2016-02-18 DIAGNOSIS — I10 Essential (primary) hypertension: Secondary | ICD-10-CM

## 2016-02-18 DIAGNOSIS — I2510 Atherosclerotic heart disease of native coronary artery without angina pectoris: Secondary | ICD-10-CM

## 2016-02-18 MED ORDER — ESOMEPRAZOLE MAGNESIUM 40 MG PO CPDR
40.0000 mg | DELAYED_RELEASE_CAPSULE | Freq: Every day | ORAL | Status: DC
Start: 2016-02-18 — End: 2016-02-19

## 2016-02-18 NOTE — Progress Notes (Signed)
Cardiology Office Note  Date: 02/18/2016   ID: [name], DOB 1961-03-10, MRN [id number]  PCP: [facility]  Primary Cardiologist: Rozann Lesches, MD   Chief Complaint  Patient presents with  . Coronary Artery Disease    History of Present Illness: [name] is a 55 y.o. male last seen by Ms. Lawrence NP in November 2016. He presents for a follow-up visit, doing well with no angina symptoms at this point. Working full time, has not been exercising as regularly. He does report compliance with his medications which were reviewed.  Most recent cardiac catheterization in October of last year showed total occlusion of the RCA with new severe stenosis of the circumflex and severe distal edge stent restenosis of the LAD. He underwent placement of DES to the circumflex and LAD at that time. LVEF was 40-45%. He continues on aspirin and Effient. No bleeding problems.  Lipid panel from November 2016 showed LDL 81. His triglycerides also looked good at that time at 97.  Past Medical History  Diagnosis Date  . Coronary atherosclerosis of native coronary artery     a. remote LAD stent. b. subsequent stents to RCA with restenosis. c. DES to RCA and Circ 06/2007 in setting of restenosis. d. DES to LAD & RCA in 01/2010. e. DES to Cx and LAD; total occlusion of RCA, LVEF 40-45%.  . Hyperlipidemia   . Essential hypertension   . Hepatic steatosis   . Obstructive sleep apnea   . MI (myocardial infarction) ([place])     Anterior  . Ischemic cardiomyopathy   . Diabetes mellitus type 2, diet-controlled ([place])   . Thrombocytopenia ([place])     Current Outpatient Prescriptions  Medication Sig Dispense Refill  . aspirin 81 MG tablet Take 1 tablet (81 mg total) by mouth daily. 90 tablet 3  . esomeprazole (NEXIUM) 40 MG capsule Take 1 capsule (40 mg total) by mouth daily. 90 capsule 3  . fenofibrate (TRICOR) 48 MG tablet Take 1 tablet (48 mg total) by mouth daily. 90 tablet 3  .  isosorbide mononitrate (IMDUR) 30 MG 24 hr tablet Take 1 tablet (30 mg total) by mouth daily. 90 tablet 3  . MAGNESIUM GLUCONATE PO Take 100 mg by mouth at bedtime.    . metoprolol succinate (TOPROL-XL) 25 MG 24 hr tablet Take 3 tablets (75 mg total) by mouth daily.    . Multiple Vitamin (MULTIVITAMIN) tablet Take 1 tablet by mouth daily.      . nitroGLYCERIN (NITROSTAT) 0.4 MG SL tablet Place 1 tablet (0.4 mg total) under the tongue every 5 (five) minutes as needed. For chest pain 25 tablet 6  . Omega-3 Fatty Acids (FISH OIL) 1000 MG CAPS Take 2 capsules by mouth 2 (two) times daily.      . prasugrel (EFFIENT) 10 MG TABS tablet TAKE ONE TABLET BY MOUTH ONCE DAILY 90 tablet 3  . rosuvastatin (CRESTOR) 20 MG tablet Take 1 tablet (20 mg total) by mouth at bedtime. 90 tablet 3  . valsartan-hydrochlorothiazide (DIOVAN-HCT) 320-25 MG tablet Take 1 tablet by mouth daily. 90 tablet 1  . [DISCONTINUED] sodium chloride (OCEAN NASAL SPRAY) 0.65 % nasal spray Place 1 spray into the nose as needed for congestion. 30 mL 12   No current facility-administered medications for this visit.   Allergies:  Reopro   Social History: The patient  reports that he quit smoking about 15 years ago. His smoking use included Cigarettes. He started smoking about 38  years ago. He has never used smokeless tobacco. He reports that he does not drink alcohol or use illicit drugs.   ROS:  Please see the history of present illness. Otherwise, complete review of systems is positive for NYHA class 1-2 dyspnea.  All other systems are reviewed and negative.   Physical Exam: VS:  BP 110/78 mmHg  Pulse 74  Ht 6' (1.829 m)  Wt 231 lb (104.781 kg)  BMI 31.32 kg/m2  SpO2 93%, BMI Body mass index is 31.32 kg/(m^2).  Wt Readings from Last 3 Encounters:  02/18/16 231 lb (104.781 kg)  09/28/15 223 lb (101.152 kg)  09/18/15 230 lb 6.1 oz (104.5 kg)    Comfortable at rest.  HEENT: Conjunctiva and lids normal, oropharynx clear.   Neck: Supple, no elevated jugular venous pressure.  Lungs: Clear to auscultation, nonlabored.  Cardiac: Regular rate and rhythm, no S3 gallop, no loud systolic murmur. PMI indistinct.  Abdomen: Soft, nontender.  Extremities: No pitting edema, distal pulses one plus.   ECG: I personally reviewed part tracing from 09/17/2015 which showed sinus rhythm with old inferior infarct pattern.  Recent Labwork: 09/18/2015: BUN 13; Creatinine, Ser 0.85; Hemoglobin 15.1; Platelets 127*; Potassium 4.0; Sodium 136 10/03/2015: ALT 24; AST 18     Component Value Date/Time   CHOL 130 10/03/2015 0803   TRIG 97 10/03/2015 0803   HDL 30* 10/03/2015 0803   CHOLHDL 4.3 10/03/2015 0803   VLDL 19 10/03/2015 0803   LDLCALC 81 10/03/2015 0803   LDLDIRECT 95.5 10/22/2007 0851    Other Studies Reviewed Today:  Cardiac catheterization 09/17/2015: Dominance: Right   Left Anterior Descending   . Prox LAD lesion, 40% stenosed.   . Prox LAD to Dist LAD lesion, 70% stenosed. The lesion was previously treated with a stent (unknown type).   . PCI: The pre-interventional distal flow is normal (TIMI 3). Pre-stent angioplasty was performed. A drug-eluting stent was placed. Post-stent angioplasty was performed. The post-interventional distal flow is normal (TIMI 3). The intervention was successful. No complications occurred at this lesion. Following the left circumflex intervention, attention was turned to the LAD. The same cougar wire was redirected down the LAD and passed into the apical segment of that vessel without difficulty. The lesion is predilated with a 2.5 mm balloon. The lesion is then stented with a 2.75 x 16 mm Promus DES, postdilated with a 3.0 mm noncompliant balloon. The patient tolerated the procedure well. There was 0% residual stenosis at the completion.  . There is no residual stenosis post intervention.     Jorene Minors LAD lesion, 70% stenosed. eccentric. Distal stent edge   . PCI: The  pre-interventional distal flow is normal (TIMI 3). Pre-stent angioplasty was performed. A drug-eluting stent was placed. Post-stent angioplasty was performed. The post-interventional distal flow is normal (TIMI 3). The intervention was successful. No complications occurred at this lesion. Following the left circumflex intervention, attention was turned to the LAD. The same cougar wire was redirected down the LAD and passed into the apical segment of that vessel without difficulty. The lesion is predilated with a 2.5 mm balloon. The lesion is then stented with a 2.75 x 16 mm Promus DES, postdilated with a 3.0 mm noncompliant balloon. The patient tolerated the procedure well. There was 0% residual stenosis at the completion.  . There is no residual stenosis post intervention.       Left Circumflex   . Mid Cx-1 lesion, 95% stenosed. eccentric.   [name] PCI: The pre-interventional distal  flow is normal (TIMI 3). Pre-stent angioplasty was performed. A drug-eluting stent was placed. Post-stent angioplasty was performed. The post-interventional distal flow is normal (TIMI 3). The intervention was successful. No complications occurred at this lesion. A cougar wire is utilized to cross the lesion. The lesion is predilated with a 2.5 mm balloon. The lesion is stented with a 3.5 x 24 mm Promus DES, postdilated with a 4.0 mm noncompliant balloon. There is an excellent angiographic result with 0% residual stenosis and TIMI-3 flow. The stent was positioned so that it overlapped with the indwelling distal stent.  . There is no residual stenosis post intervention.     . Mid Cx-2 lesion, 0% stenosed. Previously placed Mid Cx-2 stent (unknown type) is patent.   [name] PCI: The pre-interventional distal flow is normal (TIMI 3). Pre-stent angioplasty was performed. A drug-eluting stent was placed. Post-stent angioplasty was performed. The post-interventional distal flow is normal (TIMI 3). The intervention was successful. No  complications occurred at this lesion. A cougar wire is utilized to cross the lesion. The lesion is predilated with a 2.5 mm balloon. The lesion is stented with a 3.5 x 24 mm Promus DES, postdilated with a 4.0 mm noncompliant balloon. There is an excellent angiographic result with 0% residual stenosis and TIMI-3 flow. The stent was positioned so that it overlapped with the indwelling distal stent.  . There is no residual stenosis post intervention.       Right Coronary Artery  Dist RCA filled by collaterals from Mid Cx.   . Prox RCA to Mid RCA lesion, 100% stenosed. The lesion was previously treated with a stent (unknown type). Total occlusion   . Right Posterior Descending Artery   RPDA filled by collaterals from Mid Cx.     Assessment and Plan:  1. Multivessel CAD status post multiple interventions, most recently DES to the circumflex and LAD in October 2016 at which time RCA was occluded. He is now doing well without recurrent angina on medical therapy. No changes were made today. We will continue observation.  2. Hyperlipidemia, continues on TriCor and Crestor. Recent numbers reviewed.  3. Essential hypertension, blood pressure is well controlled today.  Current medicines were reviewed with the patient today.  Disposition: FU with me 6 months.   Signed, Satira Sark, MD, [facility] 02/18/2016 4:41 PM    [facility] at [facility] [address] [place], [place] [address] Phone: [phone]; Fax: [phone]

## 2016-02-18 NOTE — Patient Instructions (Signed)
Your physician wants you to follow-up in: 6 months You will receive a reminder letter in the mail two months in advance. If you don't receive a letter, please call our office to schedule the follow-up appointment.    Your physician recommends that you continue on your current medications as directed. Please refer to the Current Medication list given to you today.    If you need a refill on your cardiac medications before your next appointment, please call your pharmacy.     Thank you for choosing [facility] !        

## 2016-02-19 ENCOUNTER — Other Ambulatory Visit: Payer: Self-pay

## 2016-02-19 MED ORDER — ESOMEPRAZOLE MAGNESIUM 40 MG PO CPDR: 40.0000 mg | DELAYED_RELEASE_CAPSULE | Freq: Every day | ORAL | Status: DC

## 2016-02-19 NOTE — Telephone Encounter (Signed)
Refill completed.

## 2016-02-22 ENCOUNTER — Telehealth: Payer: Self-pay

## 2016-02-22 MED ORDER — OMEPRAZOLE 20 MG PO CPDR: 20.0000 mg | DELAYED_RELEASE_CAPSULE | Freq: Every day | ORAL | Status: DC

## 2016-02-22 NOTE — Telephone Encounter (Signed)
-----   Message from Satira Sark, MD sent at 02/22/2016  4:40 PM EDT ----- Regarding: RE: formulary change from nexium We can switch to omeprazole if he would like.  ----- Message -----    From: Bernita Raisin, RN    Sent: 02/22/2016   4:00 PM      To: Satira Sark, MD Subject: formulary change from nexium                   pts insurance will cover omeprazole

## 2016-02-22 NOTE — Telephone Encounter (Signed)
E-scribed Prilosec (omeprazole) for pt,pt aware

## 2016-03-03 ENCOUNTER — Other Ambulatory Visit: Payer: Self-pay

## 2016-03-03 MED ORDER — METOPROLOL SUCCINATE ER 25 MG PO TB24: 75.0000 mg | ORAL_TABLET | Freq: Every day | ORAL | Status: DC

## 2016-03-03 NOTE — Telephone Encounter (Signed)
Metoprolol refilled.

## 2016-03-18 DIAGNOSIS — Z6831 Body mass index (BMI) 31.0-31.9, adult: Secondary | ICD-10-CM | POA: Diagnosis not present

## 2016-03-18 DIAGNOSIS — E782 Mixed hyperlipidemia: Secondary | ICD-10-CM | POA: Diagnosis not present

## 2016-03-18 DIAGNOSIS — E119 Type 2 diabetes mellitus without complications: Secondary | ICD-10-CM | POA: Diagnosis not present

## 2016-03-18 DIAGNOSIS — I2510 Atherosclerotic heart disease of native coronary artery without angina pectoris: Secondary | ICD-10-CM | POA: Diagnosis not present

## 2016-03-18 DIAGNOSIS — I10 Essential (primary) hypertension: Secondary | ICD-10-CM | POA: Diagnosis not present

## 2016-03-18 DIAGNOSIS — Z1389 Encounter for screening for other disorder: Secondary | ICD-10-CM | POA: Diagnosis not present

## 2016-04-01 ENCOUNTER — Other Ambulatory Visit: Payer: Self-pay

## 2016-04-01 MED ORDER — PRASUGREL HCL 10 MG PO TABS: ORAL_TABLET | ORAL | Status: DC

## 2016-04-01 NOTE — Telephone Encounter (Signed)
Refill complete 

## 2016-05-06 DIAGNOSIS — E119 Type 2 diabetes mellitus without complications: Secondary | ICD-10-CM | POA: Diagnosis not present

## 2016-05-06 DIAGNOSIS — E782 Mixed hyperlipidemia: Secondary | ICD-10-CM | POA: Diagnosis not present

## 2016-05-06 DIAGNOSIS — Z1389 Encounter for screening for other disorder: Secondary | ICD-10-CM | POA: Diagnosis not present

## 2016-05-06 DIAGNOSIS — I10 Essential (primary) hypertension: Secondary | ICD-10-CM | POA: Diagnosis not present

## 2016-06-17 DIAGNOSIS — E119 Type 2 diabetes mellitus without complications: Secondary | ICD-10-CM | POA: Diagnosis not present

## 2016-06-17 DIAGNOSIS — E782 Mixed hyperlipidemia: Secondary | ICD-10-CM | POA: Diagnosis not present

## 2016-06-17 DIAGNOSIS — I10 Essential (primary) hypertension: Secondary | ICD-10-CM | POA: Diagnosis not present

## 2016-06-17 DIAGNOSIS — Z1389 Encounter for screening for other disorder: Secondary | ICD-10-CM | POA: Diagnosis not present

## 2016-06-17 DIAGNOSIS — Z6830 Body mass index (BMI) 30.0-30.9, adult: Secondary | ICD-10-CM | POA: Diagnosis not present

## 2016-07-10 ENCOUNTER — Other Ambulatory Visit: Payer: Self-pay | Admitting: Cardiology

## 2016-07-11 ENCOUNTER — Other Ambulatory Visit: Payer: Self-pay

## 2016-07-11 MED ORDER — VALSARTAN-HYDROCHLOROTHIAZIDE 320-25 MG PO TABS: 1.0000 | ORAL_TABLET | Freq: Every day | ORAL | 1 refills | Status: DC

## 2016-07-11 NOTE — Telephone Encounter (Signed)
Valsartan refilled.

## 2016-07-20 DIAGNOSIS — M722 Plantar fascial fibromatosis: Secondary | ICD-10-CM | POA: Diagnosis not present

## 2016-07-20 DIAGNOSIS — M7732 Calcaneal spur, left foot: Secondary | ICD-10-CM | POA: Diagnosis not present

## 2016-07-20 DIAGNOSIS — M71572 Other bursitis, not elsewhere classified, left ankle and foot: Secondary | ICD-10-CM | POA: Diagnosis not present

## 2016-07-20 DIAGNOSIS — M76822 Posterior tibial tendinitis, left leg: Secondary | ICD-10-CM | POA: Diagnosis not present

## 2016-10-26 ENCOUNTER — Other Ambulatory Visit: Payer: Self-pay

## 2016-10-26 MED ORDER — ROSUVASTATIN CALCIUM 20 MG PO TABS: 20.0000 mg | ORAL_TABLET | Freq: Every day | ORAL | 3 refills | Status: DC

## 2016-12-21 ENCOUNTER — Other Ambulatory Visit: Payer: Self-pay | Admitting: Cardiology

## 2017-01-06 DIAGNOSIS — Z6832 Body mass index (BMI) 32.0-32.9, adult: Secondary | ICD-10-CM | POA: Diagnosis not present

## 2017-01-06 DIAGNOSIS — E1165 Type 2 diabetes mellitus with hyperglycemia: Secondary | ICD-10-CM | POA: Diagnosis not present

## 2017-01-06 DIAGNOSIS — I2510 Atherosclerotic heart disease of native coronary artery without angina pectoris: Secondary | ICD-10-CM | POA: Diagnosis not present

## 2017-01-06 DIAGNOSIS — Z1389 Encounter for screening for other disorder: Secondary | ICD-10-CM | POA: Diagnosis not present

## 2017-01-06 DIAGNOSIS — E6609 Other obesity due to excess calories: Secondary | ICD-10-CM | POA: Diagnosis not present

## 2017-01-11 DIAGNOSIS — E119 Type 2 diabetes mellitus without complications: Secondary | ICD-10-CM | POA: Diagnosis not present

## 2017-01-17 DIAGNOSIS — E119 Type 2 diabetes mellitus without complications: Secondary | ICD-10-CM | POA: Diagnosis not present

## 2017-01-30 ENCOUNTER — Other Ambulatory Visit: Payer: Self-pay | Admitting: Adult Health

## 2017-01-30 ENCOUNTER — Other Ambulatory Visit: Payer: Self-pay | Admitting: Cardiology

## 2017-02-02 ENCOUNTER — Other Ambulatory Visit: Payer: Self-pay | Admitting: *Deleted

## 2017-02-02 ENCOUNTER — Telehealth: Payer: Self-pay | Admitting: Cardiology

## 2017-02-02 MED ORDER — OMEPRAZOLE 20 MG PO CPDR: 20.0000 mg | DELAYED_RELEASE_CAPSULE | Freq: Every day | ORAL | 0 refills | Status: DC

## 2017-02-02 NOTE — Telephone Encounter (Signed)
Please send refill on Omeprazole to [facility] / [facility]g

## 2017-02-02 NOTE — Telephone Encounter (Signed)
Patient needs appt for further refills. Refill sent for 30 day supply.

## 2017-03-13 ENCOUNTER — Other Ambulatory Visit: Payer: Self-pay | Admitting: Cardiology

## 2017-03-26 ENCOUNTER — Other Ambulatory Visit: Payer: Self-pay | Admitting: Cardiology

## 2017-03-30 NOTE — Progress Notes (Signed)
Cardiology Office Note  Date: 03/31/2017   ID: [name], DOB 1961/01/15, MRN [id number]  PCP: [facility]  Primary Cardiologist: Rozann Lesches, MD   Chief Complaint  Patient presents with  . Coronary Artery Disease    History of Present Illness: [name] is a 56 y.o. male last seen in March 2017. He presents for a routine follow-up visit. Reports rare angina symptoms on medical therapy, overall satisfied with how he has been doing. Continues to work full-time.  Most recent cardiac catheterization in October 2016 showed total occlusion of the RCA with new severe stenosis of the circumflex and severe distal edge stent restenosis of the LAD. He underwent placement of DES to the circumflex and LAD at that time. LVEF was 40-45%.  I personally reviewed his ECG today which shows sinus rhythm with possible old anterior infarct pattern, inferior Q waves.  Current medications are outlined below. Cardiac regimen is unchanged. I recommended that he follow-up with his PCP for a physical with lab work including lipids. He is tolerating Crestor.  Past Medical History:  Diagnosis Date  . Coronary atherosclerosis of native coronary artery    a. remote LAD stent. b. subsequent stents to RCA with restenosis. c. DES to RCA and Circ 06/2007 in setting of restenosis. d. DES to LAD & RCA in 01/2010. e. DES to Cx and LAD; total occlusion of RCA, LVEF 40-45%.  . Diabetes mellitus type 2, diet-controlled ([place])   . Essential hypertension   . Hepatic steatosis   . Hyperlipidemia   . Ischemic cardiomyopathy   . MI (myocardial infarction) ([place])    Anterior  . Obstructive sleep apnea   . Thrombocytopenia ([place])     Past Surgical History:  Procedure Laterality Date  . CARDIAC CATHETERIZATION N/A 09/17/2015   Procedure: Left Heart Cath and Coronary Angiography;  Surgeon: [name], MD;  Location: [facility];  Service: Cardiovascular;  Laterality: N/A;  .  CORONARY ANGIOPLASTY WITH STENT PLACEMENT    . WISDOM TOOTH EXTRACTION      Current Outpatient Prescriptions  Medication Sig Dispense Refill  . aspirin 81 MG tablet Take 1 tablet (81 mg total) by mouth daily. 90 tablet 3  . fenofibrate (TRICOR) 48 MG tablet TAKE 1 TABLET DAILY 90 tablet 3  . isosorbide mononitrate (IMDUR) 30 MG 24 hr tablet TAKE 1 TABLET DAILY 90 tablet 3  . MAGNESIUM GLUCONATE PO Take 100 mg by mouth at bedtime.    . metoprolol succinate (TOPROL-XL) 25 MG 24 hr tablet TAKE 3 BY MOUTH DAILY -    TAKE 1 BY MOUTH IN THE     MORNING AND 2 BY MOUTH IN  THE EVENING 270 tablet 1  . Multiple Vitamin (MULTIVITAMIN) tablet Take 1 tablet by mouth daily.      . nitroGLYCERIN (NITROSTAT) 0.4 MG SL tablet Place 1 tablet (0.4 mg total) under the tongue every 5 (five) minutes as needed. For chest pain 25 tablet 6  . Omega-3 Fatty Acids (FISH OIL) 1000 MG CAPS Take 2 capsules by mouth 2 (two) times daily.      [name] omeprazole (PRILOSEC) 20 MG capsule Take 1 capsule (20 mg total) by mouth daily. 30 capsule 0  . omeprazole (PRILOSEC) 20 MG capsule TAKE 1 CAPSULE DAILY.      PATIENT NEEDS APPOINTMENT  FOR FURTHER REFILLS 90 capsule 1  . prasugrel (EFFIENT) 10 MG TABS tablet TAKE 1 TABLET ONCE DAILY 90 tablet 3  . rosuvastatin (  CRESTOR) 20 MG tablet Take 1 tablet (20 mg total) by mouth at bedtime. 90 tablet 3  . valsartan-hydrochlorothiazide (DIOVAN-HCT) 320-25 MG tablet Take 1 tablet by mouth daily. 90 tablet 1  . valsartan-hydrochlorothiazide (DIOVAN-HCT) 320-25 MG tablet TAKE 1 TABLET DAILY 90 tablet 1   No current facility-administered medications for this visit.    Allergies:  Reopro [abciximab]   Social History: The patient  reports that he quit smoking about 16 years ago. His smoking use included Cigarettes. He started smoking about 39 years ago. He has never used smokeless tobacco. He reports that he does not drink alcohol or use drugs.   ROS:  Please see the history of present illness.  Otherwise, complete review of systems is positive for none.  All other systems are reviewed and negative.   Physical Exam: VS:  BP 122/82   Pulse 84   Ht 6\' 1"  (1.854 m)   Wt 231 lb (104.8 kg)   SpO2 97%   BMI 30.48 kg/m , BMI Body mass index is 30.48 kg/m.  Wt Readings from Last 3 Encounters:  03/31/17 231 lb (104.8 kg)  02/18/16 231 lb (104.8 kg)  09/28/15 223 lb (101.2 kg)    Overweight male, appears comfortable at rest.  HEENT: Conjunctiva and lids normal, oropharynx clear.  Neck: Supple, no elevated jugular venous pressure.  Lungs: Clear to auscultation, nonlabored.  Cardiac: Regular rate and rhythm, no S3 gallop, no loud systolic murmur. PMI indistinct.  Abdomen: Soft, nontender.  Extremities: No pitting edema, distal pulses one plus. Skin: Warm and dry. Musculoskeletal: No kyphosis. Neuropsychiatric: Alert and oriented 3, affect appropriate.  ECG: I personally reviewed the tracing from 09/17/2015 which showed sinus rhythm with leftward axis and old inferior infarct pattern, poor R-wave progression.  Recent Labwork:     Component Value Date/Time   CHOL 130 10/03/2015 0803   TRIG 97 10/03/2015 0803   HDL 30 (L) 10/03/2015 0803   CHOLHDL 4.3 10/03/2015 0803   VLDL 19 10/03/2015 0803   LDLCALC 81 10/03/2015 0803   LDLDIRECT 95.5 10/22/2007 0851  April 2017: Hemoglobin A1c 6.0  Other Studies Reviewed Today:  Chest x-ray 09/14/2015: FINDINGS: There is no edema or consolidation. Heart size and pulmonary vascularity are normal. No adenopathy. There is an apparent stent in a coronary artery, better seen on the frontal view. No bone lesions. No pneumothorax.  IMPRESSION: No edema or consolidation  Assessment and Plan:  1. Symptomatically stable CAD status post several percutaneous interventions, most recently DES to the circumflex and LAD with total occlusion of the RCA back in 2016. Plan to continue medical therapy and observation. ECG reviewed.  2.  Hyperlipidemia, tolerating Crestor. Recommended follow-up lipid panel with PCP.  3. Ischemic cardiomyopathy, LVEF approximately 45% as of 2016. No evidence of fluid overload, tolerating medical therapy well. We will consider repeat echocardiogram around his next visit.  4. Essential hypertension, blood pressure is well controlled today.  Current medicines were reviewed with the patient today.   Orders Placed This Encounter  Procedures  . EKG 12-Lead    Disposition: Follow-up in 6 months.  Signed, Satira Sark, MD, [facility] 03/31/2017 4:16 PM    [place] at [facility] [address], [place], [place] [address] Phone: [phone]; Fax: [phone]

## 2017-03-31 ENCOUNTER — Encounter: Payer: Self-pay | Admitting: Cardiology

## 2017-03-31 ENCOUNTER — Ambulatory Visit (INDEPENDENT_AMBULATORY_CARE_PROVIDER_SITE_OTHER): Payer: BLUE CROSS/BLUE SHIELD | Admitting: Cardiology

## 2017-03-31 VITALS — BP 122/82 | HR 84 | Ht 73.0 in | Wt 231.0 lb

## 2017-03-31 DIAGNOSIS — E782 Mixed hyperlipidemia: Secondary | ICD-10-CM

## 2017-03-31 DIAGNOSIS — I10 Essential (primary) hypertension: Secondary | ICD-10-CM

## 2017-03-31 DIAGNOSIS — I255 Ischemic cardiomyopathy: Secondary | ICD-10-CM | POA: Diagnosis not present

## 2017-03-31 DIAGNOSIS — I25119 Atherosclerotic heart disease of native coronary artery with unspecified angina pectoris: Secondary | ICD-10-CM

## 2017-03-31 NOTE — Patient Instructions (Signed)

## 2017-04-21 DIAGNOSIS — E6609 Other obesity due to excess calories: Secondary | ICD-10-CM | POA: Diagnosis not present

## 2017-04-21 DIAGNOSIS — R21 Rash and other nonspecific skin eruption: Secondary | ICD-10-CM | POA: Diagnosis not present

## 2017-04-21 DIAGNOSIS — Z6831 Body mass index (BMI) 31.0-31.9, adult: Secondary | ICD-10-CM | POA: Diagnosis not present

## 2017-04-21 DIAGNOSIS — Z1389 Encounter for screening for other disorder: Secondary | ICD-10-CM | POA: Diagnosis not present

## 2017-04-21 DIAGNOSIS — E782 Mixed hyperlipidemia: Secondary | ICD-10-CM | POA: Diagnosis not present

## 2017-04-21 DIAGNOSIS — J029 Acute pharyngitis, unspecified: Secondary | ICD-10-CM | POA: Diagnosis not present

## 2017-04-21 DIAGNOSIS — I10 Essential (primary) hypertension: Secondary | ICD-10-CM | POA: Diagnosis not present

## 2017-05-12 DIAGNOSIS — E782 Mixed hyperlipidemia: Secondary | ICD-10-CM | POA: Diagnosis not present

## 2017-05-12 DIAGNOSIS — I10 Essential (primary) hypertension: Secondary | ICD-10-CM | POA: Diagnosis not present

## 2017-05-12 DIAGNOSIS — E1165 Type 2 diabetes mellitus with hyperglycemia: Secondary | ICD-10-CM | POA: Diagnosis not present

## 2017-07-02 ENCOUNTER — Other Ambulatory Visit: Payer: Self-pay | Admitting: Cardiology

## 2017-08-09 ENCOUNTER — Telehealth: Payer: Self-pay | Admitting: Cardiology

## 2017-08-09 ENCOUNTER — Other Ambulatory Visit: Payer: Self-pay

## 2017-08-09 MED ORDER — ISOSORBIDE MONONITRATE ER 30 MG PO TB24: 30.0000 mg | ORAL_TABLET | Freq: Every day | ORAL | 3 refills | Status: DC

## 2017-08-09 NOTE — Progress Notes (Unsigned)
Per pt phone call- imdur 30 mg daily was sent to [place].

## 2017-08-09 NOTE — Telephone Encounter (Signed)
Done
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
## 2017-09-10 ENCOUNTER — Other Ambulatory Visit: Payer: Self-pay | Admitting: Cardiology

## 2017-10-02 ENCOUNTER — Encounter: Payer: Self-pay | Admitting: Cardiology

## 2017-10-02 ENCOUNTER — Ambulatory Visit (INDEPENDENT_AMBULATORY_CARE_PROVIDER_SITE_OTHER): Payer: BLUE CROSS/BLUE SHIELD | Admitting: Cardiology

## 2017-10-02 VITALS — BP 122/84 | HR 84 | Ht 73.0 in | Wt 230.0 lb

## 2017-10-02 DIAGNOSIS — I255 Ischemic cardiomyopathy: Secondary | ICD-10-CM | POA: Diagnosis not present

## 2017-10-02 DIAGNOSIS — E782 Mixed hyperlipidemia: Secondary | ICD-10-CM | POA: Diagnosis not present

## 2017-10-02 DIAGNOSIS — I25119 Atherosclerotic heart disease of native coronary artery with unspecified angina pectoris: Secondary | ICD-10-CM

## 2017-10-02 DIAGNOSIS — I10 Essential (primary) hypertension: Secondary | ICD-10-CM

## 2017-10-02 NOTE — Patient Instructions (Signed)
Your physician wants you to follow-up in:6 months with Dr.McDowell You will receive a reminder letter in the mail two months in advance. If you don't receive a letter, please call our office to schedule the follow-up appointment.   Your physician recommends that you continue on your current medications as directed. Please refer to the Current Medication list given to you today.   If you need a refill on your cardiac medications before your next appointment, please call your pharmacy.    No lab work or tests ordered today.      Thank you for choosing [facility] !         

## 2017-10-02 NOTE — Progress Notes (Signed)
Cardiology Office Note  Date: 10/02/2017   ID: [name], DOB [birth year], MRN [id number]  PCP: [facility]  Primary Cardiologist: Rozann Lesches, MD   Chief Complaint  Patient presents with  . Coronary Artery Disease    History of Present Illness: [name] is a 56 y.o. male last seen in May.  He presents for a routine follow-up visit.  Reports stable exertional angina, not requiring nitroglycerin.  We reviewed his medications which are outlined below.  He does relatively well with most activities.  Occasionally when he forgets his morning medications he has more symptoms.  Most recent cardiac catheterization in October 2016 showed total occlusion of the RCA with new severe stenosis of the circumflex and severe distal edge stent restenosis of the LAD. He underwent placement of DES to the circumflex and LAD at that time. LVEF was 40-45%.  Current cardiac regimen includes aspirin, TriCor, Imdur, Toprol-XL, Crestor, Effient, Diovan HCTZ, and as needed nitroglycerin.  Past Medical History:  Diagnosis Date  . Coronary atherosclerosis of native coronary artery    a. remote LAD stent. b. subsequent stents to RCA with restenosis. c. DES to RCA and Circ 06/2007 in setting of restenosis. d. DES to LAD & RCA in 01/2010. e. DES to Cx and LAD; total occlusion of RCA, LVEF 40-45%.  . Diabetes mellitus type 2, diet-controlled ([place])   . Essential hypertension   . Hepatic steatosis   . Hyperlipidemia   . Ischemic cardiomyopathy   . MI (myocardial infarction) ([place])    Anterior  . Obstructive sleep apnea   . Thrombocytopenia ([place])     Past Surgical History:  Procedure Laterality Date  . CORONARY ANGIOPLASTY WITH STENT PLACEMENT    . WISDOM TOOTH EXTRACTION      Current Outpatient Medications  Medication Sig Dispense Refill  . aspirin 81 MG tablet Take 1 tablet (81 mg total) by mouth daily. 90 tablet 3  . fenofibrate (TRICOR) 48 MG tablet TAKE 1 TABLET DAILY  90 tablet 3  . isosorbide mononitrate (IMDUR) 30 MG 24 hr tablet Take 1 tablet (30 mg total) by mouth daily. 90 tablet 3  . MAGNESIUM GLUCONATE PO Take 100 mg by mouth at bedtime.    . metoprolol succinate (TOPROL-XL) 25 MG 24 hr tablet TAKE 3 BY MOUTH DAILY -    TAKE 1 BY MOUTH IN THE     MORNING AND 2 BY MOUTH IN  THE EVENING 270 tablet 1  . Multiple Vitamin (MULTIVITAMIN) tablet Take 1 tablet by mouth daily.      . nitroGLYCERIN (NITROSTAT) 0.4 MG SL tablet Place 1 tablet (0.4 mg total) under the tongue every 5 (five) minutes as needed. For chest pain 25 tablet 6  . Omega-3 Fatty Acids (FISH OIL) 1000 MG CAPS Take 2 capsules by mouth 2 (two) times daily.      [name] omeprazole (PRILOSEC) 20 MG capsule Take 1 capsule (20 mg total) by mouth daily. 30 capsule 0  . prasugrel (EFFIENT) 10 MG TABS tablet TAKE 1 TABLET ONCE DAILY 90 tablet 3  . rosuvastatin (CRESTOR) 20 MG tablet Take 1 tablet (20 mg total) by mouth at bedtime. 90 tablet 3  . valsartan-hydrochlorothiazide (DIOVAN-HCT) 320-25 MG tablet Take 1 tablet by mouth daily. 90 tablet 1   No current facility-administered medications for this visit.    Allergies:  Reopro [abciximab]   Social History: The patient  reports that he quit smoking about 17 years ago. His smoking use  included cigarettes. He started smoking about 39 years ago. he has never used smokeless tobacco. He reports that he does not drink alcohol or use drugs.   ROS:  Please see the history of present illness. Otherwise, complete review of systems is positive for none.  All other systems are reviewed and negative.   Physical Exam: VS:  BP 122/84   Pulse 84   Ht 6\' 1"  (1.854 m)   Wt 230 lb (104.3 kg)   SpO2 98%   BMI 30.34 kg/m , BMI Body mass index is 30.34 kg/m.  Wt Readings from Last 3 Encounters:  10/02/17 230 lb (104.3 kg)  03/31/17 231 lb (104.8 kg)  02/18/16 231 lb (104.8 kg)    General: Overweight male, appears comfortable at rest. HEENT: Conjunctiva and lids  normal, oropharynx clear. Neck: Supple, no elevated JVP or carotid bruits, no thyromegaly. Lungs: Clear to auscultation, nonlabored breathing at rest. Cardiac: Regular rate and rhythm, no S3 or significant systolic murmur, no pericardial rub. Abdomen: Soft, nontender, bowel sounds present, no guarding or rebound. Extremities: No pitting edema, distal pulses 2+. Skin: Warm and dry. Musculoskeletal: No kyphosis. Neuropsychiatric: Alert and oriented x3, affect grossly appropriate.  ECG: I personally reviewed the tracing from 03/31/2017 which showed sinus rhythm with old anterior infarct pattern.  Recent Labwork:  April 2017: Hemoglobin A1c 6.0  Assessment and Plan:  1.  CAD with stable angina status post multiple percutaneous coronary interventions, most recently DES to the circumflex and LAD with total occlusion of the RCA as of 2016.  Continue current regimen unless symptoms escalate.  He does have some room for up titration of therapy.  2.  Mixed hyperlipidemia, continues on Crestor.  3.  Ischemic cardiomyopathy with LVEF approximately 45%.  Weights are stable.  No evidence of fluid overload.  4.  Essential hypertension, blood pressure is well controlled today.  Current medicines were reviewed with the patient today.  Disposition: Follow-up in 6 months.  Signed, Satira Sark, MD, [facility] 10/02/2017 4:45 PM    [facility] at [facility] [address], [place], [place] [address] Phone: [phone]; Fax: [phone]

## 2017-10-06 ENCOUNTER — Other Ambulatory Visit: Payer: Self-pay | Admitting: Cardiology

## 2017-10-06 MED ORDER — VALSARTAN-HYDROCHLOROTHIAZIDE 320-25 MG PO TABS: 1.0000 | ORAL_TABLET | Freq: Every day | ORAL | 1 refills | Status: DC

## 2017-10-06 MED ORDER — VALSARTAN-HYDROCHLOROTHIAZIDE 320-25 MG PO TABS: 1.0000 | ORAL_TABLET | Freq: Every day | ORAL | 2 refills | Status: DC

## 2017-10-06 NOTE — Telephone Encounter (Signed)
Refills to [facility] done

## 2017-10-06 NOTE — Telephone Encounter (Signed)
Patient needs 30 day supply of Valsartin sent to [facility] and then 90 day supple sent to [facility] / [facility]

## 2017-11-10 DIAGNOSIS — E1165 Type 2 diabetes mellitus with hyperglycemia: Secondary | ICD-10-CM | POA: Diagnosis not present

## 2017-11-10 DIAGNOSIS — E782 Mixed hyperlipidemia: Secondary | ICD-10-CM | POA: Diagnosis not present

## 2017-11-10 DIAGNOSIS — I10 Essential (primary) hypertension: Secondary | ICD-10-CM | POA: Diagnosis not present

## 2017-11-16 DIAGNOSIS — E1165 Type 2 diabetes mellitus with hyperglycemia: Secondary | ICD-10-CM | POA: Diagnosis not present

## 2017-11-16 DIAGNOSIS — R319 Hematuria, unspecified: Secondary | ICD-10-CM | POA: Diagnosis not present

## 2017-11-29 DIAGNOSIS — R05 Cough: Secondary | ICD-10-CM | POA: Diagnosis not present

## 2017-11-29 DIAGNOSIS — R319 Hematuria, unspecified: Secondary | ICD-10-CM | POA: Diagnosis not present

## 2017-12-01 ENCOUNTER — Other Ambulatory Visit (HOSPITAL_COMMUNITY): Payer: Self-pay | Admitting: Internal Medicine

## 2017-12-01 DIAGNOSIS — R319 Hematuria, unspecified: Secondary | ICD-10-CM

## 2017-12-01 DIAGNOSIS — N200 Calculus of kidney: Secondary | ICD-10-CM

## 2017-12-15 ENCOUNTER — Encounter (HOSPITAL_COMMUNITY): Payer: Self-pay

## 2017-12-15 ENCOUNTER — Ambulatory Visit (HOSPITAL_COMMUNITY)
Admission: RE | Admit: 2017-12-15 | Discharge: 2017-12-15 | Disposition: A | Discharge status: Home / Self Care | Payer: BLUE CROSS/BLUE SHIELD | Source: Ambulatory Visit | Attending: Internal Medicine | Admitting: Internal Medicine

## 2017-12-15 DIAGNOSIS — N200 Calculus of kidney: Secondary | ICD-10-CM

## 2017-12-15 DIAGNOSIS — R319 Hematuria, unspecified: Secondary | ICD-10-CM

## 2017-12-15 DIAGNOSIS — K760 Fatty (change of) liver, not elsewhere classified: Secondary | ICD-10-CM | POA: Diagnosis not present

## 2017-12-15 DIAGNOSIS — I700 Atherosclerosis of aorta: Secondary | ICD-10-CM | POA: Insufficient documentation

## 2017-12-15 LAB — POCT I-STAT CREATININE: Creatinine, Ser: 0.9 mg/dL (ref 0.61–1.24)

## 2017-12-15 MED ORDER — IOPAMIDOL (ISOVUE-300) INJECTION 61%
125.0000 mL | Freq: Once | INTRAVENOUS | Status: AC | PRN
  Administered 2017-12-15: 125 mL via INTRAVENOUS

## 2018-01-05 ENCOUNTER — Encounter: Payer: Self-pay | Admitting: Gastroenterology

## 2018-01-30 DIAGNOSIS — R311 Benign essential microscopic hematuria: Secondary | ICD-10-CM | POA: Diagnosis not present

## 2018-02-01 ENCOUNTER — Other Ambulatory Visit: Payer: Self-pay

## 2018-02-01 MED ORDER — ISOSORBIDE MONONITRATE ER 30 MG PO TB24: 30.0000 mg | ORAL_TABLET | Freq: Every day | ORAL | 3 refills | Status: DC

## 2018-02-01 NOTE — Telephone Encounter (Signed)
[facility]

## 2018-02-19 ENCOUNTER — Other Ambulatory Visit: Payer: Self-pay

## 2018-02-19 MED ORDER — ROSUVASTATIN CALCIUM 20 MG PO TABS: 20.0000 mg | ORAL_TABLET | Freq: Every day | ORAL | 3 refills | Status: DC

## 2018-02-19 NOTE — Telephone Encounter (Signed)
Refilled crestor per fax request 

## 2018-02-23 ENCOUNTER — Ambulatory Visit: Payer: BLUE CROSS/BLUE SHIELD | Admitting: Gastroenterology

## 2018-02-24 DIAGNOSIS — E1165 Type 2 diabetes mellitus with hyperglycemia: Secondary | ICD-10-CM | POA: Diagnosis not present

## 2018-02-24 DIAGNOSIS — R05 Cough: Secondary | ICD-10-CM | POA: Diagnosis not present

## 2018-02-24 DIAGNOSIS — E782 Mixed hyperlipidemia: Secondary | ICD-10-CM | POA: Diagnosis not present

## 2018-02-24 DIAGNOSIS — I10 Essential (primary) hypertension: Secondary | ICD-10-CM | POA: Diagnosis not present

## 2018-03-01 DIAGNOSIS — E782 Mixed hyperlipidemia: Secondary | ICD-10-CM | POA: Diagnosis not present

## 2018-03-01 DIAGNOSIS — Z6833 Body mass index (BMI) 33.0-33.9, adult: Secondary | ICD-10-CM | POA: Diagnosis not present

## 2018-03-01 DIAGNOSIS — R319 Hematuria, unspecified: Secondary | ICD-10-CM | POA: Diagnosis not present

## 2018-03-01 DIAGNOSIS — R945 Abnormal results of liver function studies: Secondary | ICD-10-CM | POA: Diagnosis not present

## 2018-03-06 ENCOUNTER — Other Ambulatory Visit: Payer: Self-pay | Admitting: Cardiology

## 2018-03-06 MED ORDER — OMEPRAZOLE 20 MG PO CPDR: 20.0000 mg | DELAYED_RELEASE_CAPSULE | Freq: Every day | ORAL | 1 refills | Status: DC

## 2018-03-06 MED ORDER — METOPROLOL SUCCINATE ER 25 MG PO TB24: ORAL_TABLET | ORAL | 1 refills | Status: DC

## 2018-03-06 NOTE — Telephone Encounter (Signed)
Refilled rx via mail order as requested

## 2018-03-06 NOTE — Telephone Encounter (Signed)
Please send refills for   omeprazole (PRILOSEC) 20 MG capsule [[id number]]     metoprolol succinate (TOPROL-XL) 25 MG 24 hr tablet [[id number]]   [facility]  Scheduled in June to see [facility]

## 2018-03-13 DIAGNOSIS — E1165 Type 2 diabetes mellitus with hyperglycemia: Secondary | ICD-10-CM | POA: Diagnosis not present

## 2018-03-13 DIAGNOSIS — Z6832 Body mass index (BMI) 32.0-32.9, adult: Secondary | ICD-10-CM | POA: Diagnosis not present

## 2018-03-22 ENCOUNTER — Encounter: Payer: Self-pay | Admitting: Gastroenterology

## 2018-03-22 ENCOUNTER — Telehealth: Payer: Self-pay

## 2018-03-22 ENCOUNTER — Encounter: Payer: Self-pay | Admitting: *Deleted

## 2018-03-22 ENCOUNTER — Other Ambulatory Visit: Payer: Self-pay | Admitting: *Deleted

## 2018-03-22 ENCOUNTER — Ambulatory Visit (INDEPENDENT_AMBULATORY_CARE_PROVIDER_SITE_OTHER): Payer: BLUE CROSS/BLUE SHIELD | Admitting: Gastroenterology

## 2018-03-22 VITALS — BP 124/80 | HR 79 | Temp 97.4°F | Ht 73.0 in | Wt 233.8 lb

## 2018-03-22 DIAGNOSIS — K760 Fatty (change of) liver, not elsewhere classified: Secondary | ICD-10-CM | POA: Diagnosis not present

## 2018-03-22 DIAGNOSIS — Z1211 Encounter for screening for malignant neoplasm of colon: Secondary | ICD-10-CM | POA: Diagnosis not present

## 2018-03-22 DIAGNOSIS — K219 Gastro-esophageal reflux disease without esophagitis: Secondary | ICD-10-CM

## 2018-03-22 LAB — IRON,TIBC AND FERRITIN PANEL
%SAT: 21 % (calc) (ref 15–60)
Ferritin: 299 ng/mL (ref 20–380)
Iron: 84 ug/dL (ref 50–180)
TIBC: 404 mcg/dL (calc) (ref 250–425)

## 2018-03-22 MED ORDER — PEG 3350-KCL-NA BICARB-NACL 420 G PO SOLR: 4000.0000 mL | Freq: Once | ORAL | 0 refills | Status: AC

## 2018-03-22 NOTE — Progress Notes (Addendum)
REVIEWED-NO ADDITIONAL RECOMMENDATIONS.  Primary Care Physician:  Celene Squibb, MD Primary Gastroenterologist:  Dr. Oneida Alar   Chief Complaint  Patient presents with  . fatty liver    HPI:   [name] is a 57 y.o. male presenting today at the request of Dr. Nevada Crane secondary to fatty liver.    Imaging with CT abd/pelvis w/wo contrast Jan 2019 with stable severe hepatic steatosis. Spleen normal. Outside labs from Dec 2018 with Hgb 15.8, Hct 44.4, platelets 150, BUN 16, Cr 0.93, Albumin 4.7, Tbili 0.5, Alk Phos 49, AST 36, ALT 60, triglycerides 213, HDL 33, LDL 97,   Vague LUQ discomfort sometimes out of the blue. No precipitating or aggravating factors. Sometimes there then gone. Not associated with eating. Noted as dull, may hang around for a day then will be weeks before it returns. A few times has been very painful. Present less than 4 months. No N/V. Rare heartburn. No dysphagia. Occasional NSAIDs but try to take tylenol. Takes Omeprazole daily.   No changes in bowel habits. Rare straining at times. Will note tissue hematochezia with straining. No prior colonoscopy. No prior EGD.   MI X 2 in 2001, used to be on Plavix for several years. Was switched to Effient a few years ago. Last catheterization 2016.    Past Medical History:  Diagnosis Date  . Coronary atherosclerosis of native coronary artery    a. remote LAD stent. b. subsequent stents to RCA with restenosis. c. DES to RCA and Circ 06/2007 in setting of restenosis. d. DES to LAD & RCA in 01/2010. e. DES to Cx and LAD; total occlusion of RCA, LVEF 40-45%.  . Diabetes mellitus type 2, diet-controlled ([place])   . Essential hypertension   . Hepatic steatosis   . Hyperlipidemia   . Ischemic cardiomyopathy   . MI (myocardial infarction) ([place])    Anterior  . Obstructive sleep apnea   . Thrombocytopenia ([place])    patient denies    Past Surgical History:  Procedure Laterality Date  . CARDIAC CATHETERIZATION N/A 09/17/2015   Procedure: Left Heart Cath and Coronary Angiography;  Surgeon: [name], MD;  Location: [facility];  Service: Cardiovascular;  Laterality: N/A;  . CORONARY ANGIOPLASTY WITH STENT PLACEMENT    . left carpal tunnel    . ROTATOR CUFF REPAIR    . WISDOM TOOTH EXTRACTION      Current Outpatient Medications  Medication Sig Dispense Refill  . aspirin 81 MG tablet Take 1 tablet (81 mg total) by mouth daily. 90 tablet 3  . Coenzyme Q10 (COQ-10) 200 MG CAPS Take 1 capsule by mouth daily.    . dapagliflozin propanediol (FARXIGA) 10 MG TABS tablet Take 10 mg by mouth daily.    . fenofibrate 160 MG tablet Take 160 mg by mouth daily.    . isosorbide mononitrate (IMDUR) 30 MG 24 hr tablet Take 1 tablet (30 mg total) by mouth daily. 90 tablet 3  . MAGNESIUM GLUCONATE PO Take 100 mg by mouth 2 (two) times daily.     . metoprolol succinate (TOPROL-XL) 25 MG 24 hr tablet TAKE 3 BY MOUTH DAILY -    TAKE 1 BY MOUTH IN THE     MORNING AND 2 BY MOUTH IN  THE EVENING (Patient taking differently: TAKE 3 BY MOUTH DAILY) 270 tablet 1  . Multiple Vitamin (MULTIVITAMIN) tablet Take 1 tablet by mouth daily.      . nitroGLYCERIN (NITROSTAT) 0.4 MG SL tablet Place 1  tablet (0.4 mg total) under the tongue every 5 (five) minutes as needed. For chest pain 25 tablet 6  . Omega-3 Fatty Acids (FISH OIL) 1000 MG CAPS Take 2 capsules by mouth 2 (two) times daily.      [name] omeprazole (PRILOSEC) 20 MG capsule Take 1 capsule (20 mg total) by mouth daily. 90 capsule 1  . prasugrel (EFFIENT) 10 MG TABS tablet TAKE 1 TABLET ONCE DAILY 90 tablet 3  . rosuvastatin (CRESTOR) 20 MG tablet Take 1 tablet (20 mg total) by mouth at bedtime. 90 tablet 3  . SitaGLIPtin-MetFORMIN HCl [id number] MG TB24 Take 1 tablet by mouth daily.    . valsartan-hydrochlorothiazide (DIOVAN-HCT) 320-25 MG tablet Take 1 tablet daily by mouth. 90 tablet 2   No current facility-administered medications for this visit.     Allergies as of 03/22/2018 -  Review Complete 03/22/2018  Allergen Reaction Noted  . Reopro [abciximab] Other (See Comments) 11/09/2011    Family History  Problem Relation Age of Onset  . Coronary artery disease Unknown   . Hypertension Mother   . Diabetes Mother   . Heart attack Mother   . Diabetes Father   . CVA Father   . Diabetes Brother   . Hypertension Brother   . Colon cancer Neg Hx   . Colon polyps Neg Hx     Social History   Socioeconomic History  . Marital status: Married    Spouse name: Not on file  . Number of children: Not on file  . Years of education: Not on file  . Highest education level: Not on file  Occupational History  . 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 Financial resource strain: Not on file  . Food insecurity:    Worry: Not on file    Inability: Not on file  . Transportation needs:    Medical: Not on file    Non-medical: Not on file  Tobacco Use  . Smoking status: Former Smoker    Types: Cigarettes    Start date: 11/21/1977    Last attempt to quit: 08/04/2000    Years since quitting: 17.6  . Smokeless tobacco: Never Used  . Tobacco comment: Tobacco - no  Substance and Sexual Activity  . Alcohol use: No    Alcohol/week: 0.0 oz    Comment: rare, maybe 1 beer once a month   . Drug use: No  . Sexual activity: Not on file  Lifestyle  . Physical activity:    Days per week: Not on file    Minutes per session: Not on file  . Stress: Not on file  Relationships  . Social connections:    Talks on phone: Not on file    Gets together: Not on file    Attends religious service: Not on file    Active member of club or organization: Not on file    Attends meetings of clubs or organizations: Not on file    Relationship status: Not on file  . Intimate partner violence:    Fear of current or ex partner: Not on file    Emotionally abused: Not on file    Physically abused: Not on file    Forced sexual activity: Not on file  Other Topics  Concern  . Not on file  Social History Narrative  . Not on file    Review of Systems: Gen: Denies any fever, chills, fatigue, weight loss, lack of appetite.  CV: Denies chest  pain, heart palpitations, peripheral edema, syncope.  Resp: Denies shortness of breath at rest or with exertion. Denies wheezing or cough.  GI: see HPI  GU : Denies urinary burning, urinary frequency, urinary hesitancy MS: Denies joint pain, muscle weakness, cramps, or limitation of movement.  Derm: Denies rash, itching, dry skin Psych: Denies depression, anxiety, memory loss, and confusion Heme: Denies bruising, bleeding, and enlarged lymph nodes.  Physical Exam: BP 124/80   Pulse 79   Temp (!) 97.4 F (36.3 C) (Oral)   Ht '6\' 1"'  (1.854 m)   Wt 233 lb 12.8 oz (106.1 kg)   BMI 30.85 kg/m  General:   Alert and oriented. Pleasant and cooperative. Well-nourished and well-developed.  Head:  Normocephalic and atraumatic. Eyes:  Without icterus, sclera clear and conjunctiva pink.  Ears:  Normal auditory acuity. Nose:  No deformity, discharge,  or lesions. Mouth:  No deformity or lesions, oral mucosa pink.  Lungs:  Clear to auscultation bilaterally. No wheezes, rales, or rhonchi. No distress.  Heart:  S1, S2 present without murmurs appreciated.  Abdomen:  +BS, soft, non-tender and non-distended. No HSM noted. No guarding or rebound. No masses appreciated.  Rectal:  Deferred  Msk:  Symmetrical without gross deformities. Normal posture. Extremities:  Without edema. Neurologic:  Alert and  oriented x4 Psych:  Alert and cooperative. Normal mood and affect.

## 2018-03-22 NOTE — Telephone Encounter (Signed)
Called [employer]. No PA needed for TCS since he will be done with conscious sedation. Ref# [id number].

## 2018-03-22 NOTE — Patient Instructions (Addendum)
We have scheduled you for a colonoscopy with Dr. Oneida Alar in the near future.  I have ordered blood work to be done today. We will see you in August 2019. If liver numbers keep going up, we will do more extensive blood work and further imaging. For now, continue tight control of blood sugars, cholesterol, exercise, dietary modifications, and weight loss.   It was a pleasure to see you today. I strive to create trusting relationships with patients to provide genuine, compassionate, and quality care. I value your feedback. If you receive a survey regarding your visit,  I greatly appreciate you taking time to fill this out.   Annitta Needs, PhD, ANP-BC [facility]    Fatty Liver Fatty liver, also called hepatic steatosis or steatohepatitis, is a condition in which too much fat has built up in your liver cells. The liver removes harmful substances from your bloodstream. It produces fluids your body needs. It also helps your body use and store energy from the food you eat. In many cases, fatty liver does not cause symptoms or problems. It is often diagnosed when tests are being done for other reasons. However, over time, fatty liver can cause inflammation that may lead to more serious liver problems, such as scarring of the liver (cirrhosis). What are the causes? Causes of fatty liver may include:  Drinking too much alcohol.  Poor nutrition.  Obesity.  Cushing syndrome.  Diabetes.  Hyperlipidemia.  Pregnancy.  Certain drugs.  Poisons.  Some viral infections.  What increases the risk? You may be more likely to develop fatty liver if you:  Abuse alcohol.  Are pregnant.  Are overweight.  Have diabetes.  Have hepatitis.  Have a high triglyceride level.  What are the signs or symptoms? Fatty liver often does not cause any symptoms. In cases where symptoms develop, they can include:  Fatigue.  Weakness.  Weight loss.  Confusion.  Abdominal  pain.  Yellowing of your skin and the white parts of your eyes (jaundice).  Nausea and vomiting.  How is this diagnosed? Fatty liver may be diagnosed by:  Physical exam and medical history.  Blood tests.  Imaging tests, such as an ultrasound, CT scan, or MRI.  Liver biopsy. A small sample of liver tissue is removed using a needle. The sample is then looked at under a microscope.  How is this treated? Fatty liver is often caused by other health conditions. Treatment for fatty liver may involve medicines and lifestyle changes to manage conditions such as:  Alcoholism.  High cholesterol.  Diabetes.  Being overweight or obese.  Follow these instructions at home:  Eat a healthy diet as directed by your health care provider.  Exercise regularly. This can help you lose weight and control your cholesterol and diabetes. Talk to your health care provider about an exercise plan and which activities are best for you.  Do not drink alcohol.  Take medicines only as directed by your health care provider. Contact a health care provider if: You have difficulty controlling your:  Blood sugar.  Cholesterol.  Alcohol consumption.  Get help right away if:  You have abdominal pain.  You have jaundice.  You have nausea and vomiting. This information is not intended to replace advice given to you by your health care provider. Make sure you discuss any questions you have with your health care provider. Document Released: 12/23/2005 Document Revised: 04/14/2016 Document Reviewed: 03/19/2014 Elsevier Interactive Patient Education  [employer].

## 2018-03-23 LAB — HEPATITIS B SURFACE ANTIGEN: Hepatitis B Surface Ag: NONREACTIVE

## 2018-03-23 LAB — HEPATITIS B CORE ANTIBODY, TOTAL: Hep B Core Total Ab: NONREACTIVE

## 2018-03-23 LAB — HEPATITIS B SURFACE ANTIBODY,QUALITATIVE: Hep B S Ab: NONREACTIVE

## 2018-03-23 LAB — HEPATITIS A ANTIBODY, TOTAL: Hepatitis A AB,Total: NONREACTIVE

## 2018-03-23 LAB — HEPATITIS C ANTIBODY
Hepatitis C Ab: NONREACTIVE
SIGNAL TO CUT-OFF: 0.01 (ref <1.00)

## 2018-03-25 NOTE — Assessment & Plan Note (Signed)
57 year old male with need for initial screening colonoscopy. No significant changes in bowel habits, no family history of colorectal cancer or polyps. Rare straining, and will note just scant tissue hematochezia chronically with this. Otherwise, no rectal bleeding.   Proceed with colonoscopy with Dr. Oneida Alar in the near future. The risks, benefits, and alternatives have been discussed in detail with the patient. They state understanding and desire to proceed.  As of note, was on Plavix in the past but now on Effient for the past few years  No oral diabetes medication day of procedure

## 2018-03-25 NOTE — Assessment & Plan Note (Signed)
Severe hepatic steatosis on recent CT in Jan 2019. Mildly elevated ALT, otherwise LFTs normal. No thrombocytopenia, no splenomegaly. Doubt dealing with underlying advanced liver disease but discussed importance of weight loss, dietary modification, other strict management of comorbidities to include glycemic control, cholesterol management, etc.   Will check viral serologies now to have on file, iron studies, hold on extensive serologies now but will check if LFTs increasing despite dietary/behavior modifications, weight loss. Could consider elastography in future although this tends to "overcall" things; could also consider the fibrosure test. Limit/avoid alcohol. Return in Aug 2019 to review labs done in interim (upcoming with PCP) and decide future evaluation/management depending on these results.

## 2018-03-25 NOTE — Assessment & Plan Note (Signed)
Chronic GERD, on omeprazole daily. No dysphagia, early satiety. He notes intermittent vague LUQ discomfort unprovoked. No association with eating. No prior EGD. Several risk factors for Barrett's esophagus, so EGD can be arranged at time of colonoscopy. Unclear etiology for dyspepsia but most likely gastritis, possible esophagitis, doubt malignancy or PUD.   Proceed with upper endoscopy in the near future with Dr. Oneida Alar. The risks, benefits, and alternatives have been discussed in detail with patient. They have stated understanding and desire to proceed.  Continue omeprazole daily: may need different PPI

## 2018-03-26 NOTE — Progress Notes (Signed)
CC'ED TO PCP 

## 2018-04-01 ENCOUNTER — Other Ambulatory Visit: Payer: Self-pay | Admitting: Cardiology

## 2018-04-03 NOTE — Progress Notes (Signed)
Negative Hep C and Hepatitis B. Needs Hep A and B vaccinations. Normal ferritin, no iron overload. Keep appt for August.

## 2018-04-03 NOTE — Progress Notes (Signed)
Pt is aware. He said he had Hep A and Hep B vaccinations about 8 years ago. Please advise!

## 2018-04-04 NOTE — Progress Notes (Signed)
These serologies are showing he is not immune. Keep appt for August and can discuss then.

## 2018-04-05 ENCOUNTER — Other Ambulatory Visit: Payer: Self-pay

## 2018-04-05 MED ORDER — PRASUGREL HCL 10 MG PO TABS: 10.0000 mg | ORAL_TABLET | Freq: Every day | ORAL | 0 refills | Status: DC

## 2018-04-05 MED ORDER — PRASUGREL HCL 10 MG PO TABS: 10.0000 mg | ORAL_TABLET | Freq: Every day | ORAL | 3 refills | Status: DC

## 2018-04-05 NOTE — Progress Notes (Signed)
LMOM for a return call.  

## 2018-04-09 NOTE — Progress Notes (Signed)
LMOM to call.

## 2018-04-09 NOTE — Progress Notes (Signed)
Mailed a letter with info.

## 2018-04-30 ENCOUNTER — Other Ambulatory Visit: Payer: Self-pay | Admitting: *Deleted

## 2018-04-30 MED ORDER — VALSARTAN-HYDROCHLOROTHIAZIDE 320-25 MG PO TABS: 1.0000 | ORAL_TABLET | Freq: Every day | ORAL | 0 refills | Status: DC

## 2018-05-01 NOTE — Progress Notes (Signed)
Cardiology Office Note  Date: 05/02/2018   ID: [name], DOB 1961-05-15, MRN [id number]  PCP: Celene Squibb, MD  Primary Cardiologist: Rozann Lesches, MD   Chief Complaint  Patient presents with  . Coronary Artery Disease    History of Present Illness: [name] is a 57 y.o. male last seen in November 2018.  He presents for a routine visit.  Reports stable exertional angina symptoms, not requiring nitroglycerin at this point.  He continues to work full-time.  He has also been trying to lose some weight, although his regular exercise has dropped off.  We discussed this today.  Most recent cardiac catheterization in October2016showed total occlusion of the RCA with new severe stenosis of the circumflex and severe distal edge stent restenosis of the LAD. He underwent placement of DES to the circumflex and LAD at that time. LVEF was 40-45%.  We discussed increasing his Crestor to get LDL down further, last in the 90s.  Also went over diet and exercise.  I personally reviewed his ECG today which shows sinus rhythm with old inferior infarct pattern.  Past Medical History:  Diagnosis Date  . Coronary atherosclerosis of native coronary artery    a. remote LAD stent. b. subsequent stents to RCA with restenosis. c. DES to RCA and Circ 06/2007 in setting of restenosis. d. DES to LAD & RCA in 01/2010. e. DES to Cx and LAD; total occlusion of RCA, LVEF 40-45%.  . Diabetes mellitus type 2, diet-controlled ([name])   . Essential hypertension   . Hepatic steatosis   . Hyperlipidemia   . Ischemic cardiomyopathy   . MI (myocardial infarction) ([name])    Anterior  . Obstructive sleep apnea   . Thrombocytopenia ([name])    patient denies    Past Surgical History:  Procedure Laterality Date  . CARDIAC CATHETERIZATION N/A 09/17/2015   Procedure: Left Heart Cath and Coronary Angiography;  Surgeon: [name], MD;  Location: [name];  Service: Cardiovascular;  Laterality: N/A;    . CORONARY ANGIOPLASTY WITH STENT PLACEMENT    . left carpal tunnel    . ROTATOR CUFF REPAIR    . WISDOM TOOTH EXTRACTION      Current Outpatient Medications  Medication Sig Dispense Refill  . aspirin 81 MG tablet Take 1 tablet (81 mg total) by mouth daily. 90 tablet 3  . Coenzyme Q10 (COQ-10) 200 MG CAPS Take 200 mg by mouth daily.     . dapagliflozin propanediol (FARXIGA) 10 MG TABS tablet Take 10 mg by mouth daily.    . fenofibrate 160 MG tablet Take 160 mg by mouth daily.    . isosorbide mononitrate (IMDUR) 30 MG 24 hr tablet Take 1 tablet (30 mg total) by mouth daily. 90 tablet 3  . MAGNESIUM GLUCONATE PO Take 100 mg by mouth 2 (two) times daily.     . metoprolol succinate (TOPROL-XL) 25 MG 24 hr tablet TAKE 3 BY MOUTH DAILY -    TAKE 1 BY MOUTH IN THE     MORNING AND 2 BY MOUTH IN  THE EVENING (Patient taking differently: Take 75 mg by mouth daily. ) 270 tablet 1  . Multiple Vitamin (MULTIVITAMIN) tablet Take 1 tablet by mouth daily.      . nitroGLYCERIN (NITROSTAT) 0.4 MG SL tablet Place 1 tablet (0.4 mg total) under the tongue every 5 (five) minutes as needed. For chest pain (Patient taking differently: Place 0.4 mg under the tongue every 5 (five)  minutes as needed for chest pain. ) 25 tablet 6  . Omega-3 Fatty Acids (FISH OIL) 1000 MG CAPS Take 2,000 mg by mouth 2 (two) times daily.     [name] omeprazole (PRILOSEC) 20 MG capsule Take 1 capsule (20 mg total) by mouth daily. 90 capsule 1  . prasugrel (EFFIENT) 10 MG TABS tablet Take 1 tablet (10 mg total) by mouth daily. 30 tablet 0  . rosuvastatin (CRESTOR) 20 MG tablet Take 1 tablet (20 mg total) by mouth at bedtime. 90 tablet 3  . SitaGLIPtin-MetFORMIN HCl 272 371 7190 MG TB24 Take 1 tablet by mouth daily.    . valsartan-hydrochlorothiazide (DIOVAN-HCT) 320-25 MG tablet Take 1 tablet by mouth daily. 90 tablet 0   No current facility-administered medications for this visit.    Allergies:  Reopro [abciximab]   Social History: The patient   reports that he quit smoking about 17 years ago. His smoking use included cigarettes. He started smoking about 40 years ago. He has never used smokeless tobacco. He reports that he does not drink alcohol or use drugs.   ROS:  Please see the history of present illness. Otherwise, complete review of systems is positive for none.  All other systems are reviewed and negative.   Physical Exam: VS:  BP 116/72   Pulse 74   Ht 6\' 1"  (1.854 m)   Wt 230 lb 6.4 oz (104.5 kg)   SpO2 94%   BMI 30.40 kg/m , BMI Body mass index is 30.4 kg/m.  Wt Readings from Last 3 Encounters:  05/02/18 230 lb 6.4 oz (104.5 kg)  03/22/18 233 lb 12.8 oz (106.1 kg)  10/02/17 230 lb (104.3 kg)    General: Patient appears comfortable at rest. HEENT: Conjunctiva and lids normal, oropharynx clear. Neck: Supple, no elevated JVP or carotid bruits, no thyromegaly. Lungs: Clear to auscultation, nonlabored breathing at rest. Cardiac: Regular rate and rhythm, no S3 or significant systolic murmur, no pericardial rub. Abdomen: Soft, nontender, bowel sounds present. Extremities: No pitting edema, distal pulses 2+. Skin: Warm and dry. Musculoskeletal: No kyphosis. Neuropsychiatric: Alert and oriented x3, affect grossly appropriate.  ECG: I personally reviewed the tracing from 03/31/2017 which showed sinus rhythm with old anterior infarct pattern.  Recent Labwork: 12/15/2017: Creatinine, Ser 0.90  December 2018: Hemoglobin 15.8, platelets 150, BUN 16, creatinine 0.93, potassium 4.4, AST 36, ALT 60, cholesterol 173, triglycerides 213, HDL 33, LDL 97, hemoglobin A1c 6.5  Assessment and Plan:  1.  CAD status post multiple PCI, most recently DES to the circumflex and LAD with known occlusion of the RCA in 2016.  He has stable exertional angina intermittently, not requiring nitroglycerin at this point.  Plan to continue medical therapy and observation.  ECG reviewed and stable.  2.  Ischemic cardiomyopathy, LVEF approximately 45%  at angiography in 2016.  We will likely obtain an echocardiogram around the time of his next visit.  He has had no evidence of volume overload.  3.  Mixed hyperlipidemia, increase Crestor to 40 mg daily.  Last LDL was 97.  Repeat LFTs and FLP in 8 weeks.  4.  Essential hypertension, blood pressure is well controlled today.  Current medicines were reviewed with the patient today.   Orders Placed This Encounter  Procedures  . EKG 12-Lead    Disposition: Follow-up in 6 months.  Signed, Satira Sark, MD, [facility] 05/02/2018 3:43 PM    [place] [facility] [address], [place], [place] [address] Phone: [phone];  Fax: [phone]

## 2018-05-02 ENCOUNTER — Encounter: Payer: Self-pay | Admitting: *Deleted

## 2018-05-02 ENCOUNTER — Ambulatory Visit: Payer: BLUE CROSS/BLUE SHIELD | Admitting: Cardiology

## 2018-05-02 VITALS — BP 116/72 | HR 74 | Ht 73.0 in | Wt 230.4 lb

## 2018-05-02 DIAGNOSIS — I255 Ischemic cardiomyopathy: Secondary | ICD-10-CM | POA: Diagnosis not present

## 2018-05-02 DIAGNOSIS — I25119 Atherosclerotic heart disease of native coronary artery with unspecified angina pectoris: Secondary | ICD-10-CM | POA: Diagnosis not present

## 2018-05-02 DIAGNOSIS — E782 Mixed hyperlipidemia: Secondary | ICD-10-CM | POA: Diagnosis not present

## 2018-05-02 DIAGNOSIS — Z5181 Encounter for therapeutic drug level monitoring: Secondary | ICD-10-CM

## 2018-05-02 DIAGNOSIS — I10 Essential (primary) hypertension: Secondary | ICD-10-CM | POA: Diagnosis not present

## 2018-05-02 MED ORDER — ROSUVASTATIN CALCIUM 40 MG PO TABS: 40.0000 mg | ORAL_TABLET | Freq: Every day | ORAL | 3 refills | Status: DC

## 2018-05-02 NOTE — Patient Instructions (Signed)
Medication Instructions:   Your physician has recommended you make the following change in your medication:   Increase crestor to 40 mg by mouth daily. You may take (2) of your 20 mg tablets daily until they are finished.  Continue all other medications the same.  Labwork:  Your physician recommends that you return for a FASTING lipid/liver profile in 8 weeks. Please make sure you are fasting at least 8 hours.  Testing/Procedures:  NONE  Follow-Up:  Your physician recommends that you schedule a follow-up appointment in: 6 months. You will receive a reminder letter in the mail in about 4 months reminding you to call and schedule your appointment. If you don't receive this letter, please contact our office.  Any Other Special Instructions Will Be Listed Below (If Applicable).  If you need a refill on your cardiac medications before your next appointment, please call your pharmacy.

## 2018-05-04 ENCOUNTER — Other Ambulatory Visit: Payer: Self-pay

## 2018-05-04 ENCOUNTER — Encounter (HOSPITAL_COMMUNITY)
Admission: RE | Disposition: A | Discharge status: Home / Self Care | Payer: Self-pay | Source: Ambulatory Visit | Attending: Gastroenterology

## 2018-05-04 ENCOUNTER — Encounter (HOSPITAL_COMMUNITY): Payer: Self-pay | Admitting: *Deleted

## 2018-05-04 ENCOUNTER — Ambulatory Visit (HOSPITAL_COMMUNITY)
Admission: RE | Admit: 2018-05-04 | Discharge: 2018-05-04 | Disposition: A | Discharge status: Home / Self Care | Payer: BLUE CROSS/BLUE SHIELD | Source: Ambulatory Visit | Attending: Gastroenterology | Admitting: Gastroenterology

## 2018-05-04 DIAGNOSIS — Z1211 Encounter for screening for malignant neoplasm of colon: Secondary | ICD-10-CM | POA: Insufficient documentation

## 2018-05-04 DIAGNOSIS — E785 Hyperlipidemia, unspecified: Secondary | ICD-10-CM | POA: Diagnosis not present

## 2018-05-04 DIAGNOSIS — D128 Benign neoplasm of rectum: Secondary | ICD-10-CM | POA: Diagnosis not present

## 2018-05-04 DIAGNOSIS — K2970 Gastritis, unspecified, without bleeding: Secondary | ICD-10-CM | POA: Diagnosis not present

## 2018-05-04 DIAGNOSIS — I252 Old myocardial infarction: Secondary | ICD-10-CM | POA: Diagnosis not present

## 2018-05-04 DIAGNOSIS — I10 Essential (primary) hypertension: Secondary | ICD-10-CM | POA: Diagnosis not present

## 2018-05-04 DIAGNOSIS — I2510 Atherosclerotic heart disease of native coronary artery without angina pectoris: Secondary | ICD-10-CM | POA: Diagnosis not present

## 2018-05-04 DIAGNOSIS — Z7982 Long term (current) use of aspirin: Secondary | ICD-10-CM | POA: Diagnosis not present

## 2018-05-04 DIAGNOSIS — K648 Other hemorrhoids: Secondary | ICD-10-CM | POA: Diagnosis not present

## 2018-05-04 DIAGNOSIS — D122 Benign neoplasm of ascending colon: Secondary | ICD-10-CM | POA: Insufficient documentation

## 2018-05-04 DIAGNOSIS — Q438 Other specified congenital malformations of intestine: Secondary | ICD-10-CM | POA: Diagnosis not present

## 2018-05-04 DIAGNOSIS — K2980 Duodenitis without bleeding: Secondary | ICD-10-CM | POA: Diagnosis not present

## 2018-05-04 DIAGNOSIS — K2950 Unspecified chronic gastritis without bleeding: Secondary | ICD-10-CM | POA: Insufficient documentation

## 2018-05-04 DIAGNOSIS — Z79899 Other long term (current) drug therapy: Secondary | ICD-10-CM | POA: Diagnosis not present

## 2018-05-04 DIAGNOSIS — Z13810 Encounter for screening for upper gastrointestinal disorder: Secondary | ICD-10-CM

## 2018-05-04 DIAGNOSIS — Z1389 Encounter for screening for other disorder: Secondary | ICD-10-CM | POA: Insufficient documentation

## 2018-05-04 DIAGNOSIS — E119 Type 2 diabetes mellitus without complications: Secondary | ICD-10-CM | POA: Diagnosis not present

## 2018-05-04 DIAGNOSIS — Z7984 Long term (current) use of oral hypoglycemic drugs: Secondary | ICD-10-CM | POA: Diagnosis not present

## 2018-05-04 DIAGNOSIS — I255 Ischemic cardiomyopathy: Secondary | ICD-10-CM | POA: Insufficient documentation

## 2018-05-04 DIAGNOSIS — K317 Polyp of stomach and duodenum: Secondary | ICD-10-CM | POA: Diagnosis not present

## 2018-05-04 DIAGNOSIS — Z87891 Personal history of nicotine dependence: Secondary | ICD-10-CM | POA: Insufficient documentation

## 2018-05-04 DIAGNOSIS — G4733 Obstructive sleep apnea (adult) (pediatric): Secondary | ICD-10-CM | POA: Diagnosis not present

## 2018-05-04 HISTORY — PX: BIOPSY: SHX5522

## 2018-05-04 HISTORY — PX: COLONOSCOPY: SHX5424

## 2018-05-04 HISTORY — PX: POLYPECTOMY: SHX5525

## 2018-05-04 LAB — GLUCOSE, CAPILLARY: Glucose-Capillary: 99 mg/dL (ref 65–99)

## 2018-05-04 SURGERY — COLONOSCOPY
Anesthesia: Moderate Sedation

## 2018-05-04 MED ORDER — LIDOCAINE VISCOUS HCL 2 % MT SOLN
OROMUCOSAL | Status: AC
  Filled 2018-05-04: qty 15

## 2018-05-04 MED ORDER — MIDAZOLAM HCL 5 MG/5ML IJ SOLN
INTRAMUSCULAR | Status: AC
  Filled 2018-05-04: qty 10

## 2018-05-04 MED ORDER — MEPERIDINE HCL 100 MG/ML IJ SOLN
INTRAMUSCULAR | Status: AC
  Filled 2018-05-04: qty 2

## 2018-05-04 MED ORDER — STERILE WATER FOR IRRIGATION IR SOLN
Status: DC | PRN
  Administered 2018-05-04: 2.5 mL

## 2018-05-04 MED ORDER — MIDAZOLAM HCL 5 MG/5ML IJ SOLN
INTRAMUSCULAR | Status: DC | PRN
  Administered 2018-05-04: 2 mg via INTRAVENOUS
  Administered 2018-05-04: 1 mg via INTRAVENOUS
  Administered 2018-05-04: 2 mg via INTRAVENOUS
  Administered 2018-05-04: 1 mg via INTRAVENOUS

## 2018-05-04 MED ORDER — MEPERIDINE HCL 100 MG/ML IJ SOLN
INTRAMUSCULAR | Status: DC | PRN
  Administered 2018-05-04 (×2): 25 mg via INTRAVENOUS
  Administered 2018-05-04: 50 mg via INTRAVENOUS

## 2018-05-04 MED ORDER — SODIUM CHLORIDE 0.9 % IV SOLN
INTRAVENOUS | Status: DC
  Administered 2018-05-04: 10:00:00 via INTRAVENOUS

## 2018-05-04 NOTE — Op Note (Signed)
[facility] Patient Name: [name] Procedure Date: 05/04/2018 11:35 AM MRN: [id number] Date of Birth: 1961-04-29 Attending MD: Barney Drain MD, MD CSN: [id number] Age: 57 Admit Type: Outpatient Procedure:                Colonoscopy WITH SNARE CUATERY POLYPECTOMY Indications:              Screening for colorectal malignant neoplasm Providers:                Barney Drain MD, MD, Lurline Del, RN, Aram Candela Referring MD:             Edwinna Areola. [place] Crane MD Medicines:                Meperidine 100 mg IV, Midazolam 6 mg IV Complications:            No immediate complications. Estimated Blood Loss:     Estimated blood loss was minimal. Procedure:                Pre-Anesthesia Assessment:                           - Prior to the procedure, a History and Physical                            was performed, and patient medications and                            allergies were reviewed. The patient's tolerance of                            previous anesthesia was also reviewed. The risks                            and benefits of the procedure and the sedation                            options and risks were discussed with the patient.                            All questions were answered, and informed consent                            was obtained. Prior Anticoagulants: The patient                            last took aspirin on the day of the procedure and                            Effient (prasugrel) on the day of the procedure.                            ASA Grade Assessment: II - A patient with mild                            systemic disease. After reviewing the risks and  benefits, the patient was deemed in satisfactory                            condition to undergo the procedure. After obtaining                            informed consent, the colonoscope was passed under                            direct vision. Throughout the procedure, the               patient's blood pressure, pulse, and oxygen                            saturations were monitored continuously. The                            EC-[id number] ([id number]) scope was introduced through                            the anus and advanced to the the cecum, identified                            by appendiceal orifice and ileocecal valve. The                            colonoscopy was somewhat difficult due to                            significant looping. Successful completion of the                            procedure was aided by straightening and shortening                            the scope to obtain bowel loop reduction and                            COLOWRAP. The patient tolerated the procedure                            fairly well. The quality of the bowel preparation                            was good. The ileocecal valve, appendiceal orifice,                            and rectum were photographed. Scope In: 12:09:50 PM Scope Out: 12:34:30 PM Scope Withdrawal Time: 0 hours 21 minutes 23 seconds  Total Procedure Duration: 0 hours 24 minutes 40 seconds  Findings:      Two sessile polyps were found in the rectum and ascending colon. The       polyps were 5 to 8 mm in size. These polyps were removed with a hot  snare. Resection and retrieval were complete.      The recto-sigmoid colon, sigmoid colon and descending colon were       moderately redundant.      Internal hemorrhoids were found. The hemorrhoids were small. Impression:               - Two 5 to 8 mm polyps in the rectum and in the                            ascending colon, removed with a hot snare. Resected                            and retrieved.                           - Redundant LEFT colon.                           - Internal hemorrhoids. Moderate Sedation:      Moderate (conscious) sedation was administered by the endoscopy nurse       and supervised by the endoscopist. The following  parameters were       monitored: oxygen saturation, heart rate, blood pressure, and response       to care. Total physician intraservice time was 63 minutes. Recommendation:           - Repeat colonoscopy in 5 years for surveillance.                           - High fiber diet.                           - Continue present medications.                           - Await pathology results.                           - Patient has a contact number available for                            emergencies. The signs and symptoms of potential                            delayed complications were discussed with the                            patient. Return to normal activities tomorrow.                            Written discharge instructions were provided to the                            patient. Procedure Code(s):        --- Professional ---                           [phone], Colonoscopy, flexible;  with removal of                            tumor(s), polyp(s), or other lesion(s) by snare                            technique                           G0500, Moderate sedation services provided by the                            same physician or other qualified health care                            professional performing a gastrointestinal                            endoscopic service that sedation supports,                            requiring the presence of an independent trained                            observer to assist in the monitoring of the                            patient's level of consciousness and physiological                            status; initial 15 minutes of intra-service time;                            patient age 90 years or older (additional time may                            be reported with [phone], as appropriate)                           [phone], Moderate sedation services provided by the                            same physician or other qualified health care                             professional performing the diagnostic or                            therapeutic service that the sedation supports,                            requiring the presence of an independent trained                            observer to assist in the monitoring  of the                            patient's level of consciousness and physiological                            status; each additional 15 minutes intraservice                            time (List separately in addition to code for                            primary service)                           [phone], Moderate sedation services provided by the                            same physician or other qualified health care                            professional performing the diagnostic or                            therapeutic service that the sedation supports,                            requiring the presence of an independent trained                            observer to assist in the monitoring of the                            patient's level of consciousness and physiological                            status; each additional 15 minutes intraservice                            time (List separately in addition to code for                            primary service)                           [phone], Moderate sedation services provided by the                            same physician or other qualified health care                            professional performing the diagnostic or                            therapeutic service that the sedation supports,  requiring the presence of an independent trained                            observer to assist in the monitoring of the                            patient's level of consciousness and physiological                            status; each additional 15 minutes intraservice                            time (List separately in addition to  code for                            primary service) Diagnosis Code(s):        --- Professional ---                           Z12.11, Encounter for screening for malignant                            neoplasm of colon                           K62.1, Rectal polyp                           D12.2, Benign neoplasm of ascending colon                           K64.8, Other hemorrhoids                           Q43.8, Other specified congenital malformations of                            intestine CPT copyright 2017 [employer]. All rights reserved. The codes documented in this report are preliminary and upon coder review may  be revised to meet current compliance requirements. Barney Drain, MD Barney Drain MD, MD 05/04/2018 1:15:14 PM This report has been signed electronically. Number of Addenda: 0

## 2018-05-04 NOTE — H&P (Signed)
Primary Care Physician:  Celene Squibb, MD Primary Gastroenterologist:  Dr. Oneida Alar  Pre-Procedure History & Physical: HPI:  [name] is a 57 y.o. male here for COLON CANCER SCREENING/SCREENING FOR BARRETT'S ESOPHAGUS.  Past Medical History:  Diagnosis Date  . Coronary atherosclerosis of native coronary artery    a. remote LAD stent. b. subsequent stents to RCA with restenosis. c. DES to RCA and Circ 06/2007 in setting of restenosis. d. DES to LAD & RCA in 01/2010. e. DES to Cx and LAD; total occlusion of RCA, LVEF 40-45%.  . Diabetes mellitus type 2, diet-controlled ([place])   . Essential hypertension   . Hepatic steatosis   . Hyperlipidemia   . Ischemic cardiomyopathy   . MI (myocardial infarction) ([place])   . Obstructive sleep apnea   . Thrombocytopenia ([place])     Past Surgical History:  Procedure Laterality Date  . CARDIAC CATHETERIZATION N/A 09/17/2015   Procedure: Left Heart Cath and Coronary Angiography;  Surgeon: [name], MD;  Location: [facility];  Service: Cardiovascular;  Laterality: N/A;  . CORONARY ANGIOPLASTY WITH STENT PLACEMENT    . left carpal tunnel    . ROTATOR CUFF REPAIR    . WISDOM TOOTH EXTRACTION      Prior to Admission medications   Medication Sig Start Date End Date Taking? Authorizing Provider  aspirin 81 MG tablet Take 1 tablet (81 mg total) by mouth daily. 09/18/15  Yes Dunn, Dayna N, PA-C  Coenzyme Q10 (COQ-10) 200 MG CAPS Take 200 mg by mouth daily.    Yes [provider]  dapagliflozin propanediol (FARXIGA) 10 MG TABS tablet Take 10 mg by mouth daily.   Yes [provider]  fenofibrate 160 MG tablet Take 160 mg by mouth daily.   Yes [provider]  isosorbide mononitrate (IMDUR) 30 MG 24 hr tablet Take 1 tablet (30 mg total) by mouth daily. 02/01/18  Yes Satira Sark, MD  MAGNESIUM GLUCONATE PO Take 100 mg by mouth 2 (two) times daily.    Yes [provider]  metoprolol succinate (TOPROL-XL) 25  MG 24 hr tablet TAKE 3 BY MOUTH DAILY -    TAKE 1 BY MOUTH IN THE     MORNING AND 2 BY MOUTH IN  THE EVENING Patient taking differently: Take 75 mg by mouth daily.  03/06/18  Yes Satira Sark, MD  Multiple Vitamin (MULTIVITAMIN) tablet Take 1 tablet by mouth daily.     Yes [provider]  nitroGLYCERIN (NITROSTAT) 0.4 MG SL tablet Place 1 tablet (0.4 mg total) under the tongue every 5 (five) minutes as needed. For chest pain Patient taking differently: Place 0.4 mg under the tongue every 5 (five) minutes as needed for chest pain.  04/27/15  Yes Satira Sark, MD  Omega-3 Fatty Acids (FISH OIL) 1000 MG CAPS Take 2,000 mg by mouth 2 (two) times daily.    Yes [provider]  omeprazole (PRILOSEC) 20 MG capsule Take 1 capsule (20 mg total) by mouth daily. 03/06/18  Yes Satira Sark, MD  prasugrel (EFFIENT) 10 MG TABS tablet Take 1 tablet (10 mg total) by mouth daily. 04/05/18  Yes Satira Sark, MD  rosuvastatin (CRESTOR) 40 MG tablet Take 1 tablet (40 mg total) by mouth daily. 05/02/18 07/31/18 Yes Satira Sark, MD  SitaGLIPtin-MetFORMIN HCl [phone] MG TB24 Take 1 tablet by mouth daily.   Yes [provider]  valsartan-hydrochlorothiazide (DIOVAN-HCT) 320-25 MG tablet Take 1 tablet by  mouth daily. 04/30/18  Yes Satira Sark, MD  sodium chloride (OCEAN NASAL SPRAY) 0.65 % nasal spray Place 1 spray into the nose as needed for congestion. 11/09/11 05/02/18  Verl Dicker, PA-C    Allergies as of 03/22/2018 - Review Complete 03/22/2018  Allergen Reaction Noted  . Reopro [abciximab] Other (See Comments) 11/09/2011    Family History  Problem Relation Age of Onset  . Coronary artery disease Unknown   . Hypertension Mother   . Diabetes Mother   . Heart attack Mother   . Diabetes Father   . CVA Father   . Diabetes Brother   . Hypertension Brother   . Colon cancer Neg Hx   . Colon polyps Neg Hx     Social History   Socioeconomic History  .  Marital status: Married    Spouse name: Not on file  . Number of children: Not on file  . Years of education: Not on file  . Highest education level: Not on file  Occupational History  . 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 Financial resource strain: Not on file  . Food insecurity:    Worry: Not on file    Inability: Not on file  . Transportation needs:    Medical: Not on file    Non-medical: Not on file  Tobacco Use  . Smoking status: Former Smoker    Types: Cigarettes    Start date: 11/21/1977    Last attempt to quit: 08/04/2000    Years since quitting: 17.7  . Smokeless tobacco: Never Used  . Tobacco comment: Tobacco - no  Substance and Sexual Activity  . Alcohol use: No    Alcohol/week: 0.0 oz    Comment: rare, maybe 1 beer once a month   . Drug use: No  . Sexual activity: Not on file  Lifestyle  . Physical activity:    Days per week: Not on file    Minutes per session: Not on file  . Stress: Not on file  Relationships  . Social connections:    Talks on phone: Not on file    Gets together: Not on file    Attends religious service: Not on file    Active member of club or organization: Not on file    Attends meetings of clubs or organizations: Not on file    Relationship status: Not on file  . Intimate partner violence:    Fear of current or ex partner: Not on file    Emotionally abused: Not on file    Physically abused: Not on file    Forced sexual activity: Not on file  Other Topics Concern  . Not on file  Social History Narrative  . Not on file    Review of Systems: See HPI, otherwise negative ROS   Physical Exam: BP 123/72   Pulse 68   Temp 97.6 F (36.4 C) (Oral)   Resp 19   SpO2 97%  General:   Alert,  pleasant and cooperative in NAD Head:  Normocephalic and atraumatic. Neck:  Supple; Lungs:  Clear throughout to auscultation.    Heart:  Regular rate and rhythm. Abdomen:  Soft, nontender and nondistended.  Normal bowel sounds, without guarding, and without rebound.   Neurologic:  Alert and  oriented x4;  grossly normal neurologically.  Impression/Plan:    SCREENING FOR BARRETT'S ESOPHAGUS/COLON CANCER SCREENING.[name]  PLAN:  1.EGD/TCS TODAY. DISCUSSED PROCEDURE, BENEFITS, & RISKS: < 1% chance of medication  reaction, bleeding, perforation, or rupture of spleen/liver.

## 2018-05-04 NOTE — Op Note (Addendum)
[facility] Patient Name: [name] Procedure Date: 05/04/2018 11:56 AM MRN: [id number] Date of Birth: Jul 14, 1961 Attending MD: Barney Drain MD, MD CSN: [id number] Age: 57 Admit Type: Outpatient Procedure:                Upper GI endoscopy WITH COLD FORCEPS BIOPSY Indications:              Screening for Barrett's esophagus Providers:                Barney Drain MD, MD, Lurline Del, RN, Nelma Rothman,                            Technician Referring MD:             Edwinna Areola. Hall MD Medicines:                TCS + Midazolam 2 mg IV Complications:            No immediate complications. Estimated Blood Loss:     Estimated blood loss was minimal. Procedure:                Pre-Anesthesia Assessment:                           - Prior to the procedure, a History and Physical                            was performed, and patient medications and                            allergies were reviewed. The patient's tolerance of                            previous anesthesia was also reviewed. The risks                            and benefits of the procedure and the sedation                            options and risks were discussed with the patient.                            All questions were answered, and informed consent                            was obtained. Prior Anticoagulants: The patient                            last took aspirin on the day of the procedure and                            Effient (prasugrel) on the day of the procedure.                            ASA Grade Assessment: II - A patient with mild  systemic disease. After reviewing the risks and                            benefits, the patient was deemed in satisfactory                            condition to undergo the procedure. After obtaining                            informed consent, the endoscope was passed under                            direct vision. Throughout the procedure, the                             patient's blood pressure, pulse, and oxygen                            saturations were monitored continuously. The                            [id number] ([id number]) scope was introduced through the                            mouth, and advanced to the second part of duodenum.                            The upper GI endoscopy was accomplished without                            difficulty. The patient tolerated the procedure                            well. Scope In: 12:45:18 PM Scope Out: 70:26:37 PM Total Procedure Duration: 0 hours 11 minutes 41 seconds  Findings:      The examined esophagus was normal.      Diffuse mild inflammation characterized by congestion (edema) and       erythema was found on the greater curvature of the stomach and in the       gastric antrum. Biopsies were taken with a cold forceps for Helicobacter       pylori testing.      The examined duodenum was normal.      A few sessile polyps were found in the gastric fundus. This was biopsied       with a cold forceps for histology. Impression:               - MILD Gastritis. Biopsied. Moderate Sedation:      Moderate (conscious) sedation was administered by the endoscopy nurse       and supervised by the endoscopist. The following parameters were       monitored: oxygen saturation, heart rate, blood pressure, and response       to care. Total physician intraservice time was 63 minutes. Recommendation:           - Await pathology results.                           -  High fiber diet and low fat diet.                           - Continue present medications.                           - Await pathology results.                           - Patient has a contact number available for                            emergencies. The signs and symptoms of potential                            delayed complications were discussed with the                            patient. Return to normal activities tomorrow.                             Written discharge instructions were provided to the                            patient. Procedure Code(s):        --- Professional ---                           [phone], Esophagogastroduodenoscopy, flexible,                            transoral; with biopsy, single or multiple                           G0500, Moderate sedation services provided by the                            same physician or other qualified health care                            professional performing a gastrointestinal                            endoscopic service that sedation supports,                            requiring the presence of an independent trained                            observer to assist in the monitoring of the                            patient's level of consciousness and physiological                            status; initial 15  minutes of intra-service time;                            patient age 49 years or older (additional time may                            be reported with [phone], as appropriate)                           [phone], Moderate sedation services provided by the                            same physician or other qualified health care                            professional performing the diagnostic or                            therapeutic service that the sedation supports,                            requiring the presence of an independent trained                            observer to assist in the monitoring of the                            patient's level of consciousness and physiological                            status; each additional 15 minutes intraservice                            time (List separately in addition to code for                            primary service)                           [phone], Moderate sedation services provided by the                            same physician or other qualified health care                             professional performing the diagnostic or                            therapeutic service that the sedation supports,                            requiring the presence of an independent trained                            observer to  assist in the monitoring of the                            patient's level of consciousness and physiological                            status; each additional 15 minutes intraservice                            time (List separately in addition to code for                            primary service)                           [phone], Moderate sedation services provided by the                            same physician or other qualified health care                            professional performing the diagnostic or                            therapeutic service that the sedation supports,                            requiring the presence of an independent trained                            observer to assist in the monitoring of the                            patient's level of consciousness and physiological                            status; each additional 15 minutes intraservice                            time (List separately in addition to code for                            primary service) Diagnosis Code(s):        --- Professional ---                           K29.70, Gastritis, unspecified, without bleeding                           Z13.810, Encounter for screening for upper                            gastrointestinal disorder CPT copyright 2017 [employer]. All rights reserved. The codes documented in this report are preliminary and upon coder review may  be revised to meet current compliance  requirements. Barney Drain, MD Barney Drain MD, MD 05/04/2018 1:27:22 PM This report has been signed electronically. Number of Addenda: 0

## 2018-05-07 ENCOUNTER — Telehealth: Payer: Self-pay | Admitting: Gastroenterology

## 2018-05-07 NOTE — Telephone Encounter (Signed)
Lmom, pt notified of results.  

## 2018-05-07 NOTE — Telephone Encounter (Signed)
ON RECALL  °

## 2018-05-07 NOTE — Discharge Instructions (Signed)
You had 2 polyps removed. You have internal hemorrhoids. You have mild gastritis, AND STOMACH POLYPS. I biopsied your stomach.   DRINK WATER TO KEEP YOUR URINE LIGHT YELLOW.  CONTINUE YOUR WEIGHT LOSS EFFORTS. YOUR BODY MASS INDEX IS OVER 30 WHICH MEANS YOU ARE OBESE. OBESITY IS ASSOCIATED WITH AN INCREASED FOR CIRRHOSIS AND ALL CANCERS, INCLUDING ESOPHAGEAL AND COLON CANCER. A WEIGHT OF 225 LBS OR LESS  WILL GET YOUR BODY MASS INDEX(BMI) UNDER 30.   FOLLOW A HIGH FIBER/LOW FAT DIET. AVOID ITEMS THAT CAUSE BLOATING. SEE INFO BELOW.  YOUR BIOPSY RESULTS WILL BE BACK IN 7 DAYS.  Next colonoscopy in 5 years because an oily film slightly obscured my view.   ENDOSCOPY Care After Read the instructions outlined below and refer to this sheet in the next week. These discharge instructions provide you with general information on caring for yourself after you leave the hospital. While your treatment has been planned according to the most current medical practices available, unavoidable complications occasionally occur. If you have any problems or questions after discharge, call DR. Yovani Cogburn, [phone].  ACTIVITY  You may resume your regular activity, but move at a slower pace for the next 24 hours.   Take frequent rest periods for the next 24 hours.   Walking will help get rid of the air and reduce the bloated feeling in your belly (abdomen).   No driving for 24 hours (because of the medicine (anesthesia) used during the test).   You may shower.   Do not sign any important legal documents or operate any machinery for 24 hours (because of the anesthesia used during the test).    NUTRITION  Drink plenty of fluids.   You may resume your normal diet as instructed by your doctor.   Begin with a light meal and progress to your normal diet. Heavy or fried foods are harder to digest and may make you feel sick to your stomach (nauseated).   Avoid alcoholic beverages for 24 hours or as  instructed.    MEDICATIONS  You may resume your normal medications.   WHAT YOU CAN EXPECT TODAY  Some feelings of bloating in the abdomen.   Passage of more gas than usual.   Spotting of blood in your stool or on the toilet paper  .  IF YOU HAD POLYPS REMOVED DURING THE ENDOSCOPY:  Eat a soft diet IF YOU HAVE NAUSEA, BLOATING, ABDOMINAL PAIN, OR VOMITING.    FINDING OUT THE RESULTS OF YOUR TEST Not all test results are available during your visit. DR. Oneida Alar WILL CALL YOU WITHIN 7 DAYS OF YOUR PROCEDUE WITH YOUR RESULTS. Do not assume everything is normal if you have not heard from DR. Emili Mcloughlin IN ONE WEEK, CALL HER OFFICE AT [phone].  SEEK IMMEDIATE MEDICAL ATTENTION AND CALL THE OFFICE: [phone] IF:  You have more than a spotting of blood in your stool.   Your belly is swollen (abdominal distention).   You are nauseated or vomiting.   You have a temperature over 101F.   You have abdominal pain or discomfort that is severe or gets worse throughout the day.   Gastritis  Gastritis is an inflammation (the body's way of reacting to injury and/or infection) of the stomach. It is often caused by viral or bacterial (germ) infections. It can also be caused BY ASPIRIN, BC/GOODY POWDER'S, (IBUPROFEN) MOTRIN, OR ALEVE (NAPROXEN), chemicals (including alcohol), SPICY FOODS, and medications. This illness may be associated with generalized malaise (feeling tired, not well),  UPPER ABDOMINAL STOMACH cramps, and fever. One common bacterial cause of gastritis is an organism known as H. Pylori. This can be treated with antibiotics.    High-Fiber Diet A high-fiber diet changes your normal diet to include more whole grains, legumes, fruits, and vegetables. Changes in the diet involve replacing refined carbohydrates with unrefined foods. The calorie level of the diet is essentially unchanged. The Dietary Reference Intake (recommended amount) for adult males is 38 grams per day. For  adult females, it is 25 grams per day. Pregnant and lactating women should consume 28 grams of fiber per day. Fiber is the intact part of a plant that is not broken down during digestion. Functional fiber is fiber that has been isolated from the plant to provide a beneficial effect in the body. PURPOSE  Increase stool bulk.   Ease and regulate bowel movements.   Lower cholesterol.  INDICATIONS THAT YOU NEED MORE FIBER  Constipation and hemorrhoids.   Uncomplicated diverticulosis (intestine condition) and irritable bowel syndrome.   Weight management.   As a protective measure against hardening of the arteries (atherosclerosis), diabetes, and cancer.   GUIDELINES FOR INCREASING FIBER IN THE DIET  Start adding fiber to the diet slowly. A gradual increase of about 5 more grams (2 slices of whole-wheat bread, 2 servings of most fruits or vegetables, or 1 bowl of high-fiber cereal) per day is best. Too rapid an increase in fiber may result in constipation, flatulence, and bloating.   Drink enough water and fluids to keep your urine clear or pale yellow. Water, juice, or caffeine-free drinks are recommended. Not drinking enough fluid may cause constipation.   Eat a variety of high-fiber foods rather than one type of fiber.   Try to increase your intake of fiber through using high-fiber foods rather than fiber pills or supplements that contain small amounts of fiber.   The goal is to change the types of food eaten. Do not supplement your present diet with high-fiber foods, but replace foods in your present diet.   INCLUDE A VARIETY OF FIBER SOURCES  Replace refined and processed grains with whole grains, canned fruits with fresh fruits, and incorporate other fiber sources. White rice, white breads, and most bakery goods contain little or no fiber.   Brown whole-grain rice, buckwheat oats, and many fruits and vegetables are all good sources of fiber. These include: broccoli, Brussels  sprouts, cabbage, cauliflower, beets, sweet potatoes, white potatoes (skin on), carrots, tomatoes, eggplant, squash, berries, fresh fruits, and dried fruits.   Cereals appear to be the richest source of fiber. Cereal fiber is found in whole grains and bran. Bran is the fiber-rich outer coat of cereal grain, which is largely removed in refining. In whole-grain cereals, the bran remains. In breakfast cereals, the largest amount of fiber is found in those with "bran" in their names. The fiber content is sometimes indicated on the label.   You may need to include additional fruits and vegetables each day.   In baking, for 1 cup white flour, you may use the following substitutions:   1 cup whole-wheat flour minus 2 tablespoons.   1/2 cup white flour plus 1/2 cup whole-wheat flour.   Low-Fat Diet BREADS, CEREALS, PASTA, RICE, DRIED PEAS, AND BEANS These products are high in carbohydrates and most are low in fat. Therefore, they can be increased in the diet as substitutes for fatty foods. They too, however, contain calories and should not be eaten in excess. Cereals can be eaten for  snacks as well as for breakfast.  Include foods that contain fiber (fruits, vegetables, whole grains, and legumes). Research shows that fiber may lower blood cholesterol levels, especially the water-soluble fiber found in fruits, vegetables, oat products, and legumes. FRUITS AND VEGETABLES It is good to eat fruits and vegetables. Besides being sources of fiber, both are rich in vitamins and some minerals. They help you get the daily allowances of these nutrients. Fruits and vegetables can be used for snacks and desserts. MEATS Limit lean meat, chicken, [place], and fish to no more than 6 ounces per day. Beef, Pork, and Lamb Use lean cuts of beef, pork, and lamb. Lean cuts include:  Extra-lean ground beef.  Arm roast.  Sirloin tip.  Center-cut ham.  Round steak.  Loin chops.  Rump roast.  Tenderloin.  Trim all fat  off the outside of meats before cooking. It is not necessary to severely decrease the intake of red meat, but lean choices should be made. Lean meat is rich in protein and contains a highly absorbable form of iron. Premenopausal women, in particular, should avoid reducing lean red meat because this could increase the risk for low red blood cells (iron-deficiency anemia). The organ meats, such as liver, sweetbreads, kidneys, and brain are very rich in cholesterol. They should be limited. Chicken and [place] These are good sources of protein. The fat of poultry can be reduced by removing the skin and underlying fat layers before cooking. Chicken and [place] can be substituted for lean red meat in the diet. Poultry should not be fried or covered with high-fat sauces. Fish and Shellfish Fish is a good source of protein. Shellfish contain cholesterol, but they usually are low in saturated fatty acids. The preparation of fish is important. Like chicken and [place], they should not be fried or covered with high-fat sauces. EGGS Egg whites contain no fat or cholesterol. They can be eaten often. Try 1 to 2 egg whites instead of whole eggs in recipes or use egg substitutes that do not contain yolk. MILK AND DAIRY PRODUCTS Use skim or 1% milk instead of 2% or whole milk. Decrease whole milk, natural, and processed cheeses. Use nonfat or low-fat (2%) cottage cheese or low-fat cheeses made from vegetable oils. Choose nonfat or low-fat (1 to 2%) yogurt. Experiment with evaporated skim milk in recipes that call for heavy cream. Substitute low-fat yogurt or low-fat cottage cheese for sour cream in dips and salad dressings. Have at least 2 servings of low-fat dairy products, such as 2 glasses of skim (or 1%) milk each day to help get your daily calcium intake.  FATS AND OILS Reduce the total intake of fats, especially saturated fat. Butterfat, lard, and beef fats are high in saturated fat and cholesterol. These should be  avoided as much as possible. Vegetable fats do not contain cholesterol, but certain vegetable fats, such as coconut oil, palm oil, and palm kernel oil are very high in saturated fats. These should be limited. These fats are often used in bakery goods, processed foods, popcorn, oils, and nondairy creamers. Vegetable shortenings and some peanut butters contain hydrogenated oils, which are also saturated fats. Read the labels on these foods and check for saturated vegetable oils. Unsaturated vegetable oils and fats do not raise blood cholesterol. However, they should be limited because they are fats and are high in calories. Total fat should still be limited to 30% of your daily caloric intake. Desirable liquid vegetable oils are corn oil, cottonseed oil, olive oil,  canola oil, safflower oil, soybean oil, and sunflower oil. Peanut oil is not as good, but small amounts are acceptable. Buy a heart-healthy tub margarine that has no partially hydrogenated oils in the ingredients. Mayonnaise and salad dressings often are made from unsaturated fats, but they should also be limited because of their high calorie and fat content. Seeds, nuts, peanut butter, olives, and avocados are high in fat, but the fat is mainly the unsaturated type. These foods should be limited mainly to avoid excess calories and fat. OTHER EATING TIPS Snacks  Most sweets should be limited as snacks. They tend to be rich in calories and fats, and their caloric content outweighs their nutritional value. Some good choices in snacks are graham crackers, melba toast, soda crackers, bagels (no egg), English muffins, fruits, and vegetables. These snacks are preferable to snack crackers, [place] fries, and chips. Popcorn should be air-popped or cooked in small amounts of liquid vegetable oil. Desserts Eat fruit, low-fat yogurt, and fruit ices. AVOID pastries, cake, and cookies. Sherbet, angel food cake, gelatin dessert, frozen low-fat yogurt, or other  frozen products that do not contain saturated fat (pure fruit juice bars, frozen ice pops) are also acceptable.  COOKING METHODS Choose those methods that use little or no fat. They include: Poaching.  Braising.  Steaming.  Grilling.  Baking.  Stir-frying.  Broiling.  Microwaving.  Foods can be cooked in a nonstick pan without added fat, or use a nonfat cooking spray in regular cookware. Limit fried foods and avoid frying in saturated fat. Add moisture to lean meats by using water, broth, cooking wines, and other nonfat or low-fat sauces along with the cooking methods mentioned above. Soups and stews should be chilled after cooking. The fat that forms on top after a few hours in the refrigerator should be skimmed off. When preparing meals, avoid using excess salt. Salt can contribute to raising blood pressure in some people. EATING AWAY FROM HOME Order entres, potatoes, and vegetables without sauces or butter. When meat exceeds the size of a deck of cards (3 to 4 ounces), the rest can be taken home for another meal. Choose vegetable or fruit salads and ask for low-calorie salad dressings to be served on the side. Use dressings sparingly. Limit high-fat toppings, such as bacon, crumbled eggs, cheese, sunflower seeds, and olives. Ask for heart-healthy tub margarine instead of butter.  Hemorrhoids Hemorrhoids are dilated (enlarged) veins around the rectum. Sometimes clots will form in the veins. This makes them swollen and painful. These are called thrombosed hemorrhoids. Causes of hemorrhoids include:  Constipation.   Straining to have a bowel movement.   HEAVY LIFTING HOME CARE INSTRUCTIONS  Eat a well balanced diet and drink 6 to 8 glasses of water every day to avoid constipation. You may also use a bulk laxative.   Avoid straining to have bowel movements.   Keep anal area dry and clean.   Do not use a donut shaped pillow or sit on the toilet for long periods. This increases blood  pooling and pain.   Move your bowels when your body has the urge; this will require less straining and will decrease pain and pressure.

## 2018-05-07 NOTE — Telephone Encounter (Signed)
Please call pt. His stomach Bx shows mild gastritis/DUODENITIS AND BENIGN STOMACH POLYPS.    DRINK WATER TO KEEP YOUR URINE LIGHT YELLOW.  CONTINUE YOUR WEIGHT LOSS EFFORTS. A WEIGHT OF 225 LBS OR LESS  WILL GET YOUR BODY MASS INDEX(BMI) UNDER 30.   FOLLOW A HIGH FIBER/LOW FAT DIET. AVOID ITEMS THAT CAUSE BLOATING.   Next colonoscopy in 5 years because an oily film slightly obscured my view. DO NOT DRINK BROTH WITH OIL/FAT DROPLETS IN IT ON THE DAY BEFORE YOUR COLONOSCOPY.

## 2018-05-10 ENCOUNTER — Encounter (HOSPITAL_COMMUNITY): Payer: Self-pay | Admitting: Gastroenterology

## 2018-06-09 DIAGNOSIS — E1165 Type 2 diabetes mellitus with hyperglycemia: Secondary | ICD-10-CM | POA: Diagnosis not present

## 2018-06-09 DIAGNOSIS — E782 Mixed hyperlipidemia: Secondary | ICD-10-CM | POA: Diagnosis not present

## 2018-06-09 DIAGNOSIS — I10 Essential (primary) hypertension: Secondary | ICD-10-CM | POA: Diagnosis not present

## 2018-06-09 DIAGNOSIS — R945 Abnormal results of liver function studies: Secondary | ICD-10-CM | POA: Diagnosis not present

## 2018-06-14 DIAGNOSIS — R945 Abnormal results of liver function studies: Secondary | ICD-10-CM | POA: Diagnosis not present

## 2018-06-14 DIAGNOSIS — E782 Mixed hyperlipidemia: Secondary | ICD-10-CM | POA: Diagnosis not present

## 2018-06-14 DIAGNOSIS — I10 Essential (primary) hypertension: Secondary | ICD-10-CM | POA: Diagnosis not present

## 2018-06-14 DIAGNOSIS — E1165 Type 2 diabetes mellitus with hyperglycemia: Secondary | ICD-10-CM | POA: Diagnosis not present

## 2018-07-03 ENCOUNTER — Ambulatory Visit: Payer: BLUE CROSS/BLUE SHIELD | Admitting: Gastroenterology

## 2018-07-24 ENCOUNTER — Other Ambulatory Visit: Payer: Self-pay

## 2018-07-24 MED ORDER — VALSARTAN-HYDROCHLOROTHIAZIDE 320-25 MG PO TABS: 1.0000 | ORAL_TABLET | Freq: Every day | ORAL | 3 refills | Status: DC

## 2018-07-24 NOTE — Telephone Encounter (Signed)
Refilled diovan hct

## 2018-08-20 ENCOUNTER — Other Ambulatory Visit: Payer: Self-pay | Admitting: Cardiology

## 2018-08-30 DIAGNOSIS — Z6833 Body mass index (BMI) 33.0-33.9, adult: Secondary | ICD-10-CM | POA: Diagnosis not present

## 2018-08-30 DIAGNOSIS — H00015 Hordeolum externum left lower eyelid: Secondary | ICD-10-CM | POA: Diagnosis not present

## 2018-09-02 ENCOUNTER — Other Ambulatory Visit: Payer: Self-pay | Admitting: Cardiology

## 2018-10-04 DIAGNOSIS — I10 Essential (primary) hypertension: Secondary | ICD-10-CM | POA: Diagnosis not present

## 2018-10-04 DIAGNOSIS — R5381 Other malaise: Secondary | ICD-10-CM | POA: Diagnosis not present

## 2018-10-04 DIAGNOSIS — R945 Abnormal results of liver function studies: Secondary | ICD-10-CM | POA: Diagnosis not present

## 2018-10-04 DIAGNOSIS — E1165 Type 2 diabetes mellitus with hyperglycemia: Secondary | ICD-10-CM | POA: Diagnosis not present

## 2018-10-04 DIAGNOSIS — E782 Mixed hyperlipidemia: Secondary | ICD-10-CM | POA: Diagnosis not present

## 2018-10-05 ENCOUNTER — Telehealth: Payer: Self-pay | Admitting: Cardiology

## 2018-10-05 NOTE — Telephone Encounter (Signed)
Needs RX for Omeprazole and Phenophibrate sent to [facility]/tg

## 2018-10-08 MED ORDER — OMEPRAZOLE 20 MG PO CPDR: 20.0000 mg | DELAYED_RELEASE_CAPSULE | Freq: Every day | ORAL | 3 refills | Status: DC

## 2018-10-08 MED ORDER — FENOFIBRATE 160 MG PO TABS: 160.0000 mg | ORAL_TABLET | Freq: Every day | ORAL | 3 refills | Status: DC

## 2018-10-08 NOTE — Telephone Encounter (Signed)
Refill sent.

## 2018-10-11 DIAGNOSIS — E1165 Type 2 diabetes mellitus with hyperglycemia: Secondary | ICD-10-CM | POA: Diagnosis not present

## 2018-10-11 DIAGNOSIS — Z0000 Encounter for general adult medical examination without abnormal findings: Secondary | ICD-10-CM | POA: Diagnosis not present

## 2018-10-11 DIAGNOSIS — I2510 Atherosclerotic heart disease of native coronary artery without angina pectoris: Secondary | ICD-10-CM | POA: Diagnosis not present

## 2018-10-11 DIAGNOSIS — E782 Mixed hyperlipidemia: Secondary | ICD-10-CM | POA: Diagnosis not present

## 2018-10-17 ENCOUNTER — Ambulatory Visit: Payer: BLUE CROSS/BLUE SHIELD | Admitting: Gastroenterology

## 2018-11-27 DIAGNOSIS — H35363 Drusen (degenerative) of macula, bilateral: Secondary | ICD-10-CM | POA: Diagnosis not present

## 2018-11-27 DIAGNOSIS — H2513 Age-related nuclear cataract, bilateral: Secondary | ICD-10-CM | POA: Diagnosis not present

## 2018-11-27 DIAGNOSIS — H179 Unspecified corneal scar and opacity: Secondary | ICD-10-CM | POA: Diagnosis not present

## 2018-11-27 DIAGNOSIS — H25013 Cortical age-related cataract, bilateral: Secondary | ICD-10-CM | POA: Diagnosis not present

## 2018-11-27 DIAGNOSIS — H40013 Open angle with borderline findings, low risk, bilateral: Secondary | ICD-10-CM | POA: Diagnosis not present

## 2018-12-13 ENCOUNTER — Ambulatory Visit (INDEPENDENT_AMBULATORY_CARE_PROVIDER_SITE_OTHER): Payer: BLUE CROSS/BLUE SHIELD | Admitting: Cardiology

## 2018-12-13 ENCOUNTER — Encounter: Payer: Self-pay | Admitting: Cardiology

## 2018-12-13 VITALS — BP 140/84 | HR 91 | Ht 72.0 in | Wt 242.0 lb

## 2018-12-13 DIAGNOSIS — I10 Essential (primary) hypertension: Secondary | ICD-10-CM

## 2018-12-13 DIAGNOSIS — I25119 Atherosclerotic heart disease of native coronary artery with unspecified angina pectoris: Secondary | ICD-10-CM | POA: Diagnosis not present

## 2018-12-13 DIAGNOSIS — I255 Ischemic cardiomyopathy: Secondary | ICD-10-CM | POA: Diagnosis not present

## 2018-12-13 DIAGNOSIS — E782 Mixed hyperlipidemia: Secondary | ICD-10-CM

## 2018-12-13 NOTE — Progress Notes (Signed)
Cardiology Office Note  Date: 12/13/2018   ID: [name], DOB November 16, 1961, MRN [id number]  PCP: Celene Squibb, MD  Primary Cardiologist: Rozann Lesches, MD   Chief Complaint  Patient presents with  . Coronary Artery Disease    History of Present Illness: [name] is a 58 y.o. male last seen in June 2019.  He presents for a routine follow-up visit.  Reports stable angina symptoms, no escalating pattern or increasing nitroglycerin use.  Still working full-time.  A lot of his angina tends to occur with emotional upset.  Most recent cardiac catheterization in October2016showed total occlusion of the RCA with new severe stenosis of the circumflex and severe distal edge stent restenosis of the LAD. He underwent placement of DES to the circumflex and LAD at that time. LVEF was 40-45%.  We discussed obtaining a follow-up echocardiogram to reassess LVEF.  His medical regimen is stable, he is due for follow-up FLP and LFTs.  We increased his Crestor to 40 mg daily at the last visit.  Past Medical History:  Diagnosis Date  . Coronary atherosclerosis of native coronary artery    a. remote LAD stent. b. subsequent stents to RCA with restenosis. c. DES to RCA and Circ 06/2007 in setting of restenosis. d. DES to LAD & RCA in 01/2010. e. DES to Cx and LAD; total occlusion of RCA, LVEF 40-45%.  . Diabetes mellitus type 2, diet-controlled ([place])   . Essential hypertension   . Hepatic steatosis   . Hyperlipidemia   . Ischemic cardiomyopathy   . MI (myocardial infarction) ([place])   . Obstructive sleep apnea   . Thrombocytopenia ([place])     Past Surgical History:  Procedure Laterality Date  . BIOPSY  05/04/2018   Procedure: BIOPSY;  Surgeon: Danie Binder, MD;  Location: [facility];  Service: Endoscopy;;  gastric  . CARDIAC CATHETERIZATION N/A 09/17/2015   Procedure: Left Heart Cath and Coronary Angiography;  Surgeon: [name], MD;  Location: [facility];  Service:  Cardiovascular;  Laterality: N/A;  . COLONOSCOPY N/A 05/04/2018   Procedure: COLONOSCOPY;  Surgeon: Danie Binder, MD;  Location: [facility];  Service: Endoscopy;  Laterality: N/A;  11:00am  . CORONARY ANGIOPLASTY WITH STENT PLACEMENT    . left carpal tunnel    . POLYPECTOMY  05/04/2018   Procedure: POLYPECTOMY;  Surgeon: Danie Binder, MD;  Location: [facility];  Service: Endoscopy;;  ascending,rectal,gastric, duodenal,  . ROTATOR CUFF REPAIR    . WISDOM TOOTH EXTRACTION      Current Outpatient Medications  Medication Sig Dispense Refill  . aspirin 81 MG tablet Take 1 tablet (81 mg total) by mouth daily. 90 tablet 3  . Coenzyme Q10 (COQ-10) 200 MG CAPS Take 200 mg by mouth daily.     . dapagliflozin propanediol (FARXIGA) 10 MG TABS tablet Take 10 mg by mouth daily.    . fenofibrate 160 MG tablet Take 1 tablet (160 mg total) by mouth daily. 90 tablet 3  . isosorbide mononitrate (IMDUR) 30 MG 24 hr tablet Take 1 tablet (30 mg total) by mouth daily. 90 tablet 3  . MAGNESIUM GLUCONATE PO Take 100 mg by mouth 2 (two) times daily.     . metoprolol succinate (TOPROL-XL) 25 MG 24 hr tablet TAKE 3 TABLETS DAILY - 1   TABLET IN THE MORNING AND 2TABLETS IN THE EVENING 270 tablet 1  . Multiple Vitamin (MULTIVITAMIN) tablet Take 1 tablet by mouth daily.      [name]  nitroGLYCERIN (NITROSTAT) 0.4 MG SL tablet Place 1 tablet (0.4 mg total) under the tongue every 5 (five) minutes as needed. For chest pain (Patient taking differently: Place 0.4 mg under the tongue every 5 (five) minutes as needed for chest pain. ) 25 tablet 6  . Omega-3 Fatty Acids (FISH OIL) 1000 MG CAPS Take 2,000 mg by mouth 2 (two) times daily.     [name] omeprazole (PRILOSEC) 20 MG capsule Take 1 capsule (20 mg total) by mouth daily. 90 capsule 3  . prasugrel (EFFIENT) 10 MG TABS tablet Take 1 tablet (10 mg total) by mouth daily. 30 tablet 0  . SitaGLIPtin-MetFORMIN HCl [id number] MG TB24 Take 1 tablet by mouth daily.    .  valsartan-hydrochlorothiazide (DIOVAN-HCT) 320-25 MG tablet Take 1 tablet by mouth daily. 90 tablet 3  . rosuvastatin (CRESTOR) 40 MG tablet Take 1 tablet (40 mg total) by mouth daily. 90 tablet 3   No current facility-administered medications for this visit.    Allergies:  Reopro [abciximab]   Social History: The patient  reports that he quit smoking about 18 years ago. His smoking use included cigarettes. He started smoking about 41 years ago. He has never used smokeless tobacco. He reports that he does not drink alcohol or use drugs.   ROS:  Please see the history of present illness. Otherwise, complete review of systems is positive for none.  All other systems are reviewed and negative.   Physical Exam: VS:  BP 140/84 (BP Location: Left Arm)   Pulse 91   Ht 6' (1.829 m)   Wt 242 lb (109.8 kg)   SpO2 96%   BMI 32.82 kg/m , BMI Body mass index is 32.82 kg/m.  Wt Readings from Last 3 Encounters:  12/13/18 242 lb (109.8 kg)  05/02/18 230 lb 6.4 oz (104.5 kg)  03/22/18 233 lb 12.8 oz (106.1 kg)    General: Patient appears comfortable at rest. HEENT: Conjunctiva and lids normal, oropharynx clear. Neck: Supple, no elevated JVP or carotid bruits, no thyromegaly. Lungs: Clear to auscultation, nonlabored breathing at rest. Cardiac: Regular rate and rhythm, no S3 or significant systolic murmur. Abdomen: Soft, nontender, bowel sounds present. Extremities: No pitting edema, distal pulses 2+. Skin: Warm and dry. Musculoskeletal: No kyphosis. Neuropsychiatric: Alert and oriented x3, affect grossly appropriate.  ECG: I personally reviewed the tracing from 05/02/2018 which showed sinus rhythm with old inferior infarct pattern.  Recent Labwork: 12/15/2017: Creatinine, Ser 0.90  December 2018: Hemoglobin 15.8, platelets 150, BUN 16, creatinine 0.93, potassium 4.4, AST 36, ALT 60, cholesterol 173, triglycerides 213, HDL 33, LDL 97, hemoglobin A1c 6.5  Assessment and Plan:  1.  CAD status  post multiple PCI's, most recently DES to the circumflex and LAD with known occlusion of the RCA in 2016.  He has stable angina we will continue with medical therapy for now.  2.  Ischemic cardiomyopathy, LVEF approximately 45% at angiography in 2016.  Follow-up echocardiogram will be obtained.  3.  Mixed hyperlipidemia, on Crestor 40 mg daily.  Follow-up FLP and LFTs.  4.  Essential hypertension, no changes made to antihypertensive regimen.  Current medicines were reviewed with the patient today.   Orders Placed This Encounter  Procedures  . Hepatic function panel  . Lipid Profile  . ECHOCARDIOGRAM COMPLETE    Disposition: Follow-up in 6 months.  Signed, Satira Sark, MD, [facility] 12/13/2018 4:47 PM    [facility] [address] [place], [place] [address]  Phone: [phone]; Fax: [phone]

## 2018-12-13 NOTE — Patient Instructions (Signed)
Medication Instructions:  Your physician recommends that you continue on your current medications as directed. Please refer to the Current Medication list given to you today.  If you need a refill on your cardiac medications before your next appointment, please call your pharmacy.   Lab work: FASTING Lipids and LFT's If you have labs (blood work) drawn today and your tests are completely normal, you will receive your results only by: [name] MyChart Message (if you have MyChart) OR . A paper copy in the mail If you have any lab test that is abnormal or we need to change your treatment, we will call you to review the results.  Testing/Procedures: Your physician has requested that you have an echocardiogram. Echocardiography is a painless test that uses sound waves to create images of your heart. It provides your doctor with information about the size and shape of your heart and how well your heart's chambers and valves are working. This procedure takes approximately one hour. There are no restrictions for this procedure.    Follow-Up: At [facility], you and your health needs are our priority.  As part of our continuing mission to provide you with exceptional heart care, we have created designated Provider Care Teams.  These Care Teams include your primary Cardiologist (physician) and Advanced Practice Providers (APPs -  Physician Assistants and Nurse Practitioners) who all work together to provide you with the care you need, when you need it. You will need a follow up appointment in 6 months.  Please call our office 2 months in advance to schedule this appointment.  You may see Rozann Lesches, MD or one of the following Advanced Practice Providers on your designated Care Team:   Bernerd Pho, PA-C [facility]) . Ermalinda Barrios, PA-C ([place])  Any Other Special Instructions Will Be Listed Below (If Applicable). NONE

## 2018-12-21 ENCOUNTER — Other Ambulatory Visit: Payer: Self-pay

## 2018-12-21 ENCOUNTER — Telehealth: Payer: Self-pay | Admitting: Cardiology

## 2018-12-21 DIAGNOSIS — Z79899 Other long term (current) drug therapy: Secondary | ICD-10-CM

## 2018-12-21 NOTE — Telephone Encounter (Signed)
Pt is needing his lab orders switched to [facility] cause he is needing to have them done on Saturday morning, please call and let him know when they're ready.

## 2018-12-21 NOTE — Telephone Encounter (Signed)
Lab orders changed to [facility], wife will pick up today

## 2018-12-22 DIAGNOSIS — Z79899 Other long term (current) drug therapy: Secondary | ICD-10-CM | POA: Diagnosis not present

## 2018-12-23 LAB — HEPATIC FUNCTION PANEL
ALT: 99 IU/L — ABNORMAL HIGH (ref 0–44)
AST: 78 IU/L — ABNORMAL HIGH (ref 0–40)
Albumin: 4.5 g/dL (ref 3.8–4.9)
Alkaline Phosphatase: 57 IU/L (ref 39–117)
Bilirubin Total: 0.4 mg/dL (ref 0.0–1.2)
Bilirubin, Direct: 0.14 mg/dL (ref 0.00–0.40)
Total Protein: 7.7 g/dL (ref 6.0–8.5)

## 2018-12-23 LAB — LIPID PANEL
Chol/HDL Ratio: 6.2 ratio — ABNORMAL HIGH (ref 0.0–5.0)
Cholesterol, Total: 162 mg/dL (ref 100–199)
HDL: 26 mg/dL — ABNORMAL LOW (ref >39)
LDL Calculated: 64 mg/dL (ref 0–99)
Triglycerides: 360 mg/dL — ABNORMAL HIGH (ref 0–149)
VLDL Cholesterol Cal: 72 mg/dL — ABNORMAL HIGH (ref 5–40)

## 2018-12-28 ENCOUNTER — Ambulatory Visit (HOSPITAL_COMMUNITY)
Admission: RE | Admit: 2018-12-28 | Discharge: 2018-12-28 | Disposition: A | Discharge status: Home / Self Care | Payer: BLUE CROSS/BLUE SHIELD | Source: Ambulatory Visit | Attending: Cardiology | Admitting: Cardiology

## 2018-12-28 DIAGNOSIS — I255 Ischemic cardiomyopathy: Secondary | ICD-10-CM

## 2018-12-28 NOTE — Progress Notes (Signed)
*  PRELIMINARY RESULTS* Echocardiogram 2D Echocardiogram has been performed.  [name] 12/28/2018, 3:47 PM

## 2018-12-31 ENCOUNTER — Telehealth: Payer: Self-pay

## 2018-12-31 DIAGNOSIS — E782 Mixed hyperlipidemia: Secondary | ICD-10-CM

## 2018-12-31 MED ORDER — ROSUVASTATIN CALCIUM 20 MG PO TABS: 20.0000 mg | ORAL_TABLET | Freq: Every day | ORAL | 3 refills | Status: DC

## 2018-12-31 NOTE — Telephone Encounter (Signed)
-----   Message from Satira Sark, MD sent at 12/24/2018  8:15 AM EST ----- Results reviewed.  LDL 64, triglycerides are increased at 360 however.  LFTs are also up with AST 78 and ALT 99.  For now would go ahead and cut Crestor back to 20 mg daily.  Follow-up FLP and LFTs in 8-12 weeks. A copy of this test should be forwarded to Celene Squibb, MD.

## 2018-12-31 NOTE — Telephone Encounter (Signed)
I spoke with patient, he will decrease crestor to 20 mg and repeat labs in 8-12 weeks, I mailed lab slip

## 2019-01-13 ENCOUNTER — Other Ambulatory Visit: Payer: Self-pay | Admitting: Cardiology

## 2019-01-25 DIAGNOSIS — R945 Abnormal results of liver function studies: Secondary | ICD-10-CM | POA: Diagnosis not present

## 2019-01-25 DIAGNOSIS — I2510 Atherosclerotic heart disease of native coronary artery without angina pectoris: Secondary | ICD-10-CM | POA: Diagnosis not present

## 2019-01-25 DIAGNOSIS — I10 Essential (primary) hypertension: Secondary | ICD-10-CM | POA: Diagnosis not present

## 2019-01-25 DIAGNOSIS — E782 Mixed hyperlipidemia: Secondary | ICD-10-CM | POA: Diagnosis not present

## 2019-01-25 DIAGNOSIS — R319 Hematuria, unspecified: Secondary | ICD-10-CM | POA: Diagnosis not present

## 2019-01-25 DIAGNOSIS — E1165 Type 2 diabetes mellitus with hyperglycemia: Secondary | ICD-10-CM | POA: Diagnosis not present

## 2019-02-18 ENCOUNTER — Other Ambulatory Visit: Payer: Self-pay | Admitting: Cardiology

## 2019-02-18 MED ORDER — ROSUVASTATIN CALCIUM 20 MG PO TABS: 20.0000 mg | ORAL_TABLET | Freq: Every day | ORAL | 3 refills | Status: DC

## 2019-02-18 NOTE — Telephone Encounter (Signed)
Refill complete 

## 2019-02-18 NOTE — Telephone Encounter (Signed)
° ° ° °  1. Which medications need to be refilled? (please list name of each medication and dose if known)  rosuvastatin (CRESTOR) 20 MG tablet    2. Which pharmacy/location (including street and city if local pharmacy) is medication to be sent to? [facility]   3. Do they need a 30 day or 90 day supply? [name]

## 2019-03-04 ENCOUNTER — Other Ambulatory Visit: Payer: Self-pay

## 2019-03-04 MED ORDER — ROSUVASTATIN CALCIUM 20 MG PO TABS: 20.0000 mg | ORAL_TABLET | Freq: Every day | ORAL | 3 refills | Status: DC

## 2019-03-04 NOTE — Telephone Encounter (Signed)
Refilled crestor 

## 2019-03-29 ENCOUNTER — Other Ambulatory Visit: Payer: Self-pay | Admitting: Cardiology

## 2019-04-12 DIAGNOSIS — E782 Mixed hyperlipidemia: Secondary | ICD-10-CM | POA: Diagnosis not present

## 2019-04-12 DIAGNOSIS — R945 Abnormal results of liver function studies: Secondary | ICD-10-CM | POA: Diagnosis not present

## 2019-04-12 DIAGNOSIS — I10 Essential (primary) hypertension: Secondary | ICD-10-CM | POA: Diagnosis not present

## 2019-04-12 DIAGNOSIS — E1165 Type 2 diabetes mellitus with hyperglycemia: Secondary | ICD-10-CM | POA: Diagnosis not present

## 2019-04-13 LAB — LIPID PANEL
Cholesterol: 290 mg/dL — ABNORMAL HIGH (ref <200)
HDL: 25 mg/dL — ABNORMAL LOW (ref >=40)
Non-HDL Cholesterol (Calc): 265 mg/dL (calc) — ABNORMAL HIGH (ref <130)
Total CHOL/HDL Ratio: 11.6 (calc) — ABNORMAL HIGH (ref <5.0)
Triglycerides: 2529 mg/dL — ABNORMAL HIGH (ref <150)

## 2019-04-13 LAB — HEPATIC FUNCTION PANEL
AG Ratio: 1.5 (calc) (ref 1.0–2.5)
ALT: 64 U/L — ABNORMAL HIGH (ref 9–46)
AST: 50 U/L — ABNORMAL HIGH (ref 10–35)
Albumin: 4.5 g/dL (ref 3.6–5.1)
Alkaline phosphatase (APISO): 47 U/L (ref 35–144)
Bilirubin, Direct: 0.1 mg/dL (ref 0.0–0.2)
Globulin: 3 g/dL (ref 1.9–3.7)
Indirect Bilirubin: 0.5 mg/dL (ref 0.2–1.2)
Total Bilirubin: 0.6 mg/dL (ref 0.2–1.2)
Total Protein: 7.5 g/dL (ref 6.1–8.1)

## 2019-04-16 ENCOUNTER — Telehealth: Payer: Self-pay

## 2019-04-16 DIAGNOSIS — E781 Pure hyperglyceridemia: Secondary | ICD-10-CM

## 2019-04-16 NOTE — Telephone Encounter (Signed)
Patient is complaint with all medications, will repeat fasting lipids in the amb ref to lipid clinic placed    Lab slip faxed to [facility] lab [phone]

## 2019-04-16 NOTE — Telephone Encounter (Signed)
-----   Message from Satira Sark, MD sent at 04/15/2019  9:23 AM EDT ----- Results reviewed.  Please verify whether this was a fasting sample or not.  Even if it was not, triglyceride levels are dangerously high, have been elevated in the past but not to this degree.  Please verify his medications which previously included high-dose Crestor, omega-3 supplements, and fenofibrate.  If this was not a fasting sample, would repeat but also anticipate referral for a lipid clinic evaluation.

## 2019-04-17 ENCOUNTER — Other Ambulatory Visit (HOSPITAL_COMMUNITY)
Admission: RE | Admit: 2019-04-17 | Discharge: 2019-04-17 | Disposition: A | Discharge status: Home / Self Care | Payer: BLUE CROSS/BLUE SHIELD | Source: Ambulatory Visit | Attending: Cardiology | Admitting: Cardiology

## 2019-04-17 DIAGNOSIS — E781 Pure hyperglyceridemia: Secondary | ICD-10-CM | POA: Insufficient documentation

## 2019-04-17 LAB — LIPID PANEL
Cholesterol: 244 mg/dL — ABNORMAL HIGH (ref 0–200)
Triglycerides: 1965 mg/dL — ABNORMAL HIGH (ref <150)

## 2019-04-18 ENCOUNTER — Telehealth: Payer: Self-pay | Admitting: Pharmacist Clinician (PhC)/ Clinical Pharmacy Specialist

## 2019-04-18 NOTE — Telephone Encounter (Signed)
Thank you for evaluating him so quickly.

## 2019-04-18 NOTE — Telephone Encounter (Signed)
Patient with elevated triglycerides (1963), requested lipid consult by Dr. Domenic Polite.  Due to COVID 19 this was a telephone visit.  Time on phone with patient was 18 minutes.    Patient has been unemployed for about 3 weeks, having lost his job due to the [place] pandemic.  His health care benefits will expire on Sunday May 31.  [name] has a history significant for CAD with DES to circumflex and LAD, DM2, hepatic steatosis, exertional angina, GERD and hyperlipidemia/hypertriglyceridemia.    In February his labs were good with TC 162, TG 360, HDL 26, LDL 64.  Unfortunately his AST and ALT had increased to 78 and 99, so the rosuvastatin was decreased form 40 mg daily to 20 mg daily.  Then when labs were re-drawn in May he was found to have TC 290, TG 2529, HDL and LDL non-calculable.  A repeat 1 week later showed TC 244 and TG 1965.  Per chart patient takes fenofibrate 160 mg, fish oil 2 caps bid, and rosuvastatin 20 mg.  He states compliance with all medications.  His last A1c, back in March was 7.4, although he suspects it could be higher now, as his morning blood sugars are now running in the low 200's.  Had recent A1c at Dr. Casimer Leek, but results have not yet come in .  We did discuss how an increase in A1c can potentially cause the increase in triglycerides, so it will be important for him to keep the A1c down as best as possible.    Reviewed diet with patient.  He states he eats 4-5 eggs each morning for breakfast with bread pudding and bacon or sausage.  Lunch is usually leftovers from dinner the night before.  Dinner is varied, anything from pizza to chicken, to country ham.  [place] of broccoli/cauliflower and green beans.  Does admit that he loves breads.  Also has been trying to cut back on potatoes in the past week.  Previously was drinking several diet Mt. Dew each day, but has not had any in the past few days and is trying to drink more water.  Only drinks beer occasionally, not even on a weekly  basis, but admits that he had beers for several nights after losing his job due to [place] 19.  Because of this, his health care benefits expire on May 31.    Because of prior increase in AST/ALT, would not recommend increasing rosuvastatin back to 40 mg at this time.  We will move his fenofibrate to breakfast time each day and switch his OTC omega-3 to Rx.  Unfortunately Vascpa copay card only works for those with [employer].  [facility] price is still > $200 for 30 days.  He can get rx omega-3 (generic Lovaza) for about $50/3 months at several pharmacies with [facility].  Will see if this is an affordable option for him.    Also had a long discussion on dietary changes.  He should not eat the eggs/bacon/bread pudding each morning.  Encouraged him to eat oatmeal at least 3-4 days per week.  Admits he likes oatmeal, but the sweetened kind.  Suggested he have one pack plain mixed with 1 pack sweetened, or this time of year, put fresh strawberries or other fruits on the plain cereal.  Also discussed his need to cut back on breads and other "white" foods, as well as potatoes.    Will have him make these changes and repeat lipid labs in 8 weeks to see  if any improvement.  At that time we can determine if he will need niaspan.  Will also repeat his LFTs at that time.  Would like to get him back on higher dose of rosuvastatin if that is a possibility.

## 2019-04-19 ENCOUNTER — Telehealth: Payer: Self-pay

## 2019-04-19 ENCOUNTER — Telehealth: Payer: Self-pay | Admitting: Pharmacist Clinician (PhC)/ Clinical Pharmacy Specialist

## 2019-04-19 DIAGNOSIS — I2510 Atherosclerotic heart disease of native coronary artery without angina pectoris: Secondary | ICD-10-CM | POA: Diagnosis not present

## 2019-04-19 DIAGNOSIS — I10 Essential (primary) hypertension: Secondary | ICD-10-CM | POA: Diagnosis not present

## 2019-04-19 DIAGNOSIS — E782 Mixed hyperlipidemia: Secondary | ICD-10-CM | POA: Diagnosis not present

## 2019-04-19 DIAGNOSIS — E1165 Type 2 diabetes mellitus with hyperglycemia: Secondary | ICD-10-CM | POA: Diagnosis not present

## 2019-04-19 MED ORDER — OMEGA-3-ACID ETHYL ESTERS 1 G PO CAPS: 2.0000 g | ORAL_CAPSULE | Freq: Two times a day (BID) | ORAL | 3 refills | Status: DC

## 2019-04-19 NOTE — Telephone Encounter (Signed)
Pt called and stated Had some medication changes and he wants a call back from you  Dc janumet(spelled wrong I'm sure), add metformin bid unsure of mg,rybelsus 3mg  qd added

## 2019-04-19 NOTE — Telephone Encounter (Signed)
rx for lovaza sent to [facility].  Patient notes he has a $30/90 day copay on generic meds.  If this does not fall in that category (or needs a PA), he is to call and we will send the rx to [facility] on [address] in [place].  [facility] will charge $52.89 for 90 day supply at that pharmacy.

## 2019-04-19 NOTE — Telephone Encounter (Signed)
Patient called back after speaking with his DM MD today.  He was to stop the Janumet, continue with metformin twice daily and add Rybelsus 3 mg qd.  He'll take that for 1 month then increase to 7 mg and hopefully 14 mg as tolerated.  Has 30 day sample for now, so cost not an issue right away.    States he will have insurance as of June 1, but it is a high deductible plan to protect him until he can get a new job/return to work.

## 2019-05-20 DIAGNOSIS — Z6833 Body mass index (BMI) 33.0-33.9, adult: Secondary | ICD-10-CM | POA: Diagnosis not present

## 2019-05-20 DIAGNOSIS — R142 Eructation: Secondary | ICD-10-CM | POA: Diagnosis not present

## 2019-05-20 DIAGNOSIS — E669 Obesity, unspecified: Secondary | ICD-10-CM | POA: Diagnosis not present

## 2019-05-20 DIAGNOSIS — E1165 Type 2 diabetes mellitus with hyperglycemia: Secondary | ICD-10-CM | POA: Diagnosis not present

## 2019-05-22 ENCOUNTER — Telehealth: Payer: Self-pay | Admitting: Cardiology

## 2019-05-22 MED ORDER — METOPROLOL SUCCINATE ER 25 MG PO TB24: ORAL_TABLET | ORAL | 1 refills | Status: DC

## 2019-05-22 MED ORDER — ROSUVASTATIN CALCIUM 20 MG PO TABS: 20.0000 mg | ORAL_TABLET | Freq: Every day | ORAL | 3 refills | Status: DC

## 2019-05-22 MED ORDER — VALSARTAN-HYDROCHLOROTHIAZIDE 320-25 MG PO TABS: 1.0000 | ORAL_TABLET | Freq: Every day | ORAL | 3 refills | Status: DC

## 2019-05-22 MED ORDER — ISOSORBIDE MONONITRATE ER 30 MG PO TB24: 30.0000 mg | ORAL_TABLET | Freq: Every day | ORAL | 3 refills | Status: DC

## 2019-05-22 MED ORDER — PRASUGREL HCL 10 MG PO TABS: 10.0000 mg | ORAL_TABLET | Freq: Every day | ORAL | 3 refills | Status: DC

## 2019-05-22 MED ORDER — FENOFIBRATE 160 MG PO TABS: 160.0000 mg | ORAL_TABLET | Freq: Every day | ORAL | 3 refills | Status: DC

## 2019-05-22 NOTE — Telephone Encounter (Signed)
Refills completed

## 2019-05-22 NOTE — Telephone Encounter (Signed)
Pt called stating he dropped off his med list last week to have all of his Rx's changed over to [facility].

## 2019-07-04 ENCOUNTER — Other Ambulatory Visit: Payer: Self-pay | Admitting: Cardiology

## 2019-07-04 MED ORDER — OMEPRAZOLE 20 MG PO CPDR: 20.0000 mg | DELAYED_RELEASE_CAPSULE | Freq: Every day | ORAL | 0 refills | Status: DC

## 2019-07-04 NOTE — Telephone Encounter (Signed)
Needing refill for omeprazole (PRILOSEC) 20 MG capsule [[id number]]  Sent to [facility]

## 2019-07-18 DIAGNOSIS — E782 Mixed hyperlipidemia: Secondary | ICD-10-CM | POA: Diagnosis not present

## 2019-07-18 DIAGNOSIS — E1165 Type 2 diabetes mellitus with hyperglycemia: Secondary | ICD-10-CM | POA: Diagnosis not present

## 2019-07-18 DIAGNOSIS — I10 Essential (primary) hypertension: Secondary | ICD-10-CM | POA: Diagnosis not present

## 2019-07-18 DIAGNOSIS — R945 Abnormal results of liver function studies: Secondary | ICD-10-CM | POA: Diagnosis not present

## 2019-07-18 DIAGNOSIS — Z20828 Contact with and (suspected) exposure to other viral communicable diseases: Secondary | ICD-10-CM | POA: Diagnosis not present

## 2019-07-22 DIAGNOSIS — E1165 Type 2 diabetes mellitus with hyperglycemia: Secondary | ICD-10-CM | POA: Diagnosis not present

## 2019-07-22 DIAGNOSIS — I10 Essential (primary) hypertension: Secondary | ICD-10-CM | POA: Diagnosis not present

## 2019-07-22 DIAGNOSIS — E782 Mixed hyperlipidemia: Secondary | ICD-10-CM | POA: Diagnosis not present

## 2019-07-22 DIAGNOSIS — R945 Abnormal results of liver function studies: Secondary | ICD-10-CM | POA: Diagnosis not present

## 2019-07-31 DIAGNOSIS — E782 Mixed hyperlipidemia: Secondary | ICD-10-CM | POA: Diagnosis not present

## 2019-07-31 DIAGNOSIS — E1165 Type 2 diabetes mellitus with hyperglycemia: Secondary | ICD-10-CM | POA: Diagnosis not present

## 2019-07-31 DIAGNOSIS — I10 Essential (primary) hypertension: Secondary | ICD-10-CM | POA: Diagnosis not present

## 2019-07-31 DIAGNOSIS — I2510 Atherosclerotic heart disease of native coronary artery without angina pectoris: Secondary | ICD-10-CM | POA: Diagnosis not present

## 2019-08-01 ENCOUNTER — Encounter: Payer: Self-pay | Admitting: Cardiology

## 2019-08-01 ENCOUNTER — Telehealth: Payer: Self-pay | Admitting: Cardiology

## 2019-08-01 NOTE — Telephone Encounter (Signed)
error 

## 2019-08-02 MED ORDER — RYBELSUS 3 MG PO TABS: 14.0000 mg | ORAL_TABLET | Freq: Every day | ORAL | 3 refills | Status: DC

## 2019-08-02 NOTE — Telephone Encounter (Signed)
Send to [facility] scheduling pool for Lipid clinc appointment.  Thanks

## 2019-08-02 NOTE — Telephone Encounter (Signed)
I spoke with patient, Dr.Hall ordered Repatha for patient ( awaits approval) , added Tresiba and increased Rybelsus.    I will [facility] [facility]

## 2019-08-02 NOTE — Telephone Encounter (Signed)
-----   Message from Satira Sark, MD sent at 08/01/2019  8:31 PM EDT ----- Results reviewed.  No substantial improvement in severe hypertriglyceridemia.  Forwarding results to lipid clinic for further recommendations.

## 2019-08-07 DIAGNOSIS — H25013 Cortical age-related cataract, bilateral: Secondary | ICD-10-CM | POA: Diagnosis not present

## 2019-08-07 DIAGNOSIS — H2513 Age-related nuclear cataract, bilateral: Secondary | ICD-10-CM | POA: Diagnosis not present

## 2019-08-07 DIAGNOSIS — H40013 Open angle with borderline findings, low risk, bilateral: Secondary | ICD-10-CM | POA: Diagnosis not present

## 2019-08-08 ENCOUNTER — Ambulatory Visit: Payer: BLUE CROSS/BLUE SHIELD

## 2019-08-12 ENCOUNTER — Telehealth: Payer: Self-pay | Admitting: Pharmacist Clinician (PhC)/ Clinical Pharmacy Specialist

## 2019-08-12 DIAGNOSIS — E782 Mixed hyperlipidemia: Secondary | ICD-10-CM

## 2019-08-12 NOTE — Telephone Encounter (Signed)
Spoke with patient.  He denies any alcohol other than an occasional beer or two, maybe twice monthly.  Compliant with all medications.     Will repeat lipid labs in another week, asked that he refrain from alcohol completely this week.   Patient voiced understanding and agreeable to plan

## 2019-08-20 NOTE — Progress Notes (Signed)
Cardiology Office Note    Date:  08/21/2019   ID:  [name], DOB Dec 31, 1960, MRN [id number]  PCP:  Celene Squibb, MD  Cardiologist: Rozann Lesches, MD    Chief Complaint  Patient presents with   Follow-up    6 month visit    History of Present Illness:    [name] is a 58 y.o. male with past medical history of CAD (s/p prior stents to LAD and RCA, DES to RCA and LCx in 2008, DES to LAD and RCA in 2011 and DES to LCx and LAD in 2016 with CTO of RCA noted), ischemic cardiomyopathy (EF 45% in 2016, at 45-50% by repeat echo in 12/2018), HTN, and HLD who presents to the office today for overdue 24-month follow-up.   He was last examined by Dr. Domenic Polite in 11/2018 and reported stable angina but denied any new symptoms. Was continued on his current medication regimen with ASA, Imdur 30mg  daily, Toprol-XL 25mg  in AM/50mg  in PM, Effient 10mg  daily, Crestor 40mg  daily, and Valsartan-HCTZ 320-25mg  daily. A follow-up echo was recommended and this showed his EF had slightly increased to 45-50% with akinesis of the apical inferior segment and mid-apical anteroseptal segment noted. Fasting lipids were obtained and showed triglycerides were significantly elevated to 2529 and he was referred to the Lipid Clinic. Was eventually started on Repatha in 07/2019 due to triglycerides remaining significantly elevated.  In talking with the patient today, he reports that he was initially having some increasing episodes of chest pain in the spring of this year but symptoms have improved over this past several months. He did lose his job due to COVID-19 but has an interview tomorrow for a new position. He says he has been active at home in doing household chores such as cleaning out the gutters and denies any symptoms with this. He does have stable anginal symptoms when walking up inclines and this is most noticeable when pulling his trash cans up the driveway.  Says this has overall been stable and occurring  for several years. Has not had to utilize SL NTG.   He denies any recent orthopnea, PND, or lower extremity edema. No recent lightheadedness, dizziness, or presyncope.  He reports good compliance with his current medication regimen and denies any noted side effects.  BP is well controlled at 127/84 during today's visit.   Past Medical History:  Diagnosis Date   Coronary atherosclerosis of native coronary artery    a. remote LAD stent. b. subsequent stents to RCA with restenosis. c. DES to RCA and Circ 06/2007 in setting of restenosis. d. DES to LAD & RCA in 01/2010. e. DES to Cx and LAD; total occlusion of RCA, LVEF 40-45%.   Diabetes mellitus type 2, diet-controlled ([place])    Essential hypertension    Hepatic steatosis    Hyperlipidemia    Ischemic cardiomyopathy    MI (myocardial infarction) ([place])    Obstructive sleep apnea    Thrombocytopenia ([place])     Past Surgical History:  Procedure Laterality Date   BIOPSY  05/04/2018   Procedure: BIOPSY;  Surgeon: Danie Binder, MD;  [facility];  Service: Endoscopy;;  gastric   CARDIAC CATHETERIZATION N/A 09/17/2015   Procedure: Left Heart Cath and Coronary Angiography;  Surgeon: [name], MD;  Location: [place] [facility] [facility];  Service: Cardiovascular;  Laterality: N/A;   COLONOSCOPY N/A 05/04/2018   Procedure: COLONOSCOPY;  Surgeon: Danie Binder, MD;  [facility]: [facility];  Service: Endoscopy;  Laterality: N/A;  11:00am   CORONARY ANGIOPLASTY WITH STENT PLACEMENT     left carpal tunnel     POLYPECTOMY  05/04/2018   Procedure: POLYPECTOMY;  Surgeon: Danie Binder, MD;  Location: [facility];  Service: Endoscopy;;  ascending,rectal,gastric, duodenal,   ROTATOR CUFF REPAIR     WISDOM TOOTH EXTRACTION      Current Medications: Outpatient Medications Prior to Visit  Medication Sig Dispense Refill   aspirin 81 MG tablet Take 1 tablet (81 mg total) by mouth daily. 90 tablet 3   Coenzyme Q10 (COQ-10)  200 MG CAPS Take 200 mg by mouth daily.      fenofibrate 160 MG tablet Take 1 tablet (160 mg total) by mouth daily. 90 tablet 3   MAGNESIUM GLUCONATE PO Take 100 mg by mouth 2 (two) times daily.      metoprolol succinate (TOPROL-XL) 25 MG 24 hr tablet Take 75 mg by mouth daily. Takes 75 mg q day     Multiple Vitamin (MULTIVITAMIN) tablet Take 1 tablet by mouth daily.       nitroGLYCERIN (NITROSTAT) 0.4 MG SL tablet Place 1 tablet (0.4 mg total) under the tongue every 5 (five) minutes as needed. For chest pain (Patient taking differently: Place 0.4 mg under the tongue every 5 (five) minutes as needed for chest pain. ) 25 tablet 6   omega-3 acid ethyl esters (LOVAZA) 1 g capsule Take 2 capsules (2 g total) by mouth 2 (two) times daily. 360 capsule 3   omeprazole (PRILOSEC) 20 MG capsule Take 1 capsule (20 mg total) by mouth daily. 90 capsule 0   prasugrel (EFFIENT) 10 MG TABS tablet Take 1 tablet (10 mg total) by mouth daily. 90 tablet 3   REPATHA SURECLICK [id number] MG/ML SOAJ INJECT 1 SYRINGE ONCE EVERY 2 WEEKS     rosuvastatin (CRESTOR) 20 MG tablet Take 1 tablet (20 mg total) by mouth daily. 30 tablet 3   Semaglutide (RYBELSUS) 3 MG TABS Take 14 mg by mouth daily. 30 tablet 3   SYNJARDY XR 25-1000 MG TB24 Take 1 tablet by mouth daily.     TRESIBA FLEXTOUCH 100 UNIT/ML SOPN FlexTouch Pen INJECT 10 20 UNITS SUBCUTANEOUSLY ONCE DAILY     valsartan-hydrochlorothiazide (DIOVAN-HCT) 320-25 MG tablet Take 1 tablet by mouth daily. 90 tablet 3   dapagliflozin propanediol (FARXIGA) 10 MG TABS tablet Take 10 mg by mouth daily.     isosorbide mononitrate (IMDUR) 30 MG 24 hr tablet Take 1 tablet (30 mg total) by mouth daily. 90 tablet 3   metFORMIN (GLUCOPHAGE) 500 MG tablet Take 500 mg by mouth 2 (two) times daily with a meal. (pt has not picked up yet, not sure if 500 or 1,000 mg bid)     metoprolol succinate (TOPROL-XL) 25 MG 24 hr tablet Take 1 Tablet in the Morning and 2 Tablets in the  Evening 270 tablet 1   Omega-3 Fatty Acids (FISH OIL) 1000 MG CAPS Take 2,000 mg by mouth 2 (two) times daily.      valsartan-hydrochlorothiazide (DIOVAN-HCT) 160-12.5 MG tablet TAKE 2 TABLETS DAILY 180 tablet 0   No facility-administered medications prior to visit.      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 strain: Not on file   Food insecurity    Worry: Not on file    Inability: Not on file   Transportation needs    Medical: Not on file    Non-medical: Not on file  Tobacco Use   Smoking status: Former Smoker    Types: Cigarettes    Start date: 11/21/1977    Quit date: 08/04/2000    Years since quitting: 19.0   Smokeless tobacco: Never Used   Tobacco comment: Tobacco - no  Substance and Sexual Activity   Alcohol use: No    Alcohol/week: 0.0 standard drinks    Comment: rare, maybe 1 beer once a month    Drug use: No   Sexual activity: Not on file  Lifestyle   Physical activity    Days per week: Not on file    Minutes per session: Not on file   Stress: Not on file  Relationships   Social connections    Talks on phone: Not on file    Gets together: Not on file    Attends religious service: Not on file    Active member of club or organization: Not on file    Attends meetings of clubs or organizations: Not on file    Relationship status: Not on file  Other Topics Concern   Not on file  Social History Narrative   Not on file     Family History:  The patient's family history includes CVA in his father; Coronary artery disease in an other family member; Diabetes in his brother, father, and mother; Heart attack in his mother; Hypertension in  his brother and mother.   Review of Systems:   Please see the history of present illness.     General:  No chills, fever, night sweats or weight changes.  Cardiovascular:  No dyspnea on exertion, edema, orthopnea, palpitations, paroxysmal nocturnal dyspnea. Positive for chest pain.  Dermatological: No rash, lesions/masses Respiratory: No cough, dyspnea Urologic: No hematuria, dysuria Abdominal:   No nausea, vomiting, diarrhea, bright red blood per rectum, melena, or hematemesis Neurologic:  No visual changes, wkns, changes in mental status. All other systems reviewed and are otherwise negative except as noted above.   Physical Exam:    VS:  BP 127/84    Pulse 97    Temp (!) 97.1 F (36.2 C) (Temporal)    Ht 6' (1.829 m)    Wt 234 lb (106.1 kg)    SpO2 95%    BMI 31.74 kg/m    General: Well developed, well nourished,male appearing in no acute distress. Head: Normocephalic, atraumatic, sclera non-icteric, no xanthomas, nares are without discharge.  Neck: No carotid bruits. JVD not elevated.  Lungs: Respirations regular and unlabored, without wheezes or rales.  Heart: Regular rate and rhythm. No S3 or S4.  No murmur, no rubs, or gallops appreciated. Abdomen: Soft, non-tender, non-distended with normoactive bowel sounds. No hepatomegaly. No rebound/guarding. No obvious abdominal masses. Msk:  Strength and tone appear normal for age. No joint deformities or effusions. Extremities: No clubbing or cyanosis. No lower extremity edema.  Distal pedal pulses are 2+ bilaterally. Neuro: Alert and oriented X 3. Moves all extremities spontaneously. No focal deficits noted. Psych:  Responds to questions appropriately with a normal affect. Skin: No rashes or lesions noted  Wt Readings from Last 3 Encounters:  08/21/19 234 lb (106.1 kg)  12/13/18 242  lb (109.8 kg)  05/02/18 230 lb 6.4 oz (104.5 kg)     Studies/Labs Reviewed:   EKG:  EKG is ordered today.  The ekg ordered today demonstrates NSR,  HR 98, with inferior and anterior infarct pattern. No acute ST changes when compared to prior tracings.   Recent Labs: 04/12/2019: ALT 64   Lipid Panel    Component Value Date/Time   CHOL 244 (H) 04/17/2019 0827   CHOL 162 12/22/2018 0825   TRIG 1,965 (H) 04/17/2019 0827   HDL NOT CALCULATED 04/17/2019 0827   HDL 26 (L) 12/22/2018 0825   CHOLHDL NOT REPORTED DUE TO HIGH TRIGLYCERIDES 04/17/2019 0827   VLDL NOT CALCULATED 04/17/2019 0827   LDLCALC NOT CALCULATED 04/17/2019 0827   LDLCALC  04/12/2019 1047     Comment:     . LDL cholesterol not calculated. Triglyceride levels greater than 400 mg/dL invalidate calculated LDL results. . Reference range: <100 . Desirable range <100 mg/dL for primary prevention;   <70 mg/dL for patients with CHD or diabetic patients  with > or = 2 CHD risk factors. [name] LDL-C is now calculated using the Martin-Hopkins  calculation, which is a validated novel method providing  better accuracy than the Friedewald equation in the  estimation of LDL-C.  Cresenciano Genre et al. Annamaria Helling. [id number]): 2061-2068  (http://education.[facility].com/faq/FAQ164)    LDLDIRECT 95.5 10/22/2007 0851    Additional studies/ records that were reviewed today include:   Cardiac Catheterization: 08/2015 1. Severe multivessel coronary artery disease as detailed with total occlusion of the RCA, severe de novo stenosis of the left circumflex, and severe distal edge stent restenosis in the LAD 2. Moderate segmental LV systolic dysfunction with LVEF estimated at 40-45%, wall motion pattern consistent with this patient's known history of anterior infarction 3. Successful 2 vessel PCI with stenting of the circumflex and LAD using a drug-eluting stent platforms  The patient should be maintained on long-term dual antiplatelet therapy. He is currently tolerating aspirin and Effient  Echocardiogram: 12/2018 IMPRESSIONS    1. The left ventricle has mildly reduced systolic  function of [id number]. The cavity size was normal. There is mildly increased left ventricular wall thickness. Echo evidence of impaired diastolic relaxation.  2. The right ventricle has normal systolic function. The cavity was normal. There is no increase in right ventricular wall thickness. Right ventricular systolic pressure could not be assessed.  3. The aortic valve is tricuspid There is mild aortic annular calcification noted.  4. The mitral valve is normal in structure.  5. The tricuspid valve is normal in structure.  6. The aortic root is normal in size and structure.  7. There is akinesis of the apical inferior left ventricular segment.  8. There is akinesis of the mid-apical anteroseptal left ventricular segment.  Assessment:    1. Coronary artery disease of native artery of native heart with stable angina pectoris (HCC)   2. Chest pain, unspecified type   3. Ischemic cardiomyopathy   4. Hyperlipidemia LDL goal <70   5. Essential hypertension      Plan:   In order of problems listed above:  1. CAD with Stable Angina - s/p prior stents to LAD and RCA, DES to RCA and LCx in 2008, DES to LAD and RCA in 2011 and DES to LCx and LAD in 2016 with CTO of RCA noted. - He was experiencing worsening symptoms in the spring but denies any change in his symptoms over the past several months. He does have stable angina  and reports known discomfort when walking up inclines or having intercourse. This has been occurring for several years and he denies any acute change in his symptoms. - Reviewed options with the patient and given that his symptoms have overall improved, will continue with medical therapy at this time and titrate Imdur to 60 mg daily. Continue ASA, Effient, statin, Repatha, and BB therapy.  If he has recurrent symptoms upon returning to work or despite titration of Imdur, may need to consider repeat catheterization in the near future given his stent burden.  Will arrange for closer  follow-up for reassessment.   2. Ischemic Cardiomyopathy - EF previously 45% in 2016, at 45-50% by repeat echo in 12/2018. He denies any recent orthopnea, PND, or lower extremity edema. Weight has overall been stable and he appears euvolemic by examination. - Continue beta-blocker and ARB.  3. HLD - FLP in 06/2019 showed total cholesterol 278 with triglycerides significantly elevated to 2265.  He has been started on Repatha and had his first injection 2 weeks ago. Being followed by the Lipid Clinic.  4. HTN - BP is well controlled at 127/84 during today's visit. Continue current medication regimen with Imdur 30 mg daily, Toprol-XL 75mg  daily, and Valsartan-HCTZ 320-25mg  daily.   Medication Adjustments/Labs and Tests Ordered: Current medicines are reviewed at length with the patient today.  Concerns regarding medicines are outlined above.  Medication changes, Labs and Tests ordered today are listed in the Patient Instructions below. Patient Instructions  Medication Instructions: INCREASE Imdur to 60 mg daily  Labwork: None today  Procedures/Testing: None  Follow-Up:  2 months in office with Dr.Mcdowell  Any Additional Special Instructions Will Be Listed Below (If Applicable).  If you need a refill on your cardiac medications before your next appointment, please call your pharmacy.   Thank you for choosing [facility] !        Signed, Erma Heritage, PA-C  08/21/2019 4:57 PM    [facility] [address] [place], [place] [address] Phone: [phone] Fax: [phone]

## 2019-08-21 ENCOUNTER — Encounter: Payer: Self-pay | Admitting: Student

## 2019-08-21 ENCOUNTER — Other Ambulatory Visit: Payer: Self-pay

## 2019-08-21 ENCOUNTER — Ambulatory Visit (INDEPENDENT_AMBULATORY_CARE_PROVIDER_SITE_OTHER): Payer: Self-pay | Admitting: Student

## 2019-08-21 VITALS — BP 127/84 | HR 97 | Temp 97.1°F | Ht 72.0 in | Wt 234.0 lb

## 2019-08-21 DIAGNOSIS — I10 Essential (primary) hypertension: Secondary | ICD-10-CM

## 2019-08-21 DIAGNOSIS — E785 Hyperlipidemia, unspecified: Secondary | ICD-10-CM

## 2019-08-21 DIAGNOSIS — R079 Chest pain, unspecified: Secondary | ICD-10-CM

## 2019-08-21 DIAGNOSIS — I255 Ischemic cardiomyopathy: Secondary | ICD-10-CM

## 2019-08-21 DIAGNOSIS — I25118 Atherosclerotic heart disease of native coronary artery with other forms of angina pectoris: Secondary | ICD-10-CM

## 2019-08-21 MED ORDER — ISOSORBIDE MONONITRATE ER 60 MG PO TB24: 60.0000 mg | ORAL_TABLET | Freq: Every day | ORAL | 3 refills | Status: DC

## 2019-08-21 NOTE — Patient Instructions (Signed)
Medication Instructions: INCREASE Imdur to 60 mg daily  Labwork: None today  Procedures/Testing: None  Follow-Up:  2 months in office with Dr.Mcdowell  Any Additional Special Instructions Will Be Listed Below (If Applicable).     If you need a refill on your cardiac medications before your next appointment, please call your pharmacy.     Thank you for choosing [facility] !

## 2019-08-26 DIAGNOSIS — E782 Mixed hyperlipidemia: Secondary | ICD-10-CM | POA: Diagnosis not present

## 2019-08-27 LAB — LIPID PANEL
Chol/HDL Ratio: 3.6 ratio (ref 0.0–5.0)
Cholesterol, Total: 103 mg/dL (ref 100–199)
HDL: 29 mg/dL — ABNORMAL LOW (ref >39)
Triglycerides: 666 mg/dL (ref 0–149)

## 2019-08-27 LAB — HEPATIC FUNCTION PANEL
ALT: 46 IU/L — ABNORMAL HIGH (ref 0–44)
AST: 30 IU/L (ref 0–40)
Albumin: 4.6 g/dL (ref 3.8–4.9)
Alkaline Phosphatase: 47 IU/L (ref 39–117)
Bilirubin Total: 0.3 mg/dL (ref 0.0–1.2)
Bilirubin, Direct: 0.1 mg/dL (ref 0.00–0.40)
Total Protein: 7.2 g/dL (ref 6.0–8.5)

## 2019-09-09 DIAGNOSIS — I2510 Atherosclerotic heart disease of native coronary artery without angina pectoris: Secondary | ICD-10-CM | POA: Diagnosis not present

## 2019-09-09 DIAGNOSIS — E782 Mixed hyperlipidemia: Secondary | ICD-10-CM | POA: Diagnosis not present

## 2019-09-09 DIAGNOSIS — E669 Obesity, unspecified: Secondary | ICD-10-CM | POA: Diagnosis not present

## 2019-09-09 DIAGNOSIS — E1165 Type 2 diabetes mellitus with hyperglycemia: Secondary | ICD-10-CM | POA: Diagnosis not present

## 2019-09-10 ENCOUNTER — Other Ambulatory Visit: Payer: Self-pay | Admitting: Cardiology

## 2019-09-29 IMAGING — CT CT ABD-PEL WO/W CM
3 of 12 series · 13 of 46 positions shown, 19 images · IV contrast (Isovue)
Comparison: 12/08/2008

CLINICAL DATA: Hematuria.  Nephrolithiasis.

EXAM:
CT ABDOMEN AND PELVIS WITHOUT AND WITH CONTRAST
TECHNIQUE: Multidetector CT imaging of the abdomen and pelvis was performed
following the standard protocol before and following the bolus
administration of intravenous contrast.
CONTRAST:  125mL L9KAMF-4WW IOPAMIDOL (L9KAMF-4WW) INJECTION 61%

[Series 4: axial pre · axial · non-contrast · 0.92mm/px · z∈[+947,+1167]mm · 4 of 103 slices shown]
[im 15/103  soft-tissue]
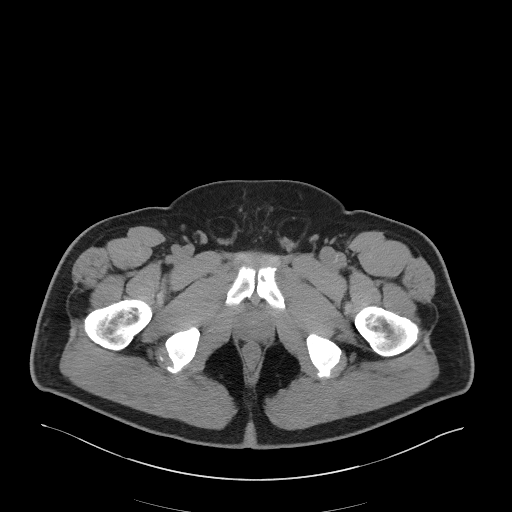
[im 30/103  soft-tissue]
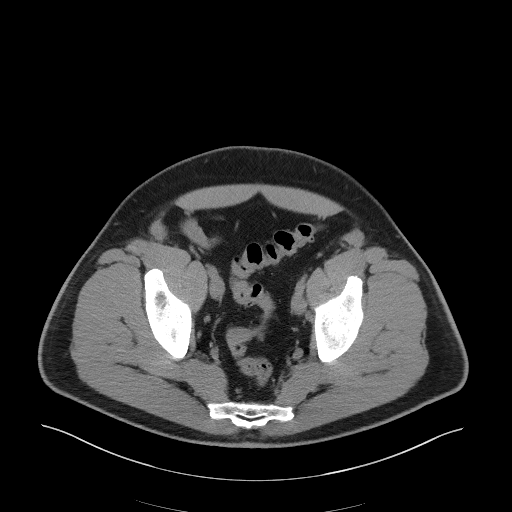
[im 44/103  soft-tissue]
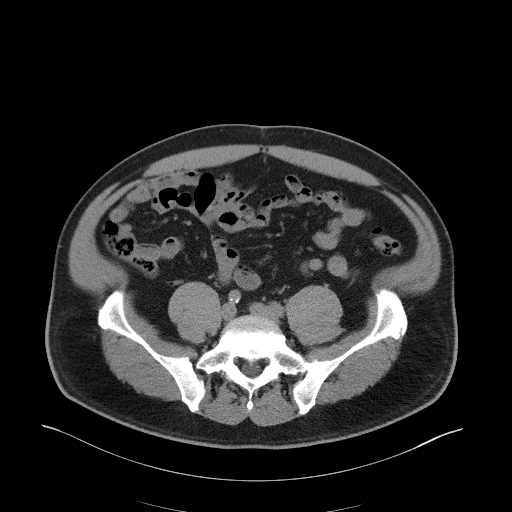
[im 59/103  soft-tissue]
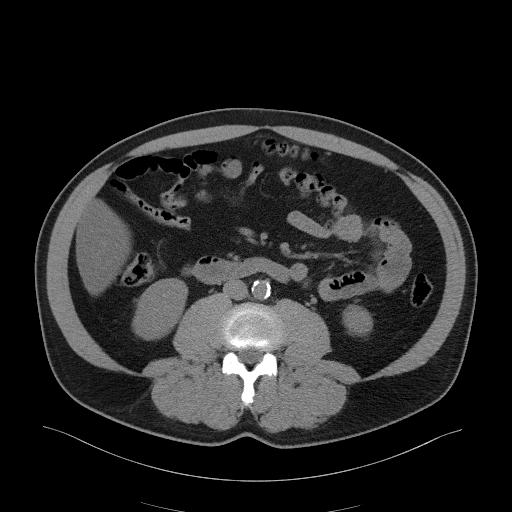

[Series 7: coronal pre · coronal · non-contrast · 0.91mm/px · 2 of 101 slices shown, 3 images]
[im 34/101  soft-tissue]
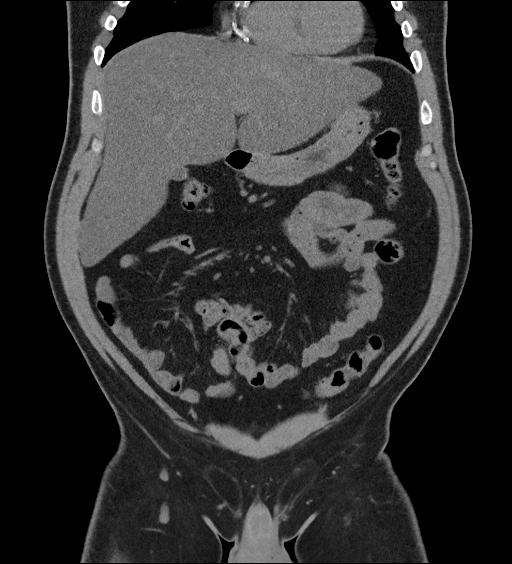
[im 34/101  bone]
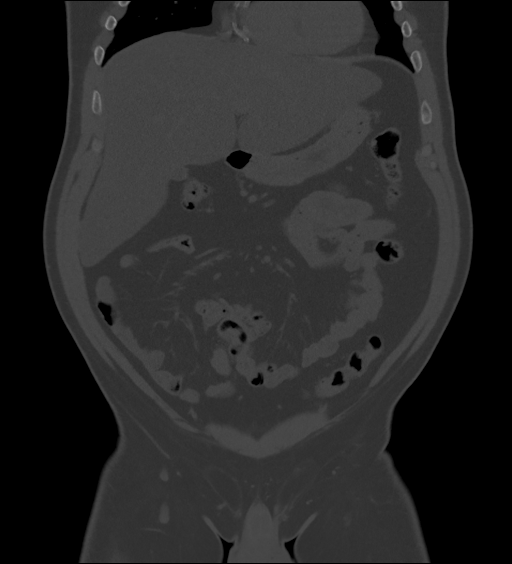
[im 67/101  soft-tissue]
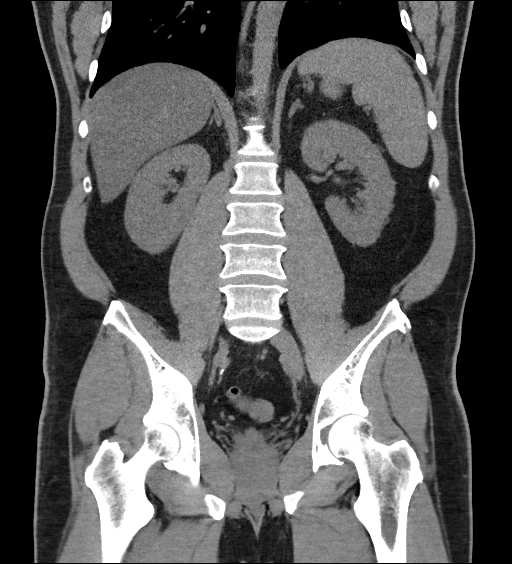

[Series 14: axial delay · axial · delayed · 0.96mm/px · z∈[+949,+1369]mm · 7 of 113 slices shown, 12 images]
[im 15/113  soft-tissue]
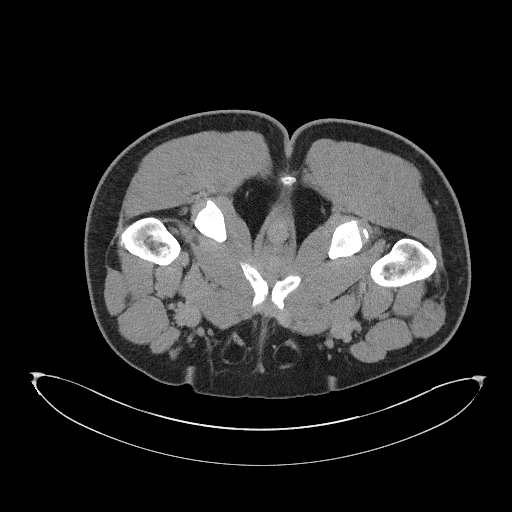
[im 15/113  bone]
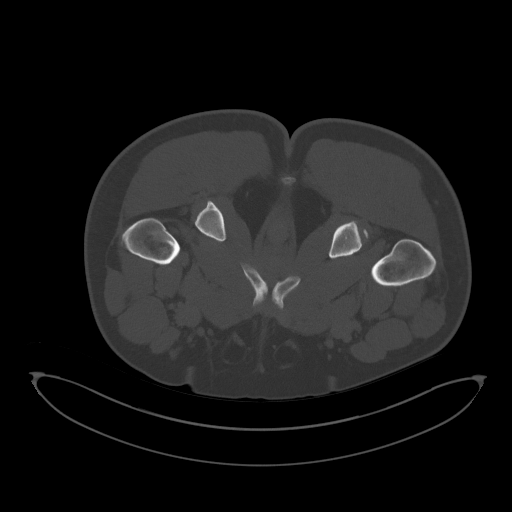
[im 29/113  soft-tissue]
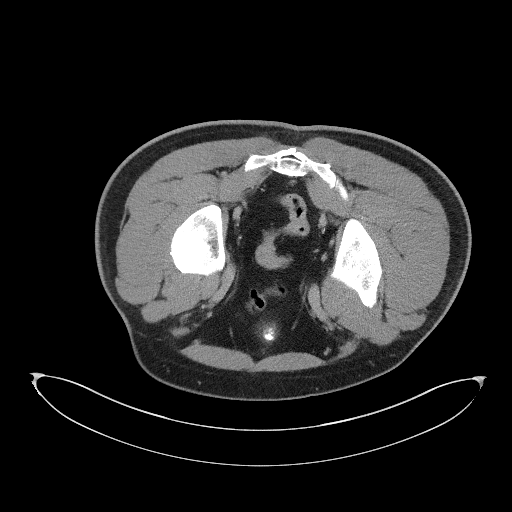
[im 43/113  soft-tissue]
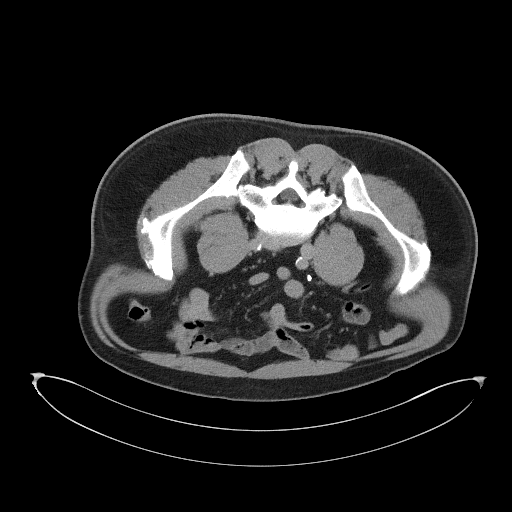
[im 57/113  soft-tissue]
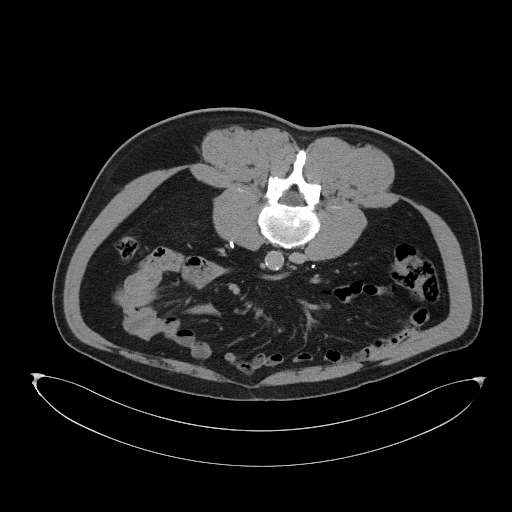
[im 57/113  lung]
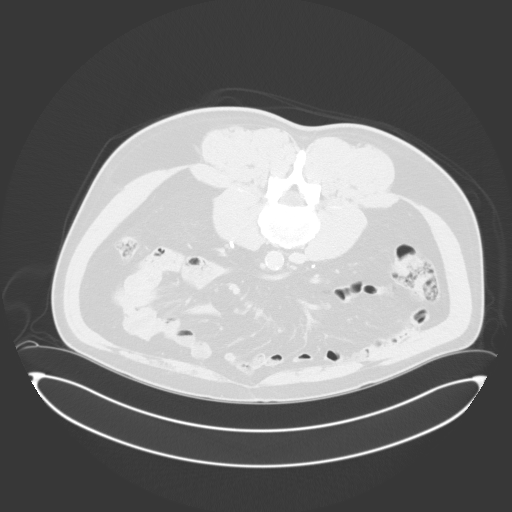
[im 71/113  soft-tissue]
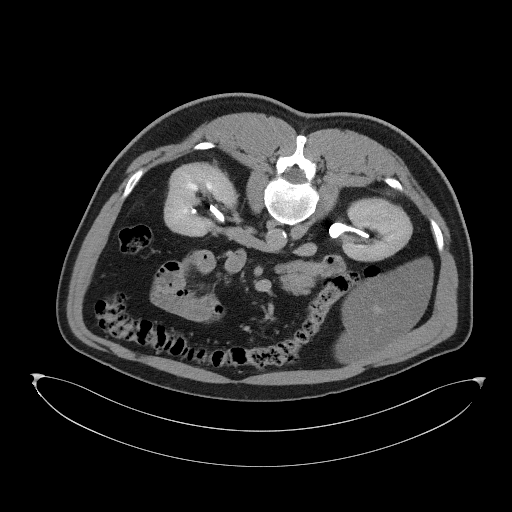
[im 71/113  lung]
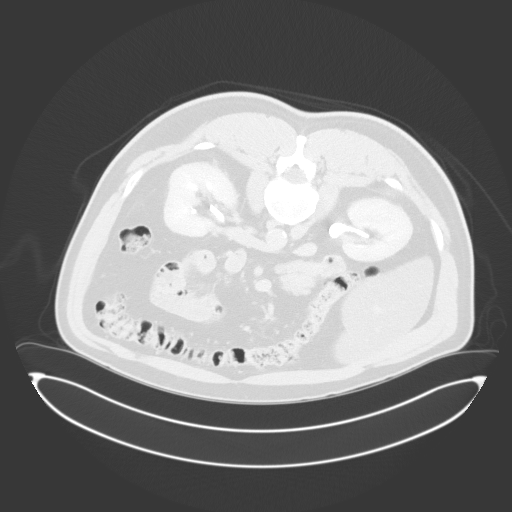
[im 85/113  soft-tissue]
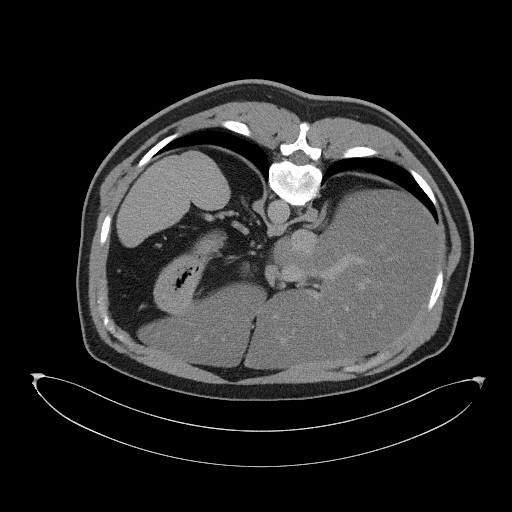
[im 85/113  lung]
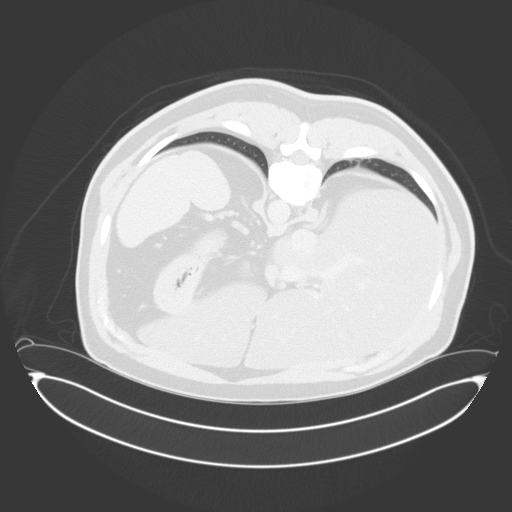
[im 99/113  soft-tissue]
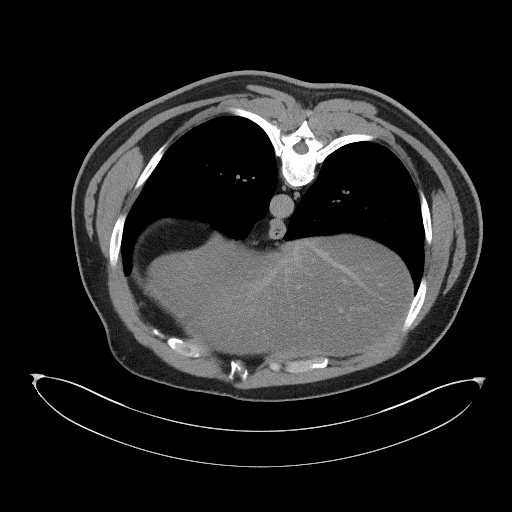
[im 99/113  lung]
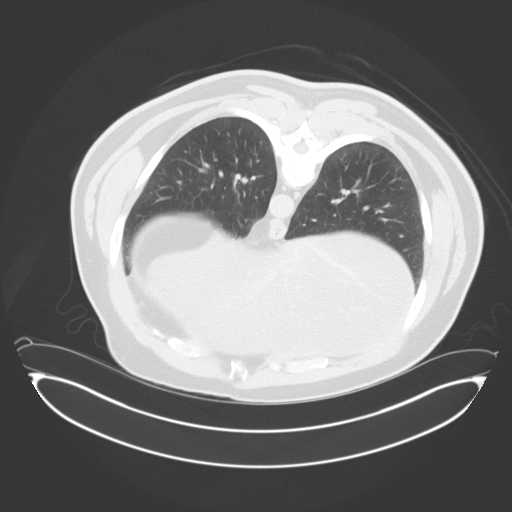

[13 of 46 positions shown; findings below may reference images not displayed]

FINDINGS: Lower chest: No acute findings.

Hepatobiliary: No mass visualized on this unenhanced exam. Severe
diffuse hepatic steatosis shows no significant change. Gallbladder
is unremarkable.

Pancreas: No mass or inflammatory process visualized on this
unenhanced exam.

Spleen:  Within normal limits in size.

Adrenals/Urinary tract: No evidence of urolithiasis or
hydronephrosis. Unremarkable unopacified urinary bladder.

Stomach/Bowel: No evidence of obstruction, inflammatory process, or
abnormal fluid collections. Normal appendix visualized.

Vascular/Lymphatic: No pathologically enlarged lymph nodes
identified. No evidence of abdominal aortic aneurysm. Aortic
atherosclerosis.

Reproductive:  No mass or other significant abnormality.

Other:  None.

Musculoskeletal:  No suspicious bone lesions identified.
IMPRESSION: No evidence of urolithiasis, hydronephrosis, or other acute
findings.

Stable severe hepatic steatosis.

Aortic atherosclerosis.

## 2019-10-23 ENCOUNTER — Ambulatory Visit: Payer: Self-pay | Admitting: Cardiology

## 2019-10-24 ENCOUNTER — Ambulatory Visit: Payer: Self-pay | Admitting: Cardiology

## 2019-10-30 DIAGNOSIS — I10 Essential (primary) hypertension: Secondary | ICD-10-CM | POA: Diagnosis not present

## 2019-10-30 DIAGNOSIS — E782 Mixed hyperlipidemia: Secondary | ICD-10-CM | POA: Diagnosis not present

## 2019-10-30 DIAGNOSIS — E1165 Type 2 diabetes mellitus with hyperglycemia: Secondary | ICD-10-CM | POA: Diagnosis not present

## 2019-11-18 ENCOUNTER — Other Ambulatory Visit: Payer: Self-pay | Admitting: Cardiology

## 2019-12-03 ENCOUNTER — Ambulatory Visit: Payer: BC Managed Care – PPO | Admitting: Cardiology

## 2019-12-03 ENCOUNTER — Encounter: Payer: Self-pay | Admitting: Cardiology

## 2019-12-03 VITALS — BP 131/83 | HR 91 | Temp 97.2°F | Ht 72.0 in | Wt 229.0 lb

## 2019-12-03 DIAGNOSIS — M79605 Pain in left leg: Secondary | ICD-10-CM

## 2019-12-03 DIAGNOSIS — I25119 Atherosclerotic heart disease of native coronary artery with unspecified angina pectoris: Secondary | ICD-10-CM

## 2019-12-03 DIAGNOSIS — I255 Ischemic cardiomyopathy: Secondary | ICD-10-CM

## 2019-12-03 DIAGNOSIS — I10 Essential (primary) hypertension: Secondary | ICD-10-CM | POA: Diagnosis not present

## 2019-12-03 DIAGNOSIS — E782 Mixed hyperlipidemia: Secondary | ICD-10-CM

## 2019-12-03 DIAGNOSIS — M79604 Pain in right leg: Secondary | ICD-10-CM

## 2019-12-03 NOTE — Progress Notes (Signed)
Cardiology Office Note  Date: 12/03/2019   ID: [name], DOB 1961-02-15, MRN [id number]  PCP:  Celene Squibb, MD  Cardiologist:  Rozann Lesches, MD Electrophysiologist:  None   Chief Complaint  Patient presents with  . Cardiac follow-up    History of Present Illness: [name] is a 59 y.o. male last seen by Ms. Strader PA-C in September 2020.  He presents for a routine visit.  He states that he has been doing reasonably well.  He was out of work from April through October of last year, but now has a steady job in a [employer].  He states that his angina has been better controlled since increase in Imdur.  He is following in the lipid clinic with history of mixed hyperlipidemia, severe hypertriglyceridemia.  He was doing very well on Repatha with triglycerides down from greater than 2000 range into the 500-600 range, although has not had a dose for a month since changing insurance coverage and no longer having the medication covered.  He continues on fenofibrate, Lovaza, and Crestor otherwise.  He does describe right hip and groin pain with ambulation, also some leg pain and feels like his legs are cold at nighttime, was concerned about PAD, but description sounds more like his hip joint.  He has not undergone any Doppler studies.  I went over his antianginal regiment and we will plan to continue with present therapy.  Past Medical History:  Diagnosis Date  . Coronary atherosclerosis of native coronary artery    a. remote LAD stent. b. subsequent stents to RCA with restenosis. c. DES to RCA and Circ 06/2007 in setting of restenosis. d. DES to LAD & RCA in 01/2010. e. DES to Cx and LAD; total occlusion of RCA, LVEF 40-45%.  . Diabetes mellitus type 2, diet-controlled ([place])   . Essential hypertension   . Hepatic steatosis   . Hyperlipidemia   . Ischemic cardiomyopathy   . MI (myocardial infarction) ([place])   . Obstructive sleep apnea   . Thrombocytopenia ([place])      Past Surgical History:  Procedure Laterality Date  . BIOPSY  05/04/2018   Procedure: BIOPSY;  Surgeon: Danie Binder, MD;  Location: [facility];  Service: Endoscopy;;  gastric  . CARDIAC CATHETERIZATION N/A 09/17/2015   Procedure: Left Heart Cath and Coronary Angiography;  Surgeon: [name], MD;  Location: [facility];  Service: Cardiovascular;  Laterality: N/A;  . COLONOSCOPY N/A 05/04/2018   Procedure: COLONOSCOPY;  Surgeon: Danie Binder, MD;  Location: [facility];  Service: Endoscopy;  Laterality: N/A;  11:00am  . CORONARY ANGIOPLASTY WITH STENT PLACEMENT    . left carpal tunnel    . POLYPECTOMY  05/04/2018   Procedure: POLYPECTOMY;  Surgeon: Danie Binder, MD;  Location: [facility];  Service: Endoscopy;;  ascending,rectal,gastric, duodenal,  . ROTATOR CUFF REPAIR    . WISDOM TOOTH EXTRACTION      Current Outpatient Medications  Medication Sig Dispense Refill  . aspirin 81 MG tablet Take 1 tablet (81 mg total) by mouth daily. 90 tablet 3  . Coenzyme Q10 (COQ-10) 200 MG CAPS Take 200 mg by mouth daily.     . fenofibrate 160 MG tablet Take 1 tablet (160 mg total) by mouth daily. 90 tablet 3  . isosorbide mononitrate (IMDUR) 60 MG 24 hr tablet Take 1 tablet (60 mg total) by mouth daily. 90 tablet 3  . MAGNESIUM GLUCONATE PO Take 100 mg  by mouth 2 (two) times daily.     . metoprolol succinate (TOPROL-XL) 25 MG 24 hr tablet TAKE 1 TABLET BY MOUTH IN THE MORNING AND 2 IN THE EVENING 270 tablet 1  . Multiple Vitamin (MULTIVITAMIN) tablet Take 1 tablet by mouth daily.      . nitroGLYCERIN (NITROSTAT) 0.4 MG SL tablet Place 1 tablet (0.4 mg total) under the tongue every 5 (five) minutes as needed. For chest pain (Patient taking differently: Place 0.4 mg under the tongue every 5 (five) minutes as needed for chest pain. ) 25 tablet 6  . omega-3 acid ethyl esters (LOVAZA) 1 g capsule Take 2 capsules (2 g total) by mouth 2 (two) times daily. 360 capsule 3  .  omeprazole (PRILOSEC) 20 MG capsule Take 1 capsule by mouth once daily 90 capsule 1  . prasugrel (EFFIENT) 10 MG TABS tablet Take 1 tablet (10 mg total) by mouth daily. 90 tablet 3  . REPATHA SURECLICK [id number] MG/ML SOAJ INJECT 1 SYRINGE ONCE EVERY 2 WEEKS    . rosuvastatin (CRESTOR) 20 MG tablet Take 1 tablet (20 mg total) by mouth daily. 30 tablet 3  . Semaglutide (RYBELSUS) 3 MG TABS Take 14 mg by mouth daily. 30 tablet 3  . SYNJARDY XR 25-1000 MG TB24 Take 1 tablet by mouth daily.    . TRESIBA FLEXTOUCH 100 UNIT/ML SOPN FlexTouch Pen INJECT 10 20 UNITS SUBCUTANEOUSLY ONCE DAILY    . valsartan-hydrochlorothiazide (DIOVAN-HCT) 320-25 MG tablet Take 1 tablet by mouth daily. 90 tablet 3   No current facility-administered medications for this visit.   Allergies:  Reopro [abciximab]   Social History: The patient  reports that he quit smoking about 19 years ago. His smoking use included cigarettes. He started smoking about 42 years ago. He has never used smokeless tobacco. He reports that he does not drink alcohol or use drugs.   ROS:  Please see the history of present illness. Otherwise, complete review of systems is positive for right hip and leg pain.  All other systems are reviewed and negative.   Physical Exam: VS:  BP 131/83   Pulse 91   Temp (!) 97.2 F (36.2 C)   Ht 6' (1.829 m)   Wt 229 lb (103.9 kg)   SpO2 98%   BMI 31.06 kg/m , BMI Body mass index is 31.06 kg/m.  Wt Readings from Last 3 Encounters:  12/03/19 229 lb (103.9 kg)  08/21/19 234 lb (106.1 kg)  12/13/18 242 lb (109.8 kg)    General: Patient appears comfortable at rest. HEENT: Conjunctiva and lids normal, wearing a mask. Neck: Supple, no elevated JVP or carotid bruits, no thyromegaly. Lungs: Clear to auscultation, nonlabored breathing at rest. Cardiac: Regular rate and rhythm, no S3 or significant systolic murmur. Abdomen: Soft, nontender, bowel sounds present. Extremities: No pitting edema, distal pulses  2+. Skin: Warm and dry. Musculoskeletal: No kyphosis. Neuropsychiatric: Alert and oriented x3, affect grossly appropriate.  ECG:  An ECG dated 08/21/2019 was personally reviewed today and demonstrated:  Sinus rhythm with old inferior apical infarct pattern.  Recent Labwork: 08/26/2019: ALT 46; AST 30     Component Value Date/Time   CHOL 103 08/26/2019 0838   TRIG 666 (HH) 08/26/2019 0838   HDL 29 (L) 08/26/2019 0838   CHOLHDL 3.6 08/26/2019 0838   CHOLHDL NOT REPORTED DUE TO HIGH TRIGLYCERIDES 04/17/2019 0827   VLDL NOT CALCULATED 04/17/2019 0827   LDLCALC Comment (A) 08/26/2019 0838   [place]  04/12/2019 1047  Comment:     . LDL cholesterol not calculated. Triglyceride levels greater than 400 mg/dL invalidate calculated LDL results. . Reference range: <100 . Desirable range <100 mg/dL for primary prevention;   <70 mg/dL for patients with CHD or diabetic patients  with > or = 2 CHD risk factors. [name] LDL-C is now calculated using the Martin-Hopkins  calculation, which is a validated novel method providing  better accuracy than the Friedewald equation in the  estimation of LDL-C.  Cresenciano Genre et al. Annamaria Helling. [id number]): 2061-2068  (http://education.QuestDiagnostics.com/faq/FAQ164)    LDLDIRECT 95.5 10/22/2007 0851    Other Studies Reviewed Today:  Echocardiogram 12/28/2018:  1. The left ventricle has mildly reduced systolic function of [id number]. The cavity size was normal. There is mildly increased left ventricular wall thickness. Echo evidence of impaired diastolic relaxation.  2. The right ventricle has normal systolic function. The cavity was normal. There is no increase in right ventricular wall thickness. Right ventricular systolic pressure could not be assessed.  3. The aortic valve is tricuspid There is mild aortic annular calcification noted.  4. The mitral valve is normal in structure.  5. The tricuspid valve is normal in structure.  6. The aortic root is normal in size  and structure.  7. There is akinesis of the apical inferior left ventricular segment.  8. There is akinesis of the mid-apical anteroseptal left ventricular segment.  Assessment and Plan:  1.  CAD status post multiple percutaneous coronary interventions, most recently DES to the circumflex and LAD with known occluded RCA as of 2016.  Continue medical therapy with stable angina symptoms.  He is on aspirin, Imdur, Toprol-XL, Effient, Diovan HCT, and statin therapy.  2.  Mixed hyperlipidemia with severe hypertriglyceridemia.  He has had evaluation to the lipid clinic and was previously doing better on combination of Crestor, Lovaza, fenofibrate, and Repatha.  He has since had a change in insurance and Repatha is no longer covered.  We will reach out to the lipid clinic to see if they can recommend other avenues.  Last dose of Repatha was 1 month ago.  3.  Right hip and groin pain, also lower leg discomfort and feeling of cold legs at nighttime.  Concerned that he may have right hip arthritis, but will assess for PAD with lower extremity arterial Dopplers and ABIs.  3.  Ischemic cardiomyopathy with LVEF 45 to 50%.  Medication Adjustments/Labs and Tests Ordered: Current medicines are reviewed at length with the patient today.  Concerns regarding medicines are outlined above.   Tests Ordered: Orders Placed This Encounter  Procedures  . VAS [place] ABI WITH/WO TBI    Medication Changes: No orders of the defined types were placed in this encounter.   Disposition:  Follow up 6 months in the [place] office.  Signed, Satira Sark, MD, [facility] 12/03/2019 4:55 PM    [facility] at [facility] [address], [place], [place] [address] Phone: [phone]; Fax: [phone]

## 2019-12-03 NOTE — Patient Instructions (Signed)
Medication Instructions:  Your physician recommends that you continue on your current medications as directed. Please refer to the Current Medication list given to you today.  *If you need a refill on your cardiac medications before your next appointment, please call your pharmacy*  Lab Work: NONE If you have labs (blood work) drawn today and your tests are completely normal, you will receive your results only by: [name] MyChart Message (if you have MyChart) OR . A paper copy in the mail If you have any lab test that is abnormal or we need to change your treatment, we will call you to review the results.  Testing/Procedures: Your physician has requested that you have a lower extremity arterial duplex. This test is an ultrasound of the arteries in the legs  It looks at arterial blood flow in the legs. Allow one hour for Lower  Arterial scans. There are no restrictions or special instructions  Follow-Up: At [facility], you and your health needs are our priority.  As part of our continuing mission to provide you with exceptional heart care, we have created designated Provider Care Teams.  These Care Teams include your primary Cardiologist (physician) and Advanced Practice Providers (APPs -  Physician Assistants and Nurse Practitioners) who all work together to provide you with the care you need, when you need it.  Your next appointment:   6 month(s)  The format for your next appointment:   In Person  Provider:   Rozann Lesches, MD  Other Instructions NONE     Thank you for choosing [facility] !

## 2019-12-04 ENCOUNTER — Telehealth: Payer: Self-pay

## 2019-12-04 NOTE — Telephone Encounter (Signed)
Lmomed the pt to contact us with up to date insurance information

## 2019-12-04 NOTE — Telephone Encounter (Signed)
ID [id number] BIN [id number] Pt called in insurance info and submitted pa for repatha

## 2019-12-04 NOTE — Telephone Encounter (Signed)
-----   Message from Rockne Menghini, [place] sent at 12/04/2019 10:15 AM EST ----- We need to find out his new 21 insurance to and file to continue [place].  Last dose was about a month ago.

## 2019-12-08 ENCOUNTER — Other Ambulatory Visit: Payer: Self-pay | Admitting: Cardiology

## 2019-12-11 ENCOUNTER — Other Ambulatory Visit: Payer: Self-pay | Admitting: Cardiology

## 2019-12-11 ENCOUNTER — Ambulatory Visit (HOSPITAL_COMMUNITY): Payer: BC Managed Care – PPO

## 2019-12-11 DIAGNOSIS — I739 Peripheral vascular disease, unspecified: Secondary | ICD-10-CM

## 2019-12-16 MED ORDER — REPATHA SURECLICK 140 MG/ML ~~LOC~~ SOAJ: 140.0000 mg | SUBCUTANEOUS | 11 refills | Status: DC

## 2019-12-16 NOTE — Addendum Note (Signed)
Addended by: Allean Found on: 12/16/2019 08:46 AM   Modules accepted: Orders

## 2020-01-22 ENCOUNTER — Ambulatory Visit (INDEPENDENT_AMBULATORY_CARE_PROVIDER_SITE_OTHER): Payer: BC Managed Care – PPO

## 2020-01-22 ENCOUNTER — Other Ambulatory Visit: Payer: Self-pay

## 2020-01-22 DIAGNOSIS — I739 Peripheral vascular disease, unspecified: Secondary | ICD-10-CM | POA: Diagnosis not present

## 2020-01-27 ENCOUNTER — Other Ambulatory Visit: Payer: Self-pay | Admitting: Cardiology

## 2020-01-27 DIAGNOSIS — Z23 Encounter for immunization: Secondary | ICD-10-CM | POA: Diagnosis not present

## 2020-02-19 ENCOUNTER — Other Ambulatory Visit: Payer: Self-pay | Admitting: Cardiology

## 2020-02-20 ENCOUNTER — Other Ambulatory Visit: Payer: Self-pay | Admitting: *Deleted

## 2020-02-20 ENCOUNTER — Other Ambulatory Visit: Payer: Self-pay

## 2020-02-20 MED ORDER — FENOFIBRATE 160 MG PO TABS: 160.0000 mg | ORAL_TABLET | Freq: Every day | ORAL | 3 refills | Status: DC

## 2020-02-20 MED ORDER — PRASUGREL HCL 10 MG PO TABS: 10.0000 mg | ORAL_TABLET | Freq: Every day | ORAL | 3 refills | Status: DC

## 2020-02-20 MED ORDER — VALSARTAN-HYDROCHLOROTHIAZIDE 320-25 MG PO TABS: 1.0000 | ORAL_TABLET | Freq: Every day | ORAL | 3 refills | Status: DC

## 2020-02-20 MED ORDER — ROSUVASTATIN CALCIUM 20 MG PO TABS: 20.0000 mg | ORAL_TABLET | Freq: Every day | ORAL | 3 refills | Status: DC

## 2020-02-20 MED ORDER — METOPROLOL SUCCINATE ER 25 MG PO TB24: ORAL_TABLET | ORAL | 3 refills | Status: DC

## 2020-02-20 MED ORDER — OMEPRAZOLE 20 MG PO CPDR: 20.0000 mg | DELAYED_RELEASE_CAPSULE | Freq: Every day | ORAL | 1 refills | Status: DC

## 2020-02-20 NOTE — Telephone Encounter (Signed)
Refilled fenofibrate, omeprazole, diovan/hctz, 90 day supply [facility]

## 2020-02-25 DIAGNOSIS — Z23 Encounter for immunization: Secondary | ICD-10-CM | POA: Diagnosis not present

## 2020-03-02 ENCOUNTER — Other Ambulatory Visit: Payer: Self-pay

## 2020-03-02 MED ORDER — ISOSORBIDE MONONITRATE ER 60 MG PO TB24: 60.0000 mg | ORAL_TABLET | Freq: Every day | ORAL | 3 refills | Status: DC

## 2020-03-02 NOTE — Telephone Encounter (Signed)
[facility] asked Pt to call office for refills. Isosorbide 60mg ,#90.  Or Please call Pt [phone]   Thanks renee

## 2020-04-15 ENCOUNTER — Other Ambulatory Visit: Payer: Self-pay | Admitting: Cardiology

## 2020-04-21 ENCOUNTER — Telehealth: Payer: Self-pay | Admitting: Cardiology

## 2020-04-21 MED ORDER — NITROGLYCERIN 0.4 MG SL SUBL: 0.4000 mg | SUBLINGUAL_TABLET | SUBLINGUAL | 6 refills | Status: DC | PRN

## 2020-04-21 NOTE — Telephone Encounter (Signed)
Refilled to [facility]

## 2020-04-21 NOTE — Telephone Encounter (Signed)
Needing refill Rx for nitroGLYCERIN (NITROSTAT) 0.4 MG SL tablet [id number]  Sent to [facility]-- pt washed his by accident

## 2020-04-28 ENCOUNTER — Telehealth: Payer: Self-pay

## 2020-04-28 NOTE — Telephone Encounter (Signed)
  Patient Consent for Virtual Visit         [name] has provided verbal consent on 04/28/2020 for a virtual visit (video or telephone).   CONSENT FOR VIRTUAL VISIT FOR:  [name]  By participating in this virtual visit I agree to the following:  I hereby voluntarily request, consent and authorize [facility] and its employed or contracted physicians, physician assistants, nurse practitioners or other licensed health care professionals (the Practitioner), to provide me with telemedicine health care services (the "Services") as deemed necessary by the treating Practitioner. I acknowledge and consent to receive the Services by the Practitioner via telemedicine. I understand that the telemedicine visit will involve communicating with the Practitioner through live audiovisual communication technology and the disclosure of certain medical information by electronic transmission. I acknowledge that I have been given the opportunity to request an in-person assessment or other available alternative prior to the telemedicine visit and am voluntarily participating in the telemedicine visit.  I understand that I have the right to withhold or withdraw my consent to the use of telemedicine in the course of my care at any time, without affecting my right to future care or treatment, and that the Practitioner or I may terminate the telemedicine visit at any time. I understand that I have the right to inspect all information obtained and/or recorded in the course of the telemedicine visit and may receive copies of available information for a reasonable fee.  I understand that some of the potential risks of receiving the Services via telemedicine include:  [name] Delay or interruption in medical evaluation due to technological equipment failure or disruption; . Information transmitted may not be sufficient (e.g. poor resolution of images) to allow for appropriate medical decision making by the Practitioner;  and/or  . In rare instances, security protocols could fail, causing a breach of personal health information.  Furthermore, I acknowledge that it is my responsibility to provide information about my medical history, conditions and care that is complete and accurate to the best of my ability. I acknowledge that Practitioner's advice, recommendations, and/or decision may be based on factors not within their control, such as incomplete or inaccurate data provided by me or distortions of diagnostic images or specimens that may result from electronic transmissions. I understand that the practice of medicine is not an exact science and that Practitioner makes no warranties or guarantees regarding treatment outcomes. I acknowledge that a copy of this consent can be made available to me via my patient portal ([facility]), or I can request a printed copy by calling the office of [facility].    I understand that my insurance will be billed for this visit.   I have read or had this consent read to me. . I understand the contents of this consent, which adequately explains the benefits and risks of the Services being provided via telemedicine.  . I have been provided ample opportunity to ask questions regarding this consent and the Services and have had my questions answered to my satisfaction. . I give my informed consent for the services to be provided through the use of telemedicine in my medical care

## 2020-04-30 ENCOUNTER — Encounter: Payer: Self-pay | Admitting: Cardiology

## 2020-04-30 ENCOUNTER — Telehealth (INDEPENDENT_AMBULATORY_CARE_PROVIDER_SITE_OTHER): Payer: BC Managed Care – PPO | Admitting: Cardiology

## 2020-04-30 VITALS — BP 133/82 | HR 73 | Ht 72.0 in | Wt 215.0 lb

## 2020-04-30 DIAGNOSIS — I25119 Atherosclerotic heart disease of native coronary artery with unspecified angina pectoris: Secondary | ICD-10-CM

## 2020-04-30 DIAGNOSIS — E782 Mixed hyperlipidemia: Secondary | ICD-10-CM

## 2020-04-30 MED ORDER — METOPROLOL SUCCINATE ER 25 MG PO TB24: 75.0000 mg | ORAL_TABLET | Freq: Every day | ORAL | 3 refills | Status: DC

## 2020-04-30 NOTE — Progress Notes (Signed)
Virtual Visit via Telephone Note   This visit type was conducted due to national recommendations for restrictions regarding the COVID-19 Pandemic (e.g. social distancing) in an effort to limit this patient's exposure and mitigate transmission in our community.  Due to his co-morbid illnesses, this patient is at least at moderate risk for complications without adequate follow up.  This format is felt to be most appropriate for this patient at this time.  The patient did not have access to video technology/had technical difficulties with video requiring transitioning to audio format only (telephone).  All issues noted in this document were discussed and addressed.  No physical exam could be performed with this format.  Please refer to the patient's chart for his  consent to telehealth for [facility].   The patient was identified using 2 identifiers.  Date:  04/30/2020   ID:  [name], DOB 1961-04-30, MRN [id number]  Patient Location: Home Provider Location: Home  PCP:  Celene Squibb, MD  Cardiologist:  Rozann Lesches, MD Electrophysiologist:  None   Evaluation Performed:  Follow-Up Visit  Chief Complaint:   Cardiac follow-up  History of Present Illness:    [name] is a 59 y.o. male last seen in January.  We spoke by phone today.  He reports stable angina symptoms, usually when he is under stress or if he happens to forget his medication (rarely).  Still working full-time.  I reviewed his current cardiac regimen which is outlined below.  He is back on Repatha, we discussed getting a follow-up fasting lipid profile.  No obvious intolerances.  ABIs in March were normal as outlined below.  He and his wife have both received the coronavirus vaccine.  Past Medical History:  Diagnosis Date  . Coronary atherosclerosis of native coronary artery    a. remote LAD stent. b. subsequent stents to RCA with restenosis. c. DES to RCA and Circ 06/2007 in setting of restenosis. d. DES to  LAD & RCA in 01/2010. e. DES to Cx and LAD; total occlusion of RCA, LVEF 40-45%.  . Diabetes mellitus type 2, diet-controlled ([place])   . Essential hypertension   . Hepatic steatosis   . Hyperlipidemia   . Ischemic cardiomyopathy   . MI (myocardial infarction) ([place])   . Obstructive sleep apnea   . Thrombocytopenia ([place])    Past Surgical History:  Procedure Laterality Date  . BIOPSY  05/04/2018   Procedure: BIOPSY;  Surgeon: Danie Binder, MD;  [facility];  Service: Endoscopy;;  gastric  . CARDIAC CATHETERIZATION N/A 09/17/2015   Procedure: Left Heart Cath and Coronary Angiography;  Surgeon: [name], MD;  Location: [facility];  Service: Cardiovascular;  Laterality: N/A;  . COLONOSCOPY N/A 05/04/2018   Procedure: COLONOSCOPY;  Surgeon: Danie Binder, MD;  [facility];  Service: Endoscopy;  Laterality: N/A;  11:00am  . CORONARY ANGIOPLASTY WITH STENT PLACEMENT    . left carpal tunnel    . POLYPECTOMY  05/04/2018   Procedure: POLYPECTOMY;  Surgeon: Danie Binder, MD;  [facility];  Service: Endoscopy;;  ascending,rectal,gastric, duodenal,  . ROTATOR CUFF REPAIR    . WISDOM TOOTH EXTRACTION       Current Meds  Medication Sig  . aspirin 81 MG tablet Take 1 tablet (81 mg total) by mouth daily.  . Coenzyme Q10 (COQ-10) 200 MG CAPS Take 200 mg by mouth daily.   . fenofibrate 160 MG tablet Take 1 tablet (160  mg total) by mouth daily.  . isosorbide mononitrate (IMDUR) 60 MG 24 hr tablet Take 1 tablet (60 mg total) by mouth daily.  [name] MAGNESIUM GLUCONATE PO Take 100 mg by mouth 2 (two) times daily.   . metoprolol succinate (TOPROL XL) 25 MG 24 hr tablet Take 3 tablets (75 mg total) by mouth daily.  . Multiple Vitamin (MULTIVITAMIN) tablet Take 1 tablet by mouth daily.    . nitroGLYCERIN (NITROSTAT) 0.4 MG SL tablet Place 1 tablet (0.4 mg total) under the tongue every 5 (five) minutes as needed. For chest pain  . omega-3 acid ethyl esters  (LOVAZA) 1 g capsule TAKE TWO CAPSULES BY MOUTH TWICE A DAY  . omeprazole (PRILOSEC) 20 MG capsule Take 1 capsule (20 mg total) by mouth daily.  . prasugrel (EFFIENT) 10 MG TABS tablet Take 1 tablet (10 mg total) by mouth daily.  [name] REPATHA SURECLICK 751 MG/ML SOAJ Inject 140 mg into the skin every 14 (fourteen) days.  . rosuvastatin (CRESTOR) 20 MG tablet Take 1 tablet (20 mg total) by mouth daily.  . Semaglutide (RYBELSUS) 3 MG TABS Take 14 mg by mouth daily.  [name] SYNJARDY XR 25-1000 MG TB24 Take 1 tablet by mouth daily.  . TRESIBA FLEXTOUCH 100 UNIT/ML SOPN FlexTouch Pen INJECT 10 20 UNITS SUBCUTANEOUSLY ONCE DAILY  . valsartan-hydrochlorothiazide (DIOVAN-HCT) 320-25 MG tablet Take 1 tablet by mouth daily.  . [DISCONTINUED] metoprolol succinate (TOPROL-XL) 50 MG 24 hr tablet Take 50 mg by mouth daily. Take with or immediately following a meal.     Allergies:   Reopro [abciximab]   ROS:   No palpitations or syncope.  Prior CV studies:   The following studies were reviewed today:  Echocardiogram 12/28/2018: 1. The left ventricle has mildly reduced systolic function of 70-01%. The cavity size was normal. There is mildly increased left ventricular wall thickness. Echo evidence of impaired diastolic relaxation. 2. The right ventricle has normal systolic function. The cavity was normal. There is no increase in right ventricular wall thickness. Right ventricular systolic pressure could not be assessed. 3. The aortic valve is tricuspid There is mild aortic annular calcification noted. 4. The mitral valve is normal in structure. 5. The tricuspid valve is normal in structure. 6. The aortic root is normal in size and structure. 7. There is akinesis of the apical inferior left ventricular segment. 8. There is akinesis of the mid-apical anteroseptal left ventricular segment.  Bilateral ABI 01/22/2020: Summary:  Right: Resting right ankle-brachial index is within normal range.  No evidence of  significant right lower extremity arterial disease. The  right toe-brachial index is normal.   Left: Resting left ankle-brachial index is within normal range.  No evidence of significant left lower extremity arterial disease. The left  toe-brachial index is normal.   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  Recent Labs: 08/26/2019: ALT 46  October 2020: Cholesterol 103, triglycerides 666, HDL 29, LDL not calculated  Wt Readings from Last 3 Encounters:  04/30/20 215 lb (97.5 kg)  12/03/19 229 lb (103.9 kg)  08/21/19 234 lb (106.1 kg)     Objective:    Vital Signs:  BP 133/82   Pulse 73   Ht 6' (1.829 m)   Wt 215 lb (97.5 kg)   BMI 29.16 kg/m    Patient spoke in full sentences, not short of breath. No audible wheezing  or coughing.  ASSESSMENT & PLAN:    1.  Symptomatically stable CAD status post multiple PCI as outlined above, most recently DES to the circumflex and LAD with known occlusion of the RCA in 2016.  LVEF 45 to 50% range.  Continue medical therapy including aspirin, Effient, Toprol-XL, Imdur, Diovan HCT, and statin.  2.  Mixed hyperlipidemia with severe hypertriglyceridemia.  He is now back on Repatha and also continues on Crestor, Lovaza, and fenofibrate.  Check FLP.   Time:   Today, I have spent 6 minutes with the patient with telehealth technology discussing the above problems.     Medication Adjustments/Labs and Tests Ordered: Current medicines are reviewed at length with the patient today.  Concerns regarding medicines are outlined above.   Tests Ordered: Orders Placed This Encounter  Procedures  . Lipid Profile    Medication Changes: Meds ordered this encounter  Medications  . metoprolol succinate (TOPROL XL) 25 MG 24 hr tablet    Sig: Take 3 tablets (75 mg total) by mouth daily.    Dispense:  270 tablet    Refill:  3     Follow Up:  In Person 6 months in the [place] office.  Signed, Rozann Lesches, MD  04/30/2020 9:15 AM    [place]

## 2020-04-30 NOTE — Patient Instructions (Signed)
Medication Instructions:  Your physician recommends that you continue on your current medications as directed. Please refer to the Current Medication list given to you today.  *If you need a refill on your cardiac medications before your next appointment, please call your pharmacy*   Lab Work: None today If you have labs (blood work) drawn today and your tests are completely normal, you will receive your results only by: [name] MyChart Message (if you have MyChart) OR . A paper copy in the mail If you have any lab test that is abnormal or we need to change your treatment, we will call you to review the results.   Testing/Procedures: Fasting Lipids   Follow-Up: At [facility], you and your health needs are our priority.  As part of our continuing mission to provide you with exceptional heart care, we have created designated Provider Care Teams.  These Care Teams include your primary Cardiologist (physician) and Advanced Practice Providers (APPs -  Physician Assistants and Nurse Practitioners) who all work together to provide you with the care you need, when you need it.  We recommend signing up for the patient portal called "MyChart".  Sign up information is provided on this After Visit Summary.  MyChart is used to connect with patients for Virtual Visits (Telemedicine).  Patients are able to view lab/test results, encounter notes, upcoming appointments, etc.  Non-urgent messages can be sent to your provider as well.   To learn more about what you can do with MyChart, go to [email or web address].    Your next appointment:   6 month(s)  The format for your next appointment:   In Person  Provider:   Rozann Lesches, MD   Other Instructions None      Thank you for choosing [facility] !

## 2020-05-12 ENCOUNTER — Telehealth: Payer: Self-pay

## 2020-05-12 NOTE — Telephone Encounter (Signed)
-----   Message from Rockne Menghini, [place] sent at 05/12/2020 12:47 PM EDT ----- Chrystine Oiler a call from his insurance.  Looks like you did a re-authorization of his Repatha, but he needs to have labs proving benefit.  Last labs were in 2020, and he was approved in Jan 21.  Once you get him to get these drawn, you can fax the results to his insurance at [place]

## 2020-05-12 NOTE — Telephone Encounter (Signed)
Called and lmomed/pt stating that they needed fasting lipids done asap for their repatha so the insurance prior authorization will go thru

## 2020-05-14 ENCOUNTER — Other Ambulatory Visit: Payer: Self-pay

## 2020-05-14 ENCOUNTER — Other Ambulatory Visit (HOSPITAL_COMMUNITY)
Admission: RE | Admit: 2020-05-14 | Discharge: 2020-05-14 | Disposition: A | Discharge status: Home / Self Care | Payer: BC Managed Care – PPO | Source: Ambulatory Visit | Attending: Cardiology | Admitting: Cardiology

## 2020-05-14 DIAGNOSIS — E782 Mixed hyperlipidemia: Secondary | ICD-10-CM | POA: Insufficient documentation

## 2020-05-14 LAB — LIPID PANEL
Cholesterol: 92 mg/dL (ref 0–200)
HDL: 29 mg/dL — ABNORMAL LOW (ref >40)
LDL Cholesterol: UNDETERMINED mg/dL (ref 0–99)
Total CHOL/HDL Ratio: 3.2 RATIO
Triglycerides: 527 mg/dL — ABNORMAL HIGH (ref <150)
VLDL: UNDETERMINED mg/dL (ref 0–40)

## 2020-05-14 LAB — LDL CHOLESTEROL, DIRECT: Direct LDL: 10.8 mg/dL (ref 0–99)

## 2020-06-24 DIAGNOSIS — G4489 Other headache syndrome: Secondary | ICD-10-CM | POA: Diagnosis not present

## 2020-06-24 DIAGNOSIS — Z20828 Contact with and (suspected) exposure to other viral communicable diseases: Secondary | ICD-10-CM | POA: Diagnosis not present

## 2020-06-24 DIAGNOSIS — M791 Myalgia, unspecified site: Secondary | ICD-10-CM | POA: Diagnosis not present

## 2020-08-22 ENCOUNTER — Other Ambulatory Visit: Payer: Self-pay | Admitting: Cardiology

## 2020-09-03 DIAGNOSIS — R945 Abnormal results of liver function studies: Secondary | ICD-10-CM | POA: Diagnosis not present

## 2020-09-03 DIAGNOSIS — E782 Mixed hyperlipidemia: Secondary | ICD-10-CM | POA: Diagnosis not present

## 2020-09-03 DIAGNOSIS — I10 Essential (primary) hypertension: Secondary | ICD-10-CM | POA: Diagnosis not present

## 2020-09-03 DIAGNOSIS — E6609 Other obesity due to excess calories: Secondary | ICD-10-CM | POA: Diagnosis not present

## 2020-09-03 DIAGNOSIS — E1165 Type 2 diabetes mellitus with hyperglycemia: Secondary | ICD-10-CM | POA: Diagnosis not present

## 2020-09-08 DIAGNOSIS — E782 Mixed hyperlipidemia: Secondary | ICD-10-CM | POA: Diagnosis not present

## 2020-09-08 DIAGNOSIS — Z23 Encounter for immunization: Secondary | ICD-10-CM | POA: Diagnosis not present

## 2020-09-08 DIAGNOSIS — I10 Essential (primary) hypertension: Secondary | ICD-10-CM | POA: Diagnosis not present

## 2020-09-08 DIAGNOSIS — E1165 Type 2 diabetes mellitus with hyperglycemia: Secondary | ICD-10-CM | POA: Diagnosis not present

## 2020-10-26 ENCOUNTER — Encounter: Payer: Self-pay | Admitting: Cardiology

## 2020-11-01 NOTE — Progress Notes (Signed)
Cardiology Office Note  Date: 11/02/2020   ID: [name], DOB [birth year], MRN [id number]  PCP:  Celene Squibb, MD  Cardiologist:  Rozann Lesches, MD Electrophysiologist:  None   Chief Complaint  Patient presents with  . Follow-up    6 mth     History of Present Illness: [name] is a 60 y.o. male last seen in June.  He is here for a routine visit.  Reports stable exertional angina on current medical regimen, no increasing nitroglycerin use.  He continues to work full-time, does have some time off of the holidays.  Lab work from October is outlined below.  I went over his medications which are stable from a cardiac perspective and reviewed below.  Today's ECG shows sinus rhythm with old inferior infarct pattern, borderline low voltage in the limb leads.  Past Medical History:  Diagnosis Date  . Coronary atherosclerosis of native coronary artery    a. remote LAD stent. b. subsequent stents to RCA with restenosis. c. DES to RCA and Circ 06/2007 in setting of restenosis. d. DES to LAD & RCA in 01/2010. e. DES to Cx and LAD; total occlusion of RCA, LVEF 40-45%.  . Diabetes mellitus type 2, diet-controlled ([place])   . Essential hypertension   . Hepatic steatosis   . Hyperlipidemia   . Ischemic cardiomyopathy   . MI (myocardial infarction) ([place])   . Obstructive sleep apnea   . Thrombocytopenia ([place])     Past Surgical History:  Procedure Laterality Date  . BIOPSY  05/04/2018   Procedure: BIOPSY;  Surgeon: Danie Binder, MD;  [facility];  Service: Endoscopy;;  gastric  . CARDIAC CATHETERIZATION N/A 09/17/2015   Procedure: Left Heart Cath and Coronary Angiography;  Surgeon: [name], MD;  Location: [facility];  Service: Cardiovascular;  Laterality: N/A;  . COLONOSCOPY N/A 05/04/2018   Procedure: COLONOSCOPY;  Surgeon: Danie Binder, MD;  [facility];  Service: Endoscopy;  Laterality: N/A;  11:00am  . CORONARY ANGIOPLASTY WITH STENT  PLACEMENT    . left carpal tunnel    . POLYPECTOMY  05/04/2018   Procedure: POLYPECTOMY;  Surgeon: Danie Binder, MD;  [facility];  Service: Endoscopy;;  ascending,rectal,gastric, duodenal,  . ROTATOR CUFF REPAIR    . WISDOM TOOTH EXTRACTION      Current Outpatient Medications  Medication Sig Dispense Refill  . aspirin 81 MG tablet Take 1 tablet (81 mg total) by mouth daily. 90 tablet 3  . Coenzyme Q10 (COQ-10) 200 MG CAPS Take 200 mg by mouth daily.     . fenofibrate 160 MG tablet Take 1 tablet (160 mg total) by mouth daily. 90 tablet 3  . Insulin Pen Needle 32G X 4 MM MISC 1 each by Does not apply route as directed.    . isosorbide mononitrate (IMDUR) 60 MG 24 hr tablet Take 1 tablet (60 mg total) by mouth daily. 90 tablet 3  . MAGNESIUM GLUCONATE PO Take 100 mg by mouth 2 (two) times daily.     . metoprolol succinate (TOPROL XL) 25 MG 24 hr tablet Take 3 tablets (75 mg total) by mouth daily. 270 tablet 3  . Multiple Vitamin (MULTIVITAMIN) tablet Take 1 tablet by mouth daily.    [name] omega-3 acid ethyl esters (LOVAZA) 1 g capsule TAKE TWO CAPSULES BY MOUTH TWICE A DAY 360 capsule 2  . omeprazole (PRILOSEC) 20 MG capsule TAKE 1 CAPSULE BY MOUTH  DAILY 90 capsule 3  . prasugrel (EFFIENT) 10 MG TABS tablet Take 1 tablet (10 mg total) by mouth daily. 90 tablet 3  . REPATHA SURECLICK 735 MG/ML SOAJ Inject 140 mg into the skin every 14 (fourteen) days. 2 pen 11  . rosuvastatin (CRESTOR) 20 MG tablet Take 1 tablet (20 mg total) by mouth daily. 90 tablet 3  . RYBELSUS 14 MG TABS Take 1 tablet by mouth every morning.    [name] SYNJARDY XR 25-1000 MG TB24 Take 1 tablet by mouth daily.    . traMADol (ULTRAM) 50 MG tablet Take 50 mg by mouth 3 (three) times daily as needed.    . TRESIBA FLEXTOUCH 100 UNIT/ML SOPN FlexTouch Pen INJECT 10 20 UNITS SUBCUTANEOUSLY ONCE DAILY    . valsartan-hydrochlorothiazide (DIOVAN-HCT) 320-25 MG tablet Take 1 tablet by mouth daily. 90 tablet 3  . nitroGLYCERIN  (NITROSTAT) 0.4 MG SL tablet Place 1 tablet (0.4 mg total) under the tongue every 5 (five) minutes x 3 doses as needed for chest pain (if no relief afte 2nd dose, proceed to the ED for an evaluation or call 911). For chest pain 100 tablet 1   No current facility-administered medications for this visit.   Allergies:  Reopro [abciximab]   ROS: Erectile dysfunction.  Physical Exam: VS:  BP 110/70   Pulse 78   Resp 16   Ht 6' (1.829 m)   Wt 225 lb 9.6 oz (102.3 kg)   SpO2 99%   BMI 30.60 kg/m , BMI Body mass index is 30.6 kg/m.  Wt Readings from Last 3 Encounters:  11/02/20 225 lb 9.6 oz (102.3 kg)  04/30/20 215 lb (97.5 kg)  12/03/19 229 lb (103.9 kg)    General: Patient appears comfortable at rest. HEENT: Conjunctiva and lids normal, wearing a mask. Neck: Supple, no elevated JVP or carotid bruits, no thyromegaly. Lungs: Clear to auscultation, nonlabored breathing at rest. Cardiac: Regular rate and rhythm, no S3 or significant systolic murmur, no pericardial rub. Abdomen: Soft, nontender, bowel sounds present. Extremities: No pitting edema.  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  Recent Labwork:  October 2021: Hemoglobin 17.5, platelets 140, BUN 18, creatinine 0.86, potassium 3.9, AST 34, ALT 63, cholesterol 67, triglycerides 457, HDL 28, LDL not calculated, hemoglobin A1c 6.8%  Other Studies Reviewed Today:  Echocardiogram 12/28/2018: 1. The left ventricle has mildly reduced systolic function of 32-99%. The cavity size was normal. There is mildly increased left ventricular wall thickness. Echo evidence of impaired diastolic relaxation. 2. The right ventricle has normal systolic function. The cavity was normal. There is no increase in right ventricular wall thickness. Right ventricular systolic pressure could not be assessed. 3. The aortic valve is tricuspid There is mild aortic annular  calcification noted. 4. The mitral valve is normal in structure. 5. The tricuspid valve is normal in structure. 6. The aortic root is normal in size and structure. 7. There is akinesis of the apical inferior left ventricular segment. 8. There is akinesis of the mid-apical anteroseptal left ventricular segment.  Bilateral ABI 01/22/2020: Summary:  Right: Resting right ankle-brachial index is within normal range.  No evidence of significant right lower extremity arterial disease. The  right toe-brachial index is normal.   Left: Resting left ankle-brachial index is within normal range.  No evidence of significant left lower extremity arterial disease. The left  toe-brachial index is normal.   Assessment and Plan:  1.  CAD status post multiple PCI, most recently DES to the circumflex and LAD with known occlusion of the RCA in 2016.  He reports stable angina symptoms, no increasing nitroglycerin use.  ECG reviewed.  Continue observation on medical therapy.  He is on aspirin, Imdur, Toprol-XL, Effient, Diovan HCTZ, and Crestor.  2.  Mixed hyperlipidemia with severe hypertriglyceridemia.  He continues on multimodal therapy including Crestor, Repatha, Lovaza, and fenofibrate.  Most recent lipid panel is noted above.  3.  Ischemic cardiomyopathy, LVEF mildly reduced at 45 to 50%.  Clinically stable without evidence of fluid overload.  Medication Adjustments/Labs and Tests Ordered: Current medicines are reviewed at length with the patient today.  Concerns regarding medicines are outlined above.   Tests Ordered: No orders of the defined types were placed in this encounter.   Medication Changes: Meds ordered this encounter  Medications  . nitroGLYCERIN (NITROSTAT) 0.4 MG SL tablet    Sig: Place 1 tablet (0.4 mg total) under the tongue every 5 (five) minutes x 3 doses as needed for chest pain (if no relief afte 2nd dose, proceed to the ED for an evaluation or call 911). For chest pain     Dispense:  100 tablet    Refill:  1    Disposition:  Follow up 6 months.  Signed, Satira Sark, MD, [facility] 11/02/2020 8:44 AM    [facility] [place], [place], [place] [address] Phone: [phone]; Fax: [phone]

## 2020-11-02 ENCOUNTER — Other Ambulatory Visit: Payer: Self-pay

## 2020-11-02 ENCOUNTER — Encounter: Payer: Self-pay | Admitting: Cardiology

## 2020-11-02 ENCOUNTER — Ambulatory Visit (INDEPENDENT_AMBULATORY_CARE_PROVIDER_SITE_OTHER): Payer: BC Managed Care – PPO | Admitting: Cardiology

## 2020-11-02 VITALS — BP 110/70 | HR 78 | Resp 16 | Ht 72.0 in | Wt 225.6 lb

## 2020-11-02 DIAGNOSIS — I255 Ischemic cardiomyopathy: Secondary | ICD-10-CM | POA: Diagnosis not present

## 2020-11-02 DIAGNOSIS — I25119 Atherosclerotic heart disease of native coronary artery with unspecified angina pectoris: Secondary | ICD-10-CM | POA: Diagnosis not present

## 2020-11-02 DIAGNOSIS — E782 Mixed hyperlipidemia: Secondary | ICD-10-CM

## 2020-11-02 MED ORDER — NITROGLYCERIN 0.4 MG SL SUBL: 0.4000 mg | SUBLINGUAL_TABLET | SUBLINGUAL | 1 refills | Status: DC | PRN

## 2020-11-02 NOTE — Addendum Note (Signed)
Addended by: Julian Hy T on: 11/02/2020 09:41 AM   Modules accepted: Orders

## 2020-11-02 NOTE — Patient Instructions (Addendum)

## 2020-11-17 DIAGNOSIS — E782 Mixed hyperlipidemia: Secondary | ICD-10-CM | POA: Diagnosis not present

## 2020-11-17 DIAGNOSIS — G4762 Sleep related leg cramps: Secondary | ICD-10-CM | POA: Diagnosis not present

## 2020-11-17 DIAGNOSIS — I10 Essential (primary) hypertension: Secondary | ICD-10-CM | POA: Diagnosis not present

## 2020-11-17 DIAGNOSIS — E1165 Type 2 diabetes mellitus with hyperglycemia: Secondary | ICD-10-CM | POA: Diagnosis not present

## 2020-11-19 DIAGNOSIS — J069 Acute upper respiratory infection, unspecified: Secondary | ICD-10-CM | POA: Diagnosis not present

## 2020-11-20 DIAGNOSIS — H40013 Open angle with borderline findings, low risk, bilateral: Secondary | ICD-10-CM | POA: Diagnosis not present

## 2020-11-20 DIAGNOSIS — H25013 Cortical age-related cataract, bilateral: Secondary | ICD-10-CM | POA: Diagnosis not present

## 2020-11-20 DIAGNOSIS — H2513 Age-related nuclear cataract, bilateral: Secondary | ICD-10-CM | POA: Diagnosis not present

## 2020-11-20 DIAGNOSIS — E119 Type 2 diabetes mellitus without complications: Secondary | ICD-10-CM | POA: Diagnosis not present

## 2020-12-06 ENCOUNTER — Other Ambulatory Visit: Payer: Self-pay | Admitting: Cardiology

## 2020-12-09 ENCOUNTER — Ambulatory Visit (INDEPENDENT_AMBULATORY_CARE_PROVIDER_SITE_OTHER): Payer: No Typology Code available for payment source | Admitting: Orthopedic Surgery

## 2020-12-09 ENCOUNTER — Ambulatory Visit (INDEPENDENT_AMBULATORY_CARE_PROVIDER_SITE_OTHER): Payer: No Typology Code available for payment source

## 2020-12-09 DIAGNOSIS — M25511 Pain in right shoulder: Secondary | ICD-10-CM

## 2020-12-11 ENCOUNTER — Telehealth: Payer: Self-pay

## 2020-12-11 MED ORDER — PRALUENT 150 MG/ML ~~LOC~~ SOAJ: 150.0000 mg | SUBCUTANEOUS | 11 refills | Status: DC

## 2020-12-11 NOTE — Telephone Encounter (Signed)
Called and spokek w/pt regarding switching from repatha to praluent due to insurance, rx sent, pt voiced understanding, instructed pt to apply for copay card and informed him if he has issues to call us back and I would be happy to help

## 2020-12-13 ENCOUNTER — Encounter: Payer: Self-pay | Admitting: Orthopedic Surgery

## 2020-12-13 NOTE — Progress Notes (Signed)
Office Visit Note   Patient: [name]           Date of Birth: 04-22-1961           MRN: [id number] Visit Date: 12/09/2020 Requested by: Celene Squibb, MD [place],  [place] [address] PCP: Celene Squibb, MD  Subjective: Chief Complaint  Patient presents with  . Right Shoulder - Pain    HPI: [name] is a 60 y.o. male who presents to the office complaining of right shoulder pain.  Patient has history of chronic pain for several years.  He has been significantly worse in the last 2 months and "terrible" in the last 3 weeks.  He complains of lateral right shoulder pain with no significant radiation.  No neck symptoms.  Pain is worse at night and with lifting away from his body.  He has difficulty reaching across his body to shave the left part of his face.  No numbness and tingling in the arm.  He does note subjective weakness of the arm.  No significant pain with passive range of motion of the right shoulder when he uses his other arm to lift his right arm.  He has been taking Tylenol and tramadol without any relief.  He does have history of left rotator cuff repair in 2008 by Dr. Marlou Sa.  He is doing well from that but he does note that his current right shoulder pain "feels similar" to his left rotator cuff injury.  No history of right shoulder surgery.  In his free time he likes to play with his grandchildren and ride a motorcycle.  His job is very physical and involves lifting at least 50 pounds.  He does note it is okay if he lifts close to his body with his biceps primarily.                ROS: All systems reviewed are negative as they relate to the chief complaint within the history of present illness.  Patient denies fevers or chills.  Assessment & Plan: Visit Diagnoses:  1. Right shoulder pain, unspecified chronicity     Plan: Patient is a 60 year old male who presents complaint of right shoulder pain.  Patient has a history of several years of right shoulder  pain but it has been significantly worse especially over the last 3 to 4 weeks.  He has now waking with sleep.  He has difficulty lifting away from his body.  It feels similar to prior left-sided rotator cuff injury that he has had in the past.  Radiographs of the right shoulder are unremarkable today.  He does have good strength of the rotator cuff on exam today but he has significant pain when checking the rotator cuff strength.  Plan to order MRI arthrogram of the right shoulder to evaluate for rotator cuff tear.  Follow-up after MRI to review results.  Follow-Up Instructions: No follow-ups on file.   Orders:  Orders Placed This Encounter  Procedures  . XR Shoulder Right  . MR SHOULDER RIGHT W CONTRAST  . Arthrogram   No orders of the defined types were placed in this encounter.     Procedures: No procedures performed   Clinical Data: No additional findings.  Objective: Vital Signs: There were no vitals taken for this visit.  Physical Exam:  Constitutional: Patient appears well-developed HEENT:  Head: Normocephalic Eyes:EOM are normal Neck: Normal range of motion Cardiovascular: Normal rate Pulmonary/chest: Effort normal Neurologic: Patient  is alert Skin: Skin is warm Psychiatric: Patient has normal mood and affect  Ortho Exam: Ortho exam demonstrates right shoulder with 50 degrees external rotation, 95 degrees abduction, 170 degrees forward flexion.  5/5 motor strength of supraspinatus and subscapularis.  5 -/5 strength of infraspinatus with mildly decreased external rotation strength.  External rotation of the right shoulder causes severe pain as well as resisted supraspinatus strength testing.  No tenderness throughout the axial cervical spine.  No pain with cervical spine range of motion.  5/5 motor strength of bilateral grip strength, finger abduction, pronation/supination, bicep, tricep, deltoid.  No tenderness over the [facility] joint.  No pain with crossarm  adduction.  Specialty Comments:  No specialty comments available.  Imaging: No results found.   PMFS History: Patient Active Problem List   Diagnosis Date Noted  . Encounter for screening colonoscopy 03/22/2018  . Fatty liver 03/22/2018  . GERD (gastroesophageal reflux disease) 03/22/2018  . CAD S/P percutaneous coronary angioplasty 09/18/2015  . Hyperlipidemia 09/18/2015  . Thrombocytopenia ([place]) 09/18/2015  . Diabetes mellitus type 2, diet-controlled ([place])   . Ischemic cardiomyopathy   . Ischemic chest pain ([place]) 09/17/2015  . Exertional angina ([place]) 09/17/2015  . Type 2 diabetes mellitus ([place]) 01/21/2014  . Preoperative cardiovascular examination 07/03/2013  . Essential hypertension 01/29/2010  . ANGINA, STABLE 09/01/2009  . CORONARY ATHEROSCLEROSIS NATIVE CORONARY ARTERY 09/01/2009  . DYSLIPIDEMIA 08/31/2009   Past Medical History:  Diagnosis Date  . Coronary atherosclerosis of native coronary artery    a. remote LAD stent. b. subsequent stents to RCA with restenosis. c. DES to RCA and Circ 06/2007 in setting of restenosis. d. DES to LAD & RCA in 01/2010. e. DES to Cx and LAD; total occlusion of RCA, LVEF 40-45%.  . Diabetes mellitus type 2, diet-controlled ([place])   . Essential hypertension   . Hepatic steatosis   . Hyperlipidemia   . Ischemic cardiomyopathy   . MI (myocardial infarction) ([place])   . Obstructive sleep apnea   . Thrombocytopenia ([place])     Family History  Problem Relation Age of Onset  . Coronary artery disease Other   . Hypertension Mother   . Diabetes Mother   . Heart attack Mother   . Diabetes Father   . CVA Father   . Diabetes Brother   . Hypertension Brother   . Colon cancer Neg Hx   . Colon polyps Neg Hx     Past Surgical History:  Procedure Laterality Date  . BIOPSY  05/04/2018   Procedure: BIOPSY;  Surgeon: Danie Binder, MD;  [facility];  Service: Endoscopy;;  gastric  . CARDIAC CATHETERIZATION N/A 09/17/2015    Procedure: Left Heart Cath and Coronary Angiography;  Surgeon: [name], MD;  Location: [place] CV LAB;  Service: Cardiovascular;  Laterality: N/A;  . COLONOSCOPY N/A 05/04/2018   Procedure: COLONOSCOPY;  Surgeon: Danie Binder, MD;  [facility];  Service: Endoscopy;  Laterality: N/A;  11:00am  . CORONARY ANGIOPLASTY WITH STENT PLACEMENT    . left carpal tunnel    . POLYPECTOMY  05/04/2018   Procedure: POLYPECTOMY;  Surgeon: Danie Binder, MD;  [facility];  Service: Endoscopy;;  ascending,rectal,gastric, duodenal,  . ROTATOR CUFF REPAIR    . WISDOM TOOTH EXTRACTION     Social History   Occupational History  . 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 Smoking status: Former Smoker    Types: Cigarettes  Start date: 11/21/1977    Quit date: 08/04/2000    Years since quitting: 20.3  . Smokeless tobacco: Never Used  . Tobacco comment: Tobacco - no  Vaping Use  . Vaping Use: Never used  Substance and Sexual Activity  . Alcohol use: No    Alcohol/week: 0.0 standard drinks    Comment: rare, maybe 1 beer once a month   . Drug use: No  . Sexual activity: Not on file

## 2021-01-01 ENCOUNTER — Ambulatory Visit
Admission: RE | Admit: 2021-01-01 | Discharge: 2021-01-01 | Disposition: A | Discharge status: Home / Self Care | Payer: No Typology Code available for payment source | Source: Ambulatory Visit | Attending: Orthopedic Surgery | Admitting: Orthopedic Surgery

## 2021-01-01 ENCOUNTER — Other Ambulatory Visit: Payer: Self-pay | Admitting: Orthopedic Surgery

## 2021-01-01 ENCOUNTER — Other Ambulatory Visit: Payer: Self-pay

## 2021-01-01 ENCOUNTER — Telehealth: Payer: Self-pay | Admitting: Orthopedic Surgery

## 2021-01-01 DIAGNOSIS — Z77018 Contact with and (suspected) exposure to other hazardous metals: Secondary | ICD-10-CM

## 2021-01-01 DIAGNOSIS — M25511 Pain in right shoulder: Secondary | ICD-10-CM

## 2021-01-01 MED ORDER — IOPAMIDOL (ISOVUE-M 200) INJECTION 41%
13.0000 mL | Freq: Once | INTRAMUSCULAR | Status: AC
  Administered 2021-01-01: 13 mL via INTRA_ARTICULAR

## 2021-01-01 NOTE — Telephone Encounter (Signed)
Pt would like to know if you can squeeze him in sooner than 01/20/21. Please give him a call or let me know and ill call him!

## 2021-01-01 NOTE — Telephone Encounter (Signed)
Called patient to let him know we have availability on 01/18/2021. No answer. LMOM.

## 2021-01-17 ENCOUNTER — Other Ambulatory Visit: Payer: Self-pay | Admitting: Cardiology

## 2021-01-20 ENCOUNTER — Encounter: Payer: Self-pay | Admitting: Orthopedic Surgery

## 2021-01-20 ENCOUNTER — Other Ambulatory Visit: Payer: Self-pay

## 2021-01-20 ENCOUNTER — Ambulatory Visit: Payer: No Typology Code available for payment source | Admitting: Orthopedic Surgery

## 2021-01-20 DIAGNOSIS — M25511 Pain in right shoulder: Secondary | ICD-10-CM | POA: Diagnosis not present

## 2021-01-20 DIAGNOSIS — M67911 Unspecified disorder of synovium and tendon, right shoulder: Secondary | ICD-10-CM

## 2021-01-20 NOTE — Progress Notes (Signed)
Office Visit Note   Patient: [name]           Date of Birth: [birth year]           MRN: [id number] Visit Date: 01/20/2021 Requested by: Celene Squibb, MD [place],  [place] [address] PCP: Celene Squibb, MD  Subjective: Chief Complaint  Patient presents with  . Other    Scan review    HPI: [name] is a 60 y.o. male who presents to the office complaining of right shoulder pain.  Patient returns to discuss MRI results of the right shoulder MRI.  MRI revealed interstitial tearing of the supraspinatus and infraspinatus, particularly the infraspinatus.  No full-thickness retracted tear.  Mild tendinopathy of the intra-articular portion of the long head of the bicep tendon.  Patient continues to complain of similar right shoulder pain to the last office visit.  Denies any weakness.  He does note clicking and mechanical symptoms with range of motion of the shoulder at times.  He lives in [place].  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                ROS: All systems reviewed are negative as they relate to the chief complaint within the history of present illness.  Patient denies fevers or chills.  Assessment & Plan: Visit Diagnoses:  1. Right shoulder pain, unspecified chronicity   2. Tendinopathy of right rotator cuff     Plan: Patient is a 60 year old male who presents complaining of right shoulder pain.  He is here to discuss MRI of the right shoulder.  Results are as described above in HPI.  Images were reviewed with the patient.  Discussed options available to patient.  With only interstitial tearing and no full-thickness rotator cuff tear, no urgent indication for operative management.  Discussed potential of healing with rotator cuff exercises and physical therapy.  Patient would like to avoid surgery at all cost.  Patient had right shoulder subacromial injection administered today and plan to refer patient to physical therapy at [facility] to go 1-2 times per week to focus  on right shoulder rotator cuff strengthening.  Follow-up in 6 weeks for clinical recheck.  If no improvement at that time, may reconsider operative management but there is good chance he can heal this with just therapy.  Patient agreed with this plan.  He understands that no surgery can be pursued for 2 months after injection.  Follow-Up Instructions: No follow-ups on file.   Orders:  Orders Placed This Encounter  Procedures  . Ambulatory referral to Occupational Therapy   No orders of the defined types were placed in this encounter.     Procedures: Large Joint Inj: R subacromial bursa on 01/20/2021 9:32 PM Indications: diagnostic evaluation and pain Details: 18 G 1.5 in needle, posterior approach  Arthrogram: No  Medications: 9 mL bupivacaine 0.5 %; 5 mL lidocaine 1 %; 6 mg betamethasone acetate-betamethasone sodium phosphate 6 (3-3) MG/ML Outcome: tolerated well, no immediate complications Procedure, treatment alternatives, risks and benefits explained, specific risks discussed. Consent was given by the patient. Immediately prior to procedure a time out was called to verify the correct patient, procedure, equipment, support staff and site/side marked as required. Patient was prepped and draped in the usual sterile fashion.       Clinical Data: No additional findings.  Objective: Vital Signs: There were no vitals taken for this visit.  Physical Exam:  Constitutional: Patient appears well-developed HEENT:  Head: Normocephalic Eyes:EOM are normal Neck: Normal range of motion Cardiovascular: Normal rate Pulmonary/chest: Effort normal Neurologic: Patient is alert Skin: Skin is warm Psychiatric: Patient has normal mood and affect  Ortho Exam: Ortho exam demonstrates right shoulder with preserved range of motion.  Excellent rotator cuff strength with 5/5 motor strength of subscap, infra, supra.  Mild crepitus noted intermittently with passive motion of the shoulder.  Negative  Hornblower sign.  Negative drop arm test.  Positive Neer and Hawkins impingement signs.  No tenderness over the [facility] joint.  No pain with crossarm adduction.  No tenderness throughout the axial cervical spine.  Negative Spurling sign.  Specialty Comments:  No specialty comments available.  Imaging: No results found.   PMFS History: Patient Active Problem List   Diagnosis Date Noted  . Encounter for screening colonoscopy 03/22/2018  . Fatty liver 03/22/2018  . GERD (gastroesophageal reflux disease) 03/22/2018  . CAD S/P percutaneous coronary angioplasty 09/18/2015  . Hyperlipidemia 09/18/2015  . Thrombocytopenia ([place]) 09/18/2015  . Diabetes mellitus type 2, diet-controlled ([place])   . Ischemic cardiomyopathy   . Ischemic chest pain ([place]) 09/17/2015  . Exertional angina ([place]) 09/17/2015  . Type 2 diabetes mellitus ([place]) 01/21/2014  . Preoperative cardiovascular examination 07/03/2013  . Essential hypertension 01/29/2010  . ANGINA, STABLE 09/01/2009  . CORONARY ATHEROSCLEROSIS NATIVE CORONARY ARTERY 09/01/2009  . DYSLIPIDEMIA 08/31/2009   Past Medical History:  Diagnosis Date  . Coronary atherosclerosis of native coronary artery    a. remote LAD stent. b. subsequent stents to RCA with restenosis. c. DES to RCA and Circ 06/2007 in setting of restenosis. d. DES to LAD & RCA in 01/2010. e. DES to Cx and LAD; total occlusion of RCA, LVEF 40-45%.  . Diabetes mellitus type 2, diet-controlled ([place])   . Essential hypertension   . Hepatic steatosis   . Hyperlipidemia   . Ischemic cardiomyopathy   . MI (myocardial infarction) ([place])   . Obstructive sleep apnea   . Thrombocytopenia ([place])     Family History  Problem Relation Age of Onset  . Coronary artery disease Other   . Hypertension Mother   . Diabetes Mother   . Heart attack Mother   . Diabetes Father   . CVA Father   . Diabetes Brother   . Hypertension Brother   . Colon cancer Neg Hx   . Colon polyps Neg Hx     Past Surgical  History:  Procedure Laterality Date  . BIOPSY  05/04/2018   Procedure: BIOPSY;  Surgeon: Danie Binder, MD;  [facility];  Service: Endoscopy;;  gastric  . CARDIAC CATHETERIZATION N/A 09/17/2015   Procedure: Left Heart Cath and Coronary Angiography;  Surgeon: [name], MD;  Location: [place] CV LAB;  Service: Cardiovascular;  Laterality: N/A;  . COLONOSCOPY N/A 05/04/2018   Procedure: COLONOSCOPY;  Surgeon: Danie Binder, MD;  [facility];  Service: Endoscopy;  Laterality: N/A;  11:00am  . CORONARY ANGIOPLASTY WITH STENT PLACEMENT    . left carpal tunnel    . POLYPECTOMY  05/04/2018   Procedure: POLYPECTOMY;  Surgeon: Danie Binder, MD;  [facility];  Service: Endoscopy;;  ascending,rectal,gastric, duodenal,  . ROTATOR CUFF REPAIR    . WISDOM TOOTH EXTRACTION     Social History   Occupational History  . 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 Smoking status: Former Smoker    Types: Cigarettes    Start date: 11/21/1977  Quit date: 08/04/2000    Years since quitting: 20.4  . Smokeless tobacco: Never Used  . Tobacco comment: Tobacco - no  Vaping Use  . Vaping Use: Never used  Substance and Sexual Activity  . Alcohol use: No    Alcohol/week: 0.0 standard drinks    Comment: rare, maybe 1 beer once a month   . Drug use: No  . Sexual activity: Not on file

## 2021-01-23 MED ORDER — LIDOCAINE HCL 1 % IJ SOLN
5.0000 mL | INTRAMUSCULAR | Status: AC | PRN
  Administered 2021-01-20: 5 mL

## 2021-01-23 MED ORDER — BUPIVACAINE HCL 0.5 % IJ SOLN
9.0000 mL | INTRAMUSCULAR | Status: AC | PRN
  Administered 2021-01-20: 9 mL via INTRA_ARTICULAR

## 2021-01-23 MED ORDER — BETAMETHASONE SOD PHOS & ACET 6 (3-3) MG/ML IJ SUSP
6.0000 mg | INTRAMUSCULAR | Status: AC | PRN
  Administered 2021-01-20: 6 mg via INTRA_ARTICULAR

## 2021-01-29 ENCOUNTER — Telehealth: Payer: Self-pay

## 2021-01-29 MED ORDER — VALSARTAN-HYDROCHLOROTHIAZIDE 320-25 MG PO TABS: 1.0000 | ORAL_TABLET | Freq: Every day | ORAL | 3 refills | Status: DC

## 2021-01-29 MED ORDER — METOPROLOL SUCCINATE ER 25 MG PO TB24: ORAL_TABLET | ORAL | 3 refills | Status: DC

## 2021-01-29 MED ORDER — FENOFIBRATE 160 MG PO TABS: 160.0000 mg | ORAL_TABLET | Freq: Every day | ORAL | 3 refills | Status: DC

## 2021-01-29 MED ORDER — ISOSORBIDE MONONITRATE ER 60 MG PO TB24: 60.0000 mg | ORAL_TABLET | Freq: Every day | ORAL | 3 refills | Status: DC

## 2021-01-29 MED ORDER — OMEPRAZOLE 20 MG PO CPDR: 20.0000 mg | DELAYED_RELEASE_CAPSULE | Freq: Every day | ORAL | 3 refills | Status: DC

## 2021-01-29 MED ORDER — ROSUVASTATIN CALCIUM 20 MG PO TABS: 20.0000 mg | ORAL_TABLET | Freq: Every day | ORAL | 3 refills | Status: DC

## 2021-01-29 NOTE — Telephone Encounter (Signed)
Fax received from [facility] for refills. Refill request approved and sent to pharmacy.

## 2021-02-04 ENCOUNTER — Telehealth: Payer: Self-pay | Admitting: Cardiology

## 2021-02-04 MED ORDER — ISOSORBIDE MONONITRATE ER 60 MG PO TB24: 60.0000 mg | ORAL_TABLET | Freq: Every day | ORAL | 3 refills | Status: DC

## 2021-02-04 NOTE — Telephone Encounter (Signed)
*  STAT* If patient is at the pharmacy, call can be transferred to refill team.   1. Which medications need to be refilled? (please list name of each medication and dose if known) isosorbide mononitrate (IMDUR) 60 MG 24 hr tablet  2. Which pharmacy/location (including street and city if local pharmacy) is medication to be sent to? [facility] ph#[phone]  3. Do they need a 30 day or 90 day supply? [place]

## 2021-02-04 NOTE — Telephone Encounter (Signed)
Patient called stating that [facility] never received refill approval for IMDUR 60 mg tablets that was sent on 01/29/2021. Resent prescription.

## 2021-03-01 ENCOUNTER — Ambulatory Visit: Payer: No Typology Code available for payment source | Admitting: Orthopedic Surgery

## 2021-03-15 ENCOUNTER — Other Ambulatory Visit: Payer: Self-pay | Admitting: *Deleted

## 2021-03-15 MED ORDER — PRASUGREL HCL 10 MG PO TABS: 10.0000 mg | ORAL_TABLET | Freq: Every day | ORAL | 1 refills | Status: DC

## 2021-03-16 ENCOUNTER — Telehealth: Payer: Self-pay | Admitting: Cardiology

## 2021-03-16 NOTE — Telephone Encounter (Signed)
*  STAT* If patient is at the pharmacy, call can be transferred to refill team.   1. Which medications need to be refilled? (please list name of each medication and dose if known) prasugrel (EFFIENT) 10 MG TABS tablet       2. Which pharmacy/location (including street and city if local pharmacy) is medication to be sent to? [facility]       3. Do they need a 30 day or 90 day supply? [place]

## 2021-03-16 NOTE — Telephone Encounter (Signed)
Requested medication was filled 03/15/2021 by Julian Hy, CMA and a 90 day supply was sent to [facility].

## 2021-04-22 ENCOUNTER — Ambulatory Visit: Payer: BC Managed Care – PPO | Admitting: Cardiology

## 2021-05-18 ENCOUNTER — Ambulatory Visit: Payer: No Typology Code available for payment source | Admitting: Student

## 2021-05-18 ENCOUNTER — Other Ambulatory Visit: Payer: Self-pay

## 2021-05-18 ENCOUNTER — Encounter: Payer: Self-pay | Admitting: Student

## 2021-05-18 VITALS — BP 118/80 | HR 94 | Ht 72.0 in | Wt 226.0 lb

## 2021-05-18 DIAGNOSIS — I10 Essential (primary) hypertension: Secondary | ICD-10-CM | POA: Diagnosis not present

## 2021-05-18 DIAGNOSIS — I25118 Atherosclerotic heart disease of native coronary artery with other forms of angina pectoris: Secondary | ICD-10-CM | POA: Diagnosis not present

## 2021-05-18 DIAGNOSIS — E785 Hyperlipidemia, unspecified: Secondary | ICD-10-CM

## 2021-05-18 DIAGNOSIS — Z79899 Other long term (current) drug therapy: Secondary | ICD-10-CM

## 2021-05-18 DIAGNOSIS — Z794 Long term (current) use of insulin: Secondary | ICD-10-CM

## 2021-05-18 DIAGNOSIS — I255 Ischemic cardiomyopathy: Secondary | ICD-10-CM

## 2021-05-18 DIAGNOSIS — E1159 Type 2 diabetes mellitus with other circulatory complications: Secondary | ICD-10-CM

## 2021-05-18 NOTE — Patient Instructions (Signed)
Medication Instructions:  Your physician recommends that you continue on your current medications as directed. Please refer to the Current Medication list given to you today.  *If you need a refill on your cardiac medications before your next appointment, please call your pharmacy*   Lab Work: Your physician recommends that you return for lab work in: CBC, CMET, Lipid, A1C   If you have labs (blood work) drawn today and your tests are completely normal, you will receive your results only by: MyChart Message (if you have [place]) OR A paper copy in the mail If you have any lab test that is abnormal or we need to change your treatment, we will call you to review the results.   Testing/Procedures: NONE    Follow-Up: At [facility], you and your health needs are our priority.  As part of our continuing mission to provide you with exceptional heart care, we have created designated Provider Care Teams.  These Care Teams include your primary Cardiologist (physician) and Advanced Practice Providers (APPs -  Physician Assistants and Nurse Practitioners) who all work together to provide you with the care you need, when you need it.  We recommend signing up for the patient portal called "MyChart".  Sign up information is provided on this After Visit Summary.  MyChart is used to connect with patients for Virtual Visits (Telemedicine).  Patients are able to view lab/test results, encounter notes, upcoming appointments, etc.  Non-urgent messages can be sent to your provider as well.   To learn more about what you can do with MyChart, go to [email or web address].    Your next appointment:   6 month(s)  The format for your next appointment:   In Person  Provider:   Rozann Lesches, MD   Other Instructions Thank you for choosing [facility]!

## 2021-05-18 NOTE — Progress Notes (Signed)
Cardiology Office Note    Date:  05/18/2021   ID:  [name], DOB [birth year], MRN [id number]  PCP:  Celene Squibb, MD  Cardiologist: Rozann Lesches, MD    Chief Complaint  Patient presents with   Follow-up    6 month visit    History of Present Illness:    [name] is a 60 y.o. male with past medical history of CAD (s/p prior stents to LAD and RCA, DES to RCA and LCx in 2008, DES to LAD and RCA in 2011 and DES to LCx and LAD in 2016 with CTO of RCA noted), ischemic cardiomyopathy (EF 45% in 2016, at 45-50% by repeat echo in 12/2018), HTN, HLD and Type 2 DM who presents to the office today for 26-month follow-up.   He was last examined by Dr. Domenic Polite in 10/2020 and denied any recent chest pain or dyspnea on exertion. He was continued on his current cardiac medications including ASA, Effient, Imdur, Fenofibrate, Valsartan-HCTZ, Crestor, Praluent and Toprol-XL.   In talking with the patient today, he reports his symptoms have overall been stable since his last visit. He does experience angina if he forgets to take his morning Imdur but reports symptoms are typically well controlled on his current medications. He has not had to utilize SL NTG. Denies any recent dyspnea on exertion, orthopnea, PND or lower extremity edema.  He does report intermittent issues with ED and questions if one of his medications could be contributing to this.   Past Medical History:  Diagnosis Date   Coronary atherosclerosis of native coronary artery    a. remote LAD stent. b. subsequent stents to RCA with restenosis. c. DES to RCA and Circ 06/2007 in setting of restenosis. d. DES to LAD & RCA in 01/2010. e. DES to Cx and LAD; total occlusion of RCA, LVEF 40-45%.   Diabetes mellitus type 2, diet-controlled ([place])    Essential hypertension    Hepatic steatosis    Hyperlipidemia    Ischemic cardiomyopathy    MI (myocardial infarction) ([place])    Obstructive sleep apnea    Thrombocytopenia ([place])      Past Surgical History:  Procedure Laterality Date   BIOPSY  05/04/2018   Procedure: BIOPSY;  Surgeon: Danie Binder, MD;  Location: [facility];  Service: Endoscopy;;  gastric   CARDIAC CATHETERIZATION N/A 09/17/2015   Procedure: Left Heart Cath and Coronary Angiography;  Surgeon: [name], MD;  Location: [facility];  Service: Cardiovascular;  Laterality: N/A;   COLONOSCOPY N/A 05/04/2018   Procedure: COLONOSCOPY;  Surgeon: Danie Binder, MD;  Location: [facility];  Service: Endoscopy;  Laterality: N/A;  11:00am   CORONARY ANGIOPLASTY WITH STENT PLACEMENT     left carpal tunnel     POLYPECTOMY  05/04/2018   Procedure: POLYPECTOMY;  Surgeon: Danie Binder, MD;  Location: [facility];  Service: Endoscopy;;  ascending,rectal,gastric, duodenal,   ROTATOR CUFF REPAIR     WISDOM TOOTH EXTRACTION      Current Medications: Outpatient Medications Prior to Visit  Medication Sig Dispense Refill   Alirocumab (PRALUENT) 150 MG/ML SOAJ Inject 150 mg into the skin every 14 (fourteen) days. 2 mL 11   aspirin 81 MG tablet Take 1 tablet (81 mg total) by mouth daily. 90 tablet 3   Coenzyme Q10 (COQ-10) 200 MG CAPS Take 200 mg by mouth daily.      fenofibrate 160 MG tablet Take 1 tablet (160 mg total)  by mouth daily. 90 tablet 3   Insulin Pen Needle 32G X 4 MM MISC 1 each by Does not apply route as directed.     isosorbide mononitrate (IMDUR) 60 MG 24 hr tablet Take 1 tablet (60 mg total) by mouth daily. 90 tablet 3   MAGNESIUM GLUCONATE PO Take 100 mg by mouth 2 (two) times daily.      metoprolol succinate (TOPROL-XL) 25 MG 24 hr tablet TAKE 3 TABLETS DAILY 270 tablet 3   Multiple Vitamin (MULTIVITAMIN) tablet Take 1 tablet by mouth daily.     nitroGLYCERIN (NITROSTAT) 0.4 MG SL tablet Place 1 tablet (0.4 mg total) under the tongue every 5 (five) minutes x 3 doses as needed for chest pain (if no relief afte 2nd dose, proceed to the ED for an evaluation or call 911). For chest  pain 100 tablet 1   omega-3 acid ethyl esters (LOVAZA) 1 g capsule TAKE TWO CAPSULES BY MOUTH TWICE A DAY 360 capsule 2   omeprazole (PRILOSEC) 20 MG capsule Take 1 capsule (20 mg total) by mouth daily. 90 capsule 3   potassium chloride (KLOR-CON) 10 MEQ tablet Take 10 mEq by mouth daily.     prasugrel (EFFIENT) 10 MG TABS tablet Take 1 tablet (10 mg total) by mouth daily. 90 tablet 1   rosuvastatin (CRESTOR) 20 MG tablet Take 1 tablet (20 mg total) by mouth daily. 90 tablet 3   RYBELSUS 14 MG TABS Take 1 tablet by mouth every morning.     SYNJARDY XR 25-1000 MG TB24 Take 1 tablet by mouth daily.     traMADol (ULTRAM) 50 MG tablet Take 50 mg by mouth 3 (three) times daily as needed.     TRESIBA FLEXTOUCH 100 UNIT/ML SOPN FlexTouch Pen INJECT 10 20 UNITS SUBCUTANEOUSLY ONCE DAILY     valsartan-hydrochlorothiazide (DIOVAN-HCT) 320-25 MG tablet Take 1 tablet by mouth daily. 90 tablet 3   No facility-administered medications prior to visit.     Allergies:   Reopro [abciximab]   Social History   Socioeconomic History   Marital status: Married    Spouse name: Not on file   Number of children: Not on file   Years of education: Not on file   Highest education level: Not on file  Occupational History   Occupation: Full-time    Employer: [employer]  Tobacco Use   Smoking status: Former    Pack years: 0.00    Types: Cigarettes    Start date: 11/21/1977    Quit date: 08/04/2000    Years since quitting: 20.8   Smokeless tobacco: Never   Tobacco comments:    Tobacco - no  Vaping Use   Vaping Use: Never used  Substance and Sexual Activity   Alcohol use: No    Alcohol/week: 0.0 standard drinks    Comment: rare, maybe 1 beer once a month    Drug use: No   Sexual activity: Not on file  Other Topics Concern   Not on file  Social History Narrative   Not on file   Social Determinants of Health   Financial Resource Strain: Not on file  Food Insecurity: Not on file   Transportation Needs: Not on file  Physical Activity: Not on file  Stress: Not on file  Social Connections: Not on file     Family History:  The patient's family history includes CVA in his father; Coronary artery disease in an other family member; Diabetes in his brother, father, and mother; Heart  attack in his mother; Hypertension in his brother and mother.   Review of Systems:    Please see the history of present illness.     All other systems reviewed and are otherwise negative except as noted above.   Physical Exam:    VS:  BP 118/80   Pulse 94   Ht 6' (1.829 m)   Wt 226 lb (102.5 kg)   SpO2 94%   BMI 30.65 kg/m    General: Well developed, well nourished,male appearing in no acute distress. Head: Normocephalic, atraumatic. Neck: No carotid bruits. JVD not elevated.  Lungs: Respirations regular and unlabored, without wheezes or rales.  Heart: Regular rate and rhythm. No S3 or S4.  No murmur, no rubs, or gallops appreciated. Abdomen: Appears non-distended. No obvious abdominal masses. Msk:  Strength and tone appear normal for age. No obvious joint deformities or effusions. Extremities: No clubbing or cyanosis. No pitting edema.  Distal pedal pulses are 2+ bilaterally. Neuro: Alert and oriented X 3. Moves all extremities spontaneously. No focal deficits noted. Psych:  Responds to questions appropriately with a normal affect. Skin: No rashes or lesions noted  Wt Readings from Last 3 Encounters:  05/18/21 226 lb (102.5 kg)  11/02/20 225 lb 9.6 oz (102.3 kg)  04/30/20 215 lb (97.5 kg)     Studies/Labs Reviewed:   EKG:  EKG is not ordered today.   Recent Labs: No results found for requested labs within last 8760 hours.   Lipid Panel    Component Value Date/Time   CHOL 92 05/14/2020 0947   CHOL 103 08/26/2019 0838   TRIG 527 (H) 05/14/2020 0947   HDL 29 (L) 05/14/2020 0947   HDL 29 (L) 08/26/2019 0838   CHOLHDL 3.2 05/14/2020 0947   VLDL UNABLE TO CALCULATE IF  TRIGLYCERIDE OVER 400 mg/dL 05/14/2020 0947   LDLCALC UNABLE TO CALCULATE IF TRIGLYCERIDE OVER 400 mg/dL 05/14/2020 0947   LDLCALC Comment (A) 08/26/2019 0838   LDLCALC  04/12/2019 1047     Comment:     . LDL cholesterol not calculated. Triglyceride levels greater than 400 mg/dL invalidate calculated LDL results. . Reference range: <100 . Desirable range <100 mg/dL for primary prevention;   <70 mg/dL for patients with CHD or diabetic patients  with > or = 2 CHD risk factors. [name] LDL-C is now calculated using the Martin-Hopkins  calculation, which is a validated novel method providing  better accuracy than the Friedewald equation in the  estimation of LDL-C.  Cresenciano Genre et al. Annamaria Helling. 8416;606(30): 2061-2068  (http://education.Quest[employer]ostics.com/faq/FAQ164)    LDLDIRECT 10.8 05/14/2020 0947    Additional studies/ records that were reviewed today include:   Cardiac Catheterization: 08/2015 1. Severe multivessel coronary artery disease as detailed with total occlusion of the RCA, severe de novo stenosis of the left circumflex, and severe distal edge stent restenosis in the LAD 2. Moderate segmental LV systolic dysfunction with LVEF estimated at 40-45%, wall motion pattern consistent with this patient's known history of anterior infarction 3. Successful 2 vessel PCI with stenting of the circumflex and LAD using a drug-eluting stent platforms   The patient should be maintained on long-term dual antiplatelet therapy. He is currently tolerating aspirin and Effient.  Echocardiogram: 12/2018 IMPRESSIONS     1. The left ventricle has mildly reduced systolic function of 16-01%. The  cavity size was normal. There is mildly increased left ventricular wall  thickness. Echo evidence of impaired diastolic relaxation.   2. The right ventricle has normal systolic function.  The cavity was  normal. There is no increase in right ventricular wall thickness. Right  ventricular systolic pressure  could not be assessed.   3. The aortic valve is tricuspid There is mild aortic annular  calcification noted.   4. The mitral valve is normal in structure.   5. The tricuspid valve is normal in structure.   6. The aortic root is normal in size and structure.   7. There is akinesis of the apical inferior left ventricular segment.   8. There is akinesis of the mid-apical anteroseptal left ventricular  segment.   Assessment:    1. Coronary artery disease of native artery of native heart with stable angina pectoris ([place])   2. Ischemic cardiomyopathy   3. Medication management   4. Essential hypertension   5. Hyperlipidemia LDL goal <70   6. Type 2 diabetes mellitus with other circulatory complication, with long-term current use of insulin ([place])      Plan:   In order of problems listed above:  1. CAD - He is s/p prior stents to LAD and RCA, DES to RCA and LCx in 2008, DES to LAD and RCA in 2011 and DES to LCx and LAD in 2016 with CTO of RCA noted. - He has stable angina and denies any increase in frequency or severity of his symptoms. - Will continue current medical therapy with ASA 81 mg daily, Imdur 60 mg daily, Toprol-XL 75 mg daily, Effient 10 mg daily, Crestor 20 mg daily and Praluent. We reviewed that if his ED issues persist, could try reducing Toprol-XL to see if this helps with symptoms or consider a referral to Urology. Medical therapy for ED is limited given the use of long-acting nitrates.   2. Ischemic cardiomyopathy - His EF was at 45-50% by repeat echo in 12/2018. He denies any recent orthopnea, PND or lower extremity edema. Appears euvolemic by examination today. - We discussed possibly obtaining a repeat echocardiogram in the future but will hold off for now given no recent symptoms. Continue Toprol-XL 75 mg daily and Valsartan-HCTZ 320-25 mg daily. Will recheck CBC and BMET with upcoming labs.   3. HTN - His blood pressure is well controlled at 118/80 during today's  visit and he reports similar readings when checked at home. Continue current medication regimen.  4. HLD - His LDL was down to 10 in 04/2020 but triglycerides remained elevated at 527 but significantly improved from 2529 in 03/2019. - He remains on Fenofibrate 160 mg daily, Lovaza 2 g twice daily, Praluent and Crestor 20 mg daily.  5. Type 2 DM - His Hgb A1c was at 6.8 in 08/2020. Will recheck Hgb A1c with repeat labs as he has not had this rechecked by his PCP in 8+ months. He remains on Synjardy and long-acting insulin 15 units daily.   Medication Adjustments/Labs and Tests Ordered: Current medicines are reviewed at length with the patient today.  Concerns regarding medicines are outlined above.  Medication changes, Labs and Tests ordered today are listed in the Patient Instructions below. Patient Instructions  Medication Instructions:  Your physician recommends that you continue on your current medications as directed. Please refer to the Current Medication list given to you today.  *If you need a refill on your cardiac medications before your next appointment, please call your pharmacy*   Lab Work: Your physician recommends that you return for lab work in: CBC, CMET, Lipid, A1C   If you have labs (blood work) drawn today and your  tests are completely normal, you will receive your results only by: MyChart Message (if you have MyChart) OR A paper copy in the mail If you have any lab test that is abnormal or we need to change your treatment, we will call you to review the results.   Testing/Procedures: NONE    Follow-Up: At [facility], you and your health needs are our priority.  As part of our continuing mission to provide you with exceptional heart care, we have created designated Provider Care Teams.  These Care Teams include your primary Cardiologist (physician) and Advanced Practice Providers (APPs -  Physician Assistants and Nurse Practitioners) who all work together to  provide you with the care you need, when you need it.  We recommend signing up for the patient portal called "MyChart".  Sign up information is provided on this After Visit Summary.  MyChart is used to connect with patients for Virtual Visits (Telemedicine).  Patients are able to view lab/test results, encounter notes, upcoming appointments, etc.  Non-urgent messages can be sent to your provider as well.   To learn more about what you can do with MyChart, go to [email or web address].    Your next appointment:   6 month(s)  The format for your next appointment:   In Person  Provider:   Rozann Lesches, MD   Other Instructions Thank you for choosing [facility]!     Signed, Erma Heritage, PA-C  05/18/2021 4:47 PM    [facility] [address] [place], [place] [address] Phone: [phone] Fax: [phone]

## 2021-05-27 ENCOUNTER — Other Ambulatory Visit: Payer: Self-pay

## 2021-05-27 ENCOUNTER — Other Ambulatory Visit (HOSPITAL_COMMUNITY)
Admission: RE | Admit: 2021-05-27 | Discharge: 2021-05-27 | Disposition: A | Discharge status: Home / Self Care | Payer: No Typology Code available for payment source | Source: Ambulatory Visit | Attending: Student | Admitting: Student

## 2021-05-27 DIAGNOSIS — E1159 Type 2 diabetes mellitus with other circulatory complications: Secondary | ICD-10-CM | POA: Insufficient documentation

## 2021-05-27 DIAGNOSIS — Z79899 Other long term (current) drug therapy: Secondary | ICD-10-CM | POA: Diagnosis present

## 2021-05-27 DIAGNOSIS — E785 Hyperlipidemia, unspecified: Secondary | ICD-10-CM | POA: Insufficient documentation

## 2021-05-27 DIAGNOSIS — Z794 Long term (current) use of insulin: Secondary | ICD-10-CM | POA: Diagnosis present

## 2021-05-27 LAB — CBC
HCT: 48.1 % (ref 39.0–52.0)
Hemoglobin: 16.4 g/dL (ref 13.0–17.0)
MCH: 31.1 pg (ref 26.0–34.0)
MCHC: 34.1 g/dL (ref 30.0–36.0)
MCV: 91.3 fL (ref 80.0–100.0)
Platelets: 169 10*3/uL (ref 150–400)
RBC: 5.27 MIL/uL (ref 4.22–5.81)
RDW: 13.3 % (ref 11.5–15.5)
WBC: 6.5 10*3/uL (ref 4.0–10.5)
nRBC: 0 % (ref 0.0–0.2)

## 2021-05-27 LAB — LIPID PANEL
Cholesterol: 80 mg/dL (ref 0–200)
HDL: 32 mg/dL — ABNORMAL LOW (ref >40)
LDL Cholesterol: UNDETERMINED mg/dL (ref 0–99)
Total CHOL/HDL Ratio: 2.5 RATIO
Triglycerides: 420 mg/dL — ABNORMAL HIGH (ref <150)
VLDL: UNDETERMINED mg/dL (ref 0–40)

## 2021-05-27 LAB — COMPREHENSIVE METABOLIC PANEL
ALT: 50 U/L — ABNORMAL HIGH (ref 0–44)
AST: 32 U/L (ref 15–41)
Albumin: 4.6 g/dL (ref 3.5–5.0)
Alkaline Phosphatase: 42 U/L (ref 38–126)
Anion gap: 9 (ref 5–15)
BUN: 21 mg/dL — ABNORMAL HIGH (ref 6–20)
CO2: 25 mmol/L (ref 22–32)
Calcium: 9.3 mg/dL (ref 8.9–10.3)
Chloride: 101 mmol/L (ref 98–111)
Creatinine, Ser: 1 mg/dL (ref 0.61–1.24)
GFR, Estimated: >60 mL/min (ref >60)
Glucose, Bld: 176 mg/dL — ABNORMAL HIGH (ref 70–99)
Potassium: 4.2 mmol/L (ref 3.5–5.1)
Sodium: 135 mmol/L (ref 135–145)
Total Bilirubin: 0.9 mg/dL (ref 0.3–1.2)
Total Protein: 7.6 g/dL (ref 6.5–8.1)

## 2021-05-27 LAB — LDL CHOLESTEROL, DIRECT: Direct LDL: <10 mg/dL (ref 0–99)

## 2021-05-27 LAB — HEMOGLOBIN A1C
Hgb A1c MFr Bld: 6.5 % — ABNORMAL HIGH (ref 4.8–5.6)
Mean Plasma Glucose: 139.85 mg/dL

## 2021-06-01 ENCOUNTER — Telehealth: Payer: Self-pay

## 2021-06-01 NOTE — Telephone Encounter (Signed)
-----   Message from Erma Heritage, [place] sent at 05/27/2021  4:30 PM EDT ----- Please let the patient know his hemoglobin and platelets remain within a normal range.  Electrolytes and kidney function also stable. AST (liver enzyme) is slightly elevated but similar to prior values. His Hgb A1c was previously at 6.8 in 2021 and has improved to 6.5. Triglycerides continue to improve as well and are now down to 420 with LDL less than 10.  Continue current medical therapy. Please forward a copy of results to Celene Squibb, MD.

## 2021-06-01 NOTE — Telephone Encounter (Signed)
Called pt x3 and left messages for pt to call our office back for results. Will mail pt letter.

## 2021-06-16 ENCOUNTER — Ambulatory Visit: Payer: No Typology Code available for payment source | Admitting: Cardiology

## 2021-08-02 ENCOUNTER — Telehealth: Payer: Self-pay | Admitting: Cardiology

## 2021-08-02 MED ORDER — PRALUENT 150 MG/ML ~~LOC~~ SOAJ: 150.0000 mg | SUBCUTANEOUS | 6 refills | Status: DC

## 2021-08-02 NOTE — Telephone Encounter (Signed)
Needing refill for Alirocumab ([place]) 150 MG/ML Darden Palmer [id number] sent to [facility]   Pt is leaving to go out of town on Friday for 2 weeks and he's completely out

## 2021-08-05 ENCOUNTER — Telehealth: Payer: Self-pay

## 2021-08-05 NOTE — Telephone Encounter (Signed)
Prior Authorization for Alirocumab (Praluent) 150 mg/mL inj. Approved from 08/03/2021-08/03/2022

## 2021-08-16 ENCOUNTER — Telehealth: Payer: Self-pay | Admitting: Cardiology

## 2021-08-16 NOTE — Telephone Encounter (Signed)
Pt called stating that he has tested positive for Covid and he is worried because he can not get in touch with his PCP  since he has pre existing conditions - diabetes, heart problems, etc   Please call [phone]

## 2021-08-16 NOTE — Telephone Encounter (Signed)
Spoke with patient - stated he has changed pcp's.  Will be seeing a new provider tomorrow.  Sounds like he is having mild symptoms but will be evaluated tomorrow.  Due for f/u in December.  No need for sooner f/u due to having covid unless he has ongoing chest pain or sob.  Patient verbalized understanding.

## 2021-08-27 ENCOUNTER — Telehealth: Payer: Self-pay | Admitting: Cardiology

## 2021-08-27 ENCOUNTER — Ambulatory Visit (HOSPITAL_COMMUNITY)
Admission: RE | Admit: 2021-08-27 | Discharge: 2021-08-27 | Disposition: A | Discharge status: Home / Self Care | Payer: No Typology Code available for payment source | Source: Ambulatory Visit | Attending: Adult Health Nurse Practitioner | Admitting: Adult Health Nurse Practitioner

## 2021-08-27 ENCOUNTER — Other Ambulatory Visit (HOSPITAL_COMMUNITY): Payer: Self-pay | Admitting: Adult Health Nurse Practitioner

## 2021-08-27 ENCOUNTER — Other Ambulatory Visit: Payer: Self-pay

## 2021-08-27 DIAGNOSIS — R0602 Shortness of breath: Secondary | ICD-10-CM | POA: Diagnosis present

## 2021-08-27 NOTE — Telephone Encounter (Signed)
Pt's wife, Rip Harbour called and wanted Dr. Domenic Polite to be made aware that Mr. Bond has Covid Pneumonia.

## 2021-08-30 ENCOUNTER — Other Ambulatory Visit (HOSPITAL_COMMUNITY): Payer: Self-pay | Admitting: Adult Health Nurse Practitioner

## 2021-08-30 DIAGNOSIS — U071 COVID-19: Secondary | ICD-10-CM

## 2021-09-05 ENCOUNTER — Other Ambulatory Visit: Payer: Self-pay | Admitting: Cardiology

## 2021-10-10 ENCOUNTER — Other Ambulatory Visit: Payer: Self-pay | Admitting: Cardiology

## 2021-11-16 ENCOUNTER — Other Ambulatory Visit: Payer: Self-pay | Admitting: Cardiology

## 2022-02-13 ENCOUNTER — Other Ambulatory Visit: Payer: Self-pay | Admitting: Cardiology

## 2022-02-21 ENCOUNTER — Telehealth: Payer: Self-pay | Admitting: Cardiology

## 2022-02-21 MED ORDER — VALSARTAN-HYDROCHLOROTHIAZIDE 320-25 MG PO TABS: 1.0000 | ORAL_TABLET | Freq: Every day | ORAL | 1 refills | Status: DC

## 2022-02-21 MED ORDER — ISOSORBIDE MONONITRATE ER 60 MG PO TB24: 60.0000 mg | ORAL_TABLET | Freq: Every day | ORAL | 1 refills | Status: DC

## 2022-02-21 NOTE — Telephone Encounter (Signed)
?*  STAT* If patient is at the pharmacy, call can be transferred to refill team. ? ? ?1. Which medications need to be refilled? (please list name of each medication and dose if known)  ?valsartan-hydrochlorothiazide (DIOVAN-HCT) 320-25 MG tablet ?isosorbide mononitrate (IMDUR) 60 MG 24 hr tablet ? ?2. Which pharmacy/location (including street and city if local pharmacy) is medication to be sent to? ?[facility] ([place], [place] ? ?3. Do they need a 30 day or 90 day supply? 90 with refills ? ?Patient is scheduled to see Melina Copa 05/26/22 ?

## 2022-02-21 NOTE — Telephone Encounter (Signed)
Medication refill request for Imdur 60 mg tablets and Diovan HCT 320-25 mg tablets approved and sent to [facility] per pt's request.  ?

## 2022-02-22 MED ORDER — ISOSORBIDE MONONITRATE ER 60 MG PO TB24: 60.0000 mg | ORAL_TABLET | Freq: Every day | ORAL | 1 refills | Status: DC

## 2022-02-22 MED ORDER — VALSARTAN-HYDROCHLOROTHIAZIDE 320-25 MG PO TABS: 1.0000 | ORAL_TABLET | Freq: Every day | ORAL | 1 refills | Status: DC

## 2022-02-22 NOTE — Addendum Note (Signed)
Addended by: Christella Scheuermann C on: 02/22/2022 09:19 AM ? ? Modules accepted: Orders ? ?

## 2022-02-22 NOTE — Telephone Encounter (Signed)
Patient states his refill needs to go to [facility] not [facility]. He would like [facility] removed from his chart. ?

## 2022-02-27 ENCOUNTER — Other Ambulatory Visit: Payer: Self-pay | Admitting: Cardiology

## 2022-04-20 ENCOUNTER — Other Ambulatory Visit: Payer: Self-pay | Admitting: Cardiology

## 2022-05-25 NOTE — Progress Notes (Unsigned)
Cardiology Office Note    Date:  05/26/2022   ID:  [name], DOB 1961/10/07, MRN [id number]  PCP:  No primary care provider on file.  Cardiologist:  Rozann Lesches, MD  Electrophysiologist:  None   Chief Complaint: f/u CAD  History of Present Illness:   [name] is a 61 y.o. male with history of CAD (s/p prior stents to LAD and RCA, DES to RCA and LCx in 2008, DES to LAD and RCA in 2011 and DES to LCx and LAD in 2016 with CTO of RCA noted), ischemic cardiomyopathy (EF 45% in 2016, at 45-50% by repeat echo in 12/2018), HTN, HLD/hypertriglyceridemia, hepatic steatosis and diabetes mellitus type 2 who is seen for follow-up. Last ischemic assessment was by cath in 2016 as below. Last echo 12/2018 showed EF 45-50%, mildly increased LV wall thickness, normal RV, mild aortic calcification, akinesis of the apical inferior left ventricular segment, akinesis of the mid-apical anteroseptal left ventricular segment.   He is seen for follow-up today overall feeling stable from prior. He has chronic stable angina with higher levels of activity (walking very briskly at work or emotional upset). He denies any significant change from prior. Denies any new shortness of breath. No edema. He reports his blood pressure is not usually as elevated as it is today. Recheck by me 152/92. He continues to report symptoms of ED.   Labwork independently reviewed: [facility] 01/2022 A1C 7.4, TSH wnl, Cr 1.10, K not reported, AST/ALT 46/56, Hgb 17.4, Hct 51.3, Plt 142 (intermittently low in the past as well), lipid panel with Trig 633, HDL 30, LDL 30 NOVANT 08/2021 K 4.3 05/2021 Direct LDL <10, Trig 420, HDL 32, CBC wnl, A1C 6.5, K 4.2, Cr 1.00, AST wnl, ALT 50   Cardiology Studies:   Studies reviewed are outlined and summarized above. Reports included below if pertinent.   Art Duplex 2021   Summary:  Right: Resting right ankle-brachial index is within normal range.  No evidence of significant right lower  extremity arterial disease. The  right toe-brachial index is normal.   Left: Resting left ankle-brachial index is within normal range.  No evidence of significant left lower extremity arterial disease. The left  toe-brachial index is normal.       *See table(s) above for measurements and observations.     Echo 12/2018   1. The left ventricle has mildly reduced systolic function of 40-98%. The  cavity size was normal. There is mildly increased left ventricular wall  thickness. Echo evidence of impaired diastolic relaxation.   2. The right ventricle has normal systolic function. The cavity was  normal. There is no increase in right ventricular wall thickness. Right  ventricular systolic pressure could not be assessed.   3. The aortic valve is tricuspid There is mild aortic annular  calcification noted.   4. The mitral valve is normal in structure.   5. The tricuspid valve is normal in structure.   6. The aortic root is normal in size and structure.   7. There is akinesis of the apical inferior left ventricular segment.   8. There is akinesis of the mid-apical anteroseptal left ventricular  segment.   LHC 2016 1. Severe multivessel coronary artery disease as detailed with total occlusion of the RCA, severe de novo stenosis of the left circumflex, and severe distal edge stent restenosis in the LAD 2. Moderate segmental LV systolic dysfunction with LVEF estimated at 40-45%, wall motion pattern consistent with this patient's known  history of anterior infarction 3. Successful 2 vessel PCI with stenting of the circumflex and LAD using a drug-eluting stent platforms   The patient should be maintained on long-term dual antiplatelet therapy. He is currently tolerating aspirin and Effient.    Past Medical History:  Diagnosis Date   Coronary atherosclerosis of native coronary artery    a. remote LAD stent. b. subsequent stents to RCA with restenosis. c. DES to RCA and Circ 06/2007 in setting  of restenosis. d. DES to LAD & RCA in 01/2010. e. DES to Cx and LAD; total occlusion of RCA, LVEF 40-45%.   Diabetes mellitus type 2, diet-controlled ([place])    Essential hypertension    Hepatic steatosis    Hyperlipidemia    Ischemic cardiomyopathy    MI (myocardial infarction) ([place])    Obstructive sleep apnea    Thrombocytopenia ([place])     Past Surgical History:  Procedure Laterality Date   BIOPSY  05/04/2018   Procedure: BIOPSY;  Surgeon: Danie Binder, MD;  [facility];  Service: Endoscopy;;  gastric   CARDIAC CATHETERIZATION N/A 09/17/2015   Procedure: Left Heart Cath and Coronary Angiography;  Surgeon: [name], MD;  Location: [facility];  Service: Cardiovascular;  Laterality: N/A;   COLONOSCOPY N/A 05/04/2018   Procedure: COLONOSCOPY;  Surgeon: Danie Binder, MD;  [facility];  Service: Endoscopy;  Laterality: N/A;  11:00am   CORONARY ANGIOPLASTY WITH STENT PLACEMENT     left carpal tunnel     POLYPECTOMY  05/04/2018   Procedure: POLYPECTOMY;  Surgeon: Danie Binder, MD;  [facility];  Service: Endoscopy;;  ascending,rectal,gastric, duodenal,   ROTATOR CUFF REPAIR     WISDOM TOOTH EXTRACTION      Current Medications: Current Meds  Medication Sig   Alirocumab (PRALUENT) 150 MG/ML SOAJ Inject 150 mg into the skin every 14 (fourteen) days.   aspirin 81 MG tablet Take 1 tablet (81 mg total) by mouth daily.   Coenzyme Q10 (COQ-10) 200 MG CAPS Take 200 mg by mouth daily.    fenofibrate 160 MG tablet TAKE 1 TABLET DAILY   Insulin Pen Needle 32G X 4 MM MISC 1 each by Does not apply route as directed.   isosorbide mononitrate (IMDUR) 60 MG 24 hr tablet Take 1 tablet (60 mg total) by mouth daily.   MAGNESIUM GLUCONATE PO Take 100 mg by mouth 2 (two) times daily.    metoprolol succinate (TOPROL-XL) 25 MG 24 hr tablet TAKE 3 TABLETS DAILY   Multiple Vitamin (MULTIVITAMIN) tablet Take 1 tablet by mouth daily.   nitroGLYCERIN  (NITROSTAT) 0.4 MG SL tablet Place 1 tablet (0.4 mg total) under the tongue every 5 (five) minutes x 3 doses as needed for chest pain (if no relief afte 2nd dose, proceed to the ED for an evaluation or call 911). For chest pain   omega-3 acid ethyl esters (LOVAZA) 1 g capsule TAKE TWO CAPSULES BY MOUTH TWICE A DAY   omeprazole (PRILOSEC) 20 MG capsule TAKE 1 CAPSULE DAILY   prasugrel (EFFIENT) 10 MG TABS tablet TAKE 1 TABLET DAILY   rosuvastatin (CRESTOR) 20 MG tablet TAKE 1 TABLET DAILY   RYBELSUS 14 MG TABS Take 1 tablet by mouth every morning.   SYNJARDY XR 25-1000 MG TB24 Take 2 tablets by mouth daily.   TRESIBA FLEXTOUCH 100 UNIT/ML SOPN FlexTouch Pen INJECT 10 20 UNITS SUBCUTANEOUSLY ONCE DAILY   valsartan-hydrochlorothiazide (DIOVAN-HCT) 320-25 MG tablet Take 1 tablet by  mouth daily.    Allergies:   Reopro [abciximab]   Social History   Socioeconomic History   Marital status: Married    Spouse name: Not on file   Number of children: Not on file   Years of education: Not on file   Highest education level: Not on file  Occupational History   Occupation: Full-time    Employer: [employer]  Tobacco Use   Smoking status: Former    Types: Cigarettes    Start date: 11/21/1977    Quit date: 08/04/2000    Years since quitting: 21.8   Smokeless tobacco: Never   Tobacco comments:    Tobacco - no  Vaping Use   Vaping Use: Never used  Substance and Sexual Activity   Alcohol use: No    Alcohol/week: 0.0 standard drinks of alcohol    Comment: rare, maybe 1 beer once a month    Drug use: No   Sexual activity: Not on file  Other Topics Concern   Not on file  Social History Narrative   Not on file   Social Determinants of Health   Financial Resource Strain: Not on file  Food Insecurity: Not on file  Transportation Needs: Not on file  Physical Activity: Not on file  Stress: Not on file  Social Connections: Not on file     Family History:  The patient's family history  includes CVA in his father; Coronary artery disease in an other family member; Diabetes in his brother, father, and mother; Heart attack in his mother; Hypertension in his brother and mother. There is no history of Colon cancer or Colon polyps.  ROS:   Please see the history of present illness.  All other systems are reviewed and otherwise negative.    EKG(s)/Additional Labs   EKG:  EKG is ordered today, personally reviewed, demonstrating NSR 88bpm, baseline wander, LAD, poor R wave progression, no acute change from prior   Recent Labs: 05/27/2021: ALT 50; BUN 21; Creatinine, Ser 1.00; Hemoglobin 16.4; Platelets 169; Potassium 4.2; Sodium 135  Recent Lipid Panel    Component Value Date/Time   CHOL 80 05/27/2021 0839   CHOL 103 08/26/2019 0838   TRIG 420 (H) 05/27/2021 0839   HDL 32 (L) 05/27/2021 0839   HDL 29 (L) 08/26/2019 0838   CHOLHDL 2.5 05/27/2021 0839   VLDL UNABLE TO CALCULATE IF TRIGLYCERIDE OVER 400 mg/dL 05/27/2021 0839   LDLCALC UNABLE TO CALCULATE IF TRIGLYCERIDE OVER 400 mg/dL 05/27/2021 0839   LDLCALC Comment (A) 08/26/2019 0838   LDLCALC  04/12/2019 1047     Comment:     . LDL cholesterol not calculated. Triglyceride levels greater than 400 mg/dL invalidate calculated LDL results. . Reference range: <100 . Desirable range <100 mg/dL for primary prevention;   <70 mg/dL for patients with CHD or diabetic patients  with > or = 2 CHD risk factors. [name] LDL-C is now calculated using the Martin-Hopkins  calculation, which is a validated novel method providing  better accuracy than the Friedewald equation in the  estimation of LDL-C.  Cresenciano Genre et al. Annamaria Helling. 3810;175(10): 2061-2068  (http://education.[employer].com/faq/FAQ164)    LDLDIRECT <10 05/27/2021 0839    PHYSICAL EXAM:    VS:  BP (!) 144/92   Pulse 88   Ht 6' (1.829 m)   Wt 228 lb (103.4 kg)   SpO2 98%   BMI 30.92 kg/m   BMI: Body mass index is 30.92 kg/m.  GEN: Well nourished, well developed  male in no  acute distress HEENT: normocephalic, atraumatic Neck: no JVD, carotid bruits, or masses Cardiac: RRR; no murmurs, rubs, or gallops, no edema  Respiratory:  clear to auscultation bilaterally, normal work of breathing GI: soft, nontender, nondistended, + BS MS: no deformity or atrophy Skin: warm and dry, no rash Neuro:  Alert and Oriented x 3, Strength and sensation are intact, follows commands Psych: euthymic mood, full affect  Wt Readings from Last 3 Encounters:  05/26/22 228 lb (103.4 kg)  05/18/21 226 lb (102.5 kg)  11/02/20 225 lb 9.6 oz (102.3 kg)     ASSESSMENT & PLAN:   1. CAD s/p CABG  - doing well without accelerating angina. Discussed increasing Imdur for his angina but he politely declined. At time of last cath 2016, it was recommended to continue DAPT long term so continue ASA + Effient. He has mild intermittent thrombocytopenia noted on labs that it appears primary care has been monitoring. He will otherwise continue his lipid regimen as outlined but plan to refer to Dr. Debara Pickett for evaluation given his persistently elevated triglyceride levels despite therapy with statin, Praluent, fenofibrate, omega-3 therapy.   2. Ischemic cardiomyopathy/HFrEF with essential HTN, elevated blood pressure today- appears euvolemic on examination. Continue Imdur, metoprolol, potassium, valsartan-HCTZ. He is also already on SGLT2i as well. BP is up today which he states is unusual. Would benefit from medication titration if recent pressures truly are elevated in this range at home as well. Instructions provided for monitoring over the next 1 week and relaying results to our office. Consideration would be given to changing metoprolol to carvedilol or adding spironolactone. Since his last BMET 01/2022 did not have the potassium reported out, I have recommended we recheck this. We had issues with our Epic going down in clinic this afternoon so he will return to have this drawn. Also discussed low  sodium diet.  3. Hyperlipidemia/hypertriglyceridemia - recent lipid panel reviewed as above. Will plan to refer to Dr. Debara Pickett for further input including management of persistent hypertriglyceridemia. He reports his triglycerides were 2200 at one point. He does report he got word from his insurance company that they want him to switch back to [place]. Have asked clinic MA to reach out to our pharmD team who historically helped manage his PCSK9i therapy to assist with switching back over.  4. Erectile dysfunction - he continues to report concern with this. Historically, PDE5i have not been able to be utilized due to his chronic nitrate use. Will refer to urology for evaluation.   This note had to be originally written in word application then transferred to patient's chart due to Epic downtime.      Disposition: F/u with Dr. Domenic Polite in 1 year, sooner if BP readings dictate.   Medication Adjustments/Labs and Tests Ordered: Current medicines are reviewed at length with the patient today.  Concerns regarding medicines are outlined above. Medication changes, Labs and Tests ordered today are summarized above and listed in the Patient Instructions accessible in Encounters.    Signed, Charlie Pitter, PA-C  05/26/2022 1:51 PM    [facility] Location in [place]. [place], [place] [address] Ph: [phone]; Fax [phone]

## 2022-05-26 ENCOUNTER — Ambulatory Visit (INDEPENDENT_AMBULATORY_CARE_PROVIDER_SITE_OTHER): Payer: No Typology Code available for payment source | Admitting: Physician Assistant

## 2022-05-26 ENCOUNTER — Encounter: Payer: Self-pay | Admitting: Physician Assistant

## 2022-05-26 VITALS — BP 152/92 | HR 88 | Ht 72.0 in | Wt 228.0 lb

## 2022-05-26 DIAGNOSIS — E785 Hyperlipidemia, unspecified: Secondary | ICD-10-CM | POA: Diagnosis not present

## 2022-05-26 DIAGNOSIS — I10 Essential (primary) hypertension: Secondary | ICD-10-CM | POA: Diagnosis not present

## 2022-05-26 DIAGNOSIS — E781 Pure hyperglyceridemia: Secondary | ICD-10-CM

## 2022-05-26 DIAGNOSIS — I2510 Atherosclerotic heart disease of native coronary artery without angina pectoris: Secondary | ICD-10-CM

## 2022-05-26 DIAGNOSIS — N529 Male erectile dysfunction, unspecified: Secondary | ICD-10-CM

## 2022-05-26 DIAGNOSIS — I255 Ischemic cardiomyopathy: Secondary | ICD-10-CM

## 2022-05-26 NOTE — Patient Instructions (Signed)
Medication Instructions:  Your physician recommends that you continue on your current medications as directed. Please refer to the Current Medication list given to you today.   Labwork: None  Testing/Procedures: None  Follow-Up: Follow up with Dr. Domenic Polite in 1 year.   Any Other Special Instructions Will Be Listed Below (If Applicable).  You have been referred to Urology. They will call you with your first appointment.  You have been referred to Hyperlipidemia Clinic. They will call you with your first appointment.    If you need a refill on your cardiac medications before your next appointment, please call your pharmacy.      We need to get a better idea of what your blood pressure is running at home. Here are some instructions to follow: - I would recommend using a blood pressure cuff that goes on your arm. The wrist ones can be inaccurate. If you're purchasing one for the first time, try to select one that also reports your heart rate because this can be helpful information as well. - To check your blood pressure, choose a time at least 3 hours after taking your blood pressure medicines. If you can sample it at different times of the day, that's great - it might give you more information about how your blood pressure fluctuates. Remain seated in a chair for 5 minutes quietly beforehand, then check it.  - Please record a list of those readings and call us/send in MyChart message with them for our review in 1 week.

## 2022-06-01 ENCOUNTER — Encounter: Payer: Self-pay | Admitting: *Deleted

## 2022-06-06 ENCOUNTER — Telehealth: Payer: Self-pay | Admitting: Pharmacist

## 2022-06-06 MED ORDER — REPATHA SURECLICK 140 MG/ML ~~LOC~~ SOAJ: 1.0000 | SUBCUTANEOUS | 11 refills | Status: DC

## 2022-06-06 NOTE — Telephone Encounter (Signed)
-----   Message from Berlinda Last, [place] sent at 06/06/2022  7:17 AM EDT ----- Good morning! Dayna Dunn PA-C, asked me to send Pharm D a message to let you guys know that this pt's PCSK9i needs to be changed. (insurance recently asked him to switch).  Thanks!  Genia Del., CMA

## 2022-06-06 NOTE — Telephone Encounter (Addendum)
Repatha PA approved, refill sent to pharmacy and pt aware. He'll see if pharmacy still has old copay card info stored on file and if not, was provided with Repatha Ready # to call and get his copay card info again.

## 2022-06-06 NOTE — Telephone Encounter (Signed)
Pt used to take Repatha before his insurance mandated he change to Praluent, now they're making him change back to [place]. Will submit prior authorization.

## 2022-06-27 ENCOUNTER — Telehealth: Payer: Self-pay | Admitting: Cardiology

## 2022-06-27 NOTE — Telephone Encounter (Signed)
Pt would like a callback regarding results from another provider. Pt states that the provider would like to put him on a medication due to this. Please advise

## 2022-06-27 NOTE — Telephone Encounter (Signed)
I have forwarded Dr.McDowell's response  to patient via [facility]

## 2022-06-27 NOTE — Telephone Encounter (Signed)
PCP Pablo Lawrence) told patient his testosterone is low and that would explain his fatigue. Patient states PCP wants cardiology approval because patient states testosterone can enlarge his heart.    Please advise

## 2022-07-04 ENCOUNTER — Encounter: Payer: Self-pay | Admitting: Internal Medicine

## 2022-08-01 ENCOUNTER — Other Ambulatory Visit: Payer: Self-pay | Admitting: Cardiology

## 2022-08-28 ENCOUNTER — Other Ambulatory Visit: Payer: Self-pay | Admitting: Cardiology

## 2022-09-11 ENCOUNTER — Other Ambulatory Visit: Payer: Self-pay | Admitting: Cardiology

## 2022-10-16 IMAGING — XA DG FLUORO GUIDE NDL PLC/BX
2 series · 2 of 2 positions shown · IV contrast (multihance)
Comparison: none

CLINICAL DATA: 59-year-old male with right shoulder pain.

EXAM:
RIGHT SHOULDER INJECTION UNDER FLUOROSCOPY
TECHNIQUE: An appropriate skin entrance site was determined. The site was
marked, prepped with Betadine, draped in the usual sterile fashion,
and infiltrated locally with buffered Lidocaine. 22 gauge spinal
needle was advanced to the superomedial margin of the humeral head
under intermittent fluoroscopy. 1 ml of Lidocaine injected easily. A
mixture of 0.05 ml Multihance 10 ml of Omnipaque 300 was then used
to opacify the right shoulder capsule. No immediate complication.
FLUOROSCOPY TIME:  Fluoroscopy Time:  37 seconds
Radiation Exposure Index (if provided by the fluoroscopic device):
1.09 mGy
Number of Acquired Spot Images: 2

[Series 1: ortho adipose · 1 of 1 slices shown (1 of 2)]
[im 1/1]
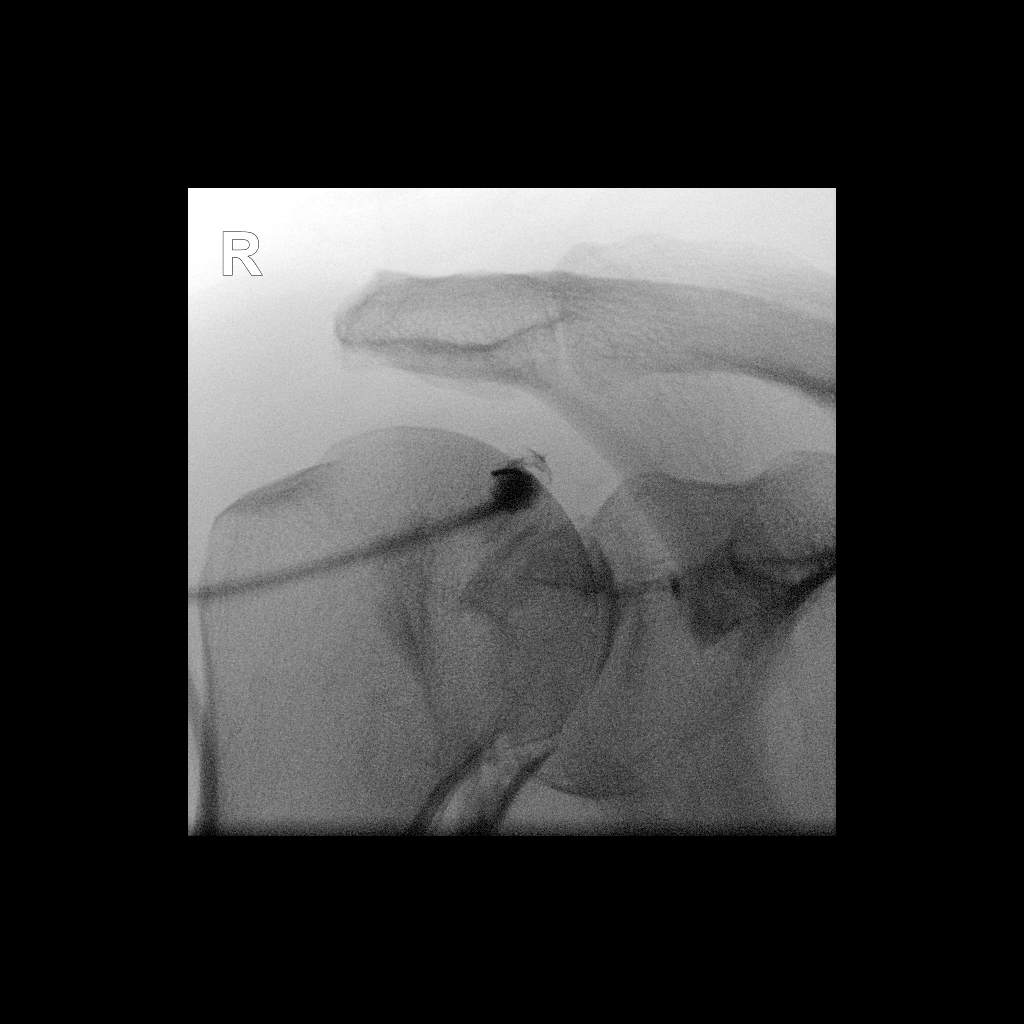

[Series 2: ortho adipose · 1 of 1 slices shown (2 of 2)]
[im 1/1]
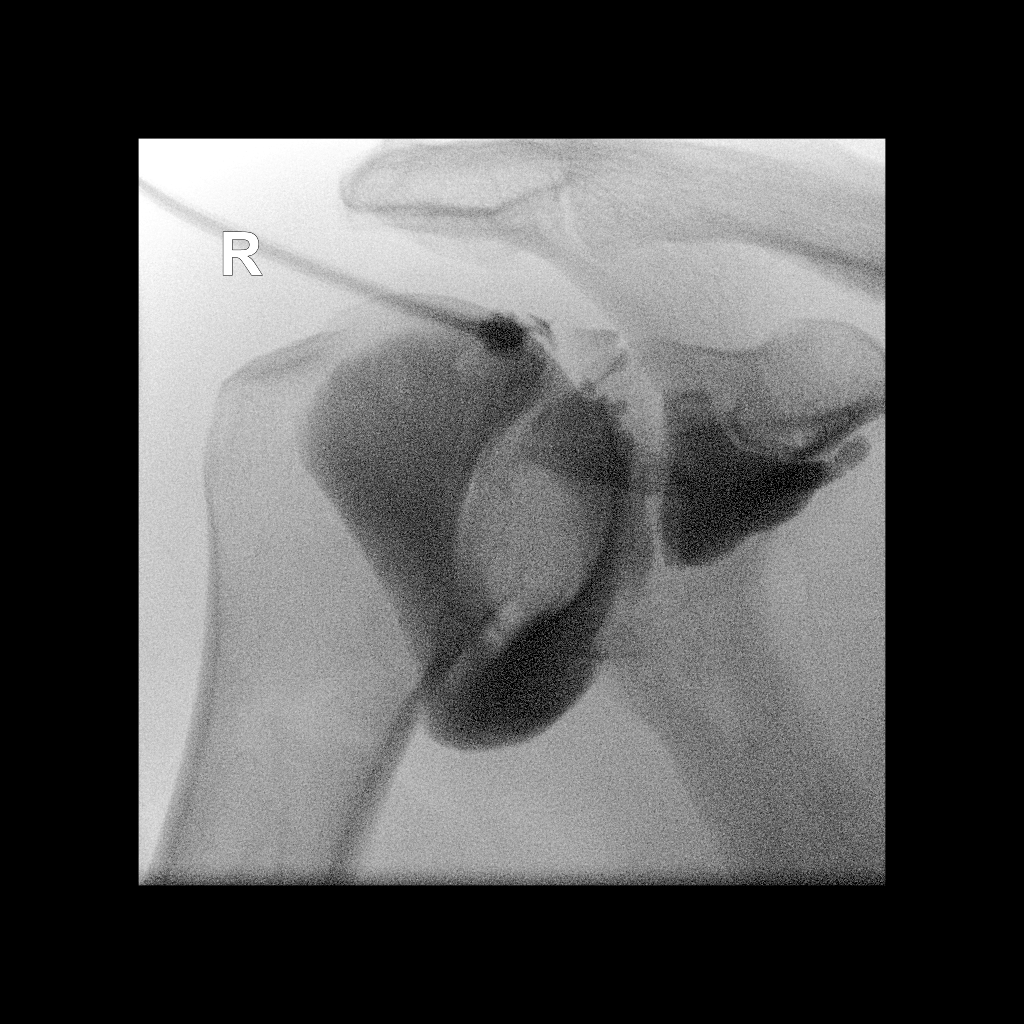

[2 of 2 positions shown; findings below may reference images not displayed]

FINDINGS: Technically successful opacification of the right shoulder joint
under fluoroscopic guidance.
IMPRESSION: Technically successful right shoulder injection for MRI.

## 2022-10-16 IMAGING — CR DG ORBITS FOR FOREIGN BODY
2 series · 2 of 2 positions shown · non-contrast
Comparison: 04/27/2007

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

EXAM:
ORBITS FOR FOREIGN BODY - 2 VIEW

[w orbit pa]
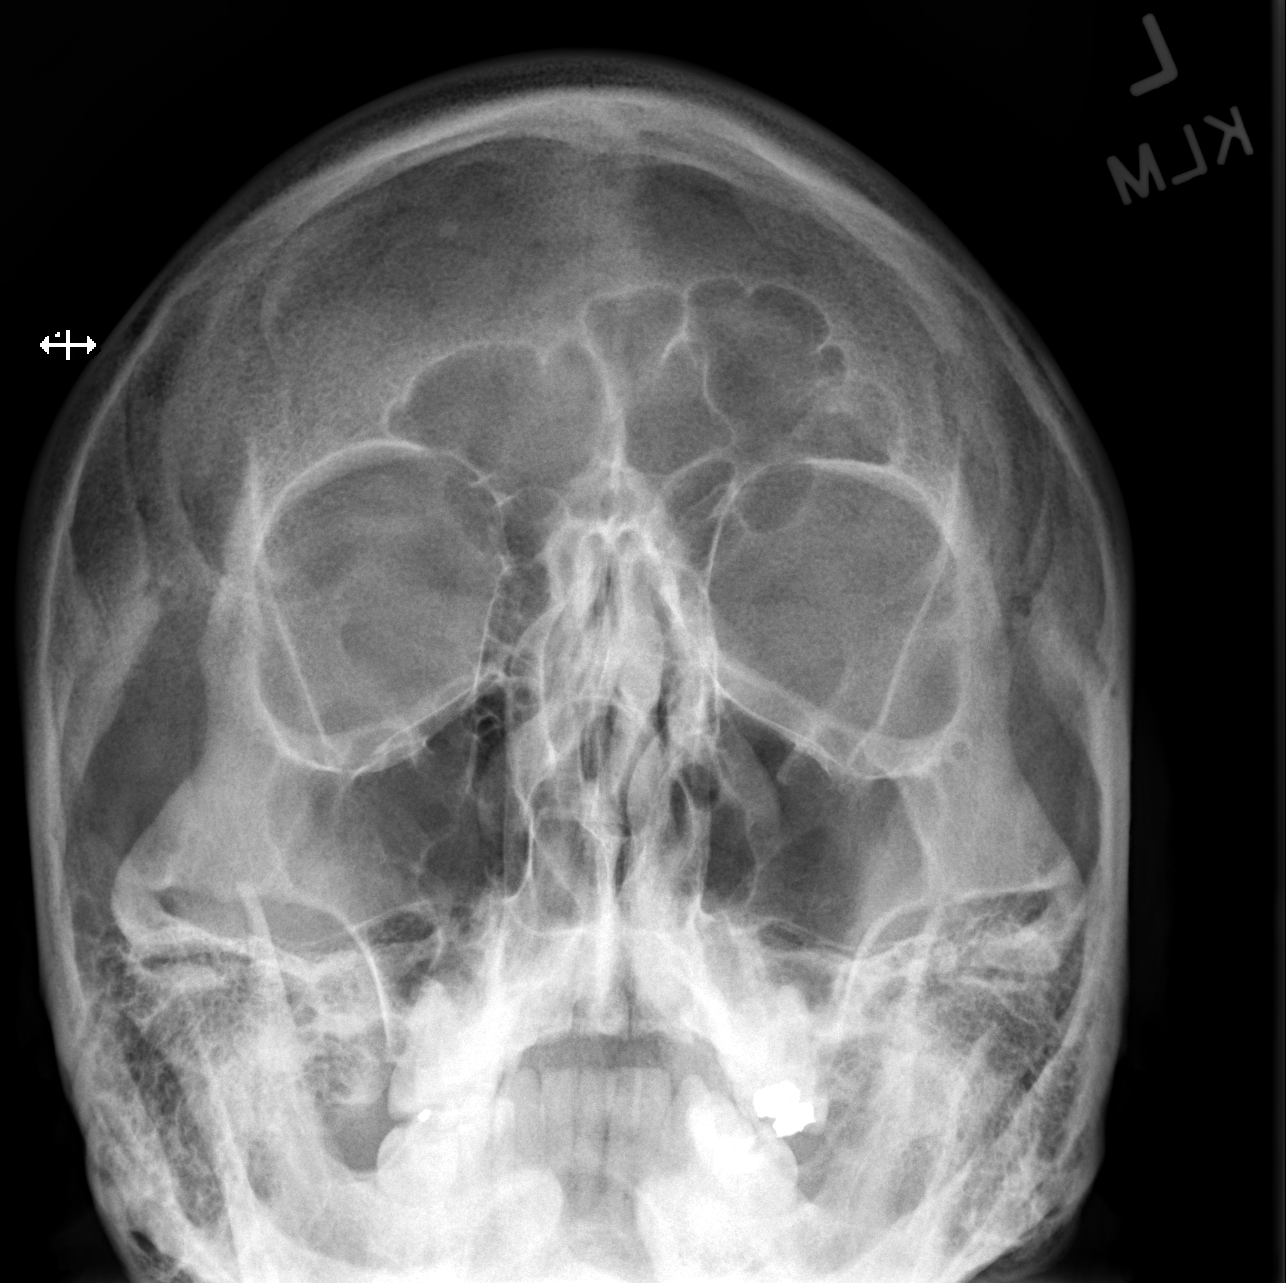

[w skull lat]
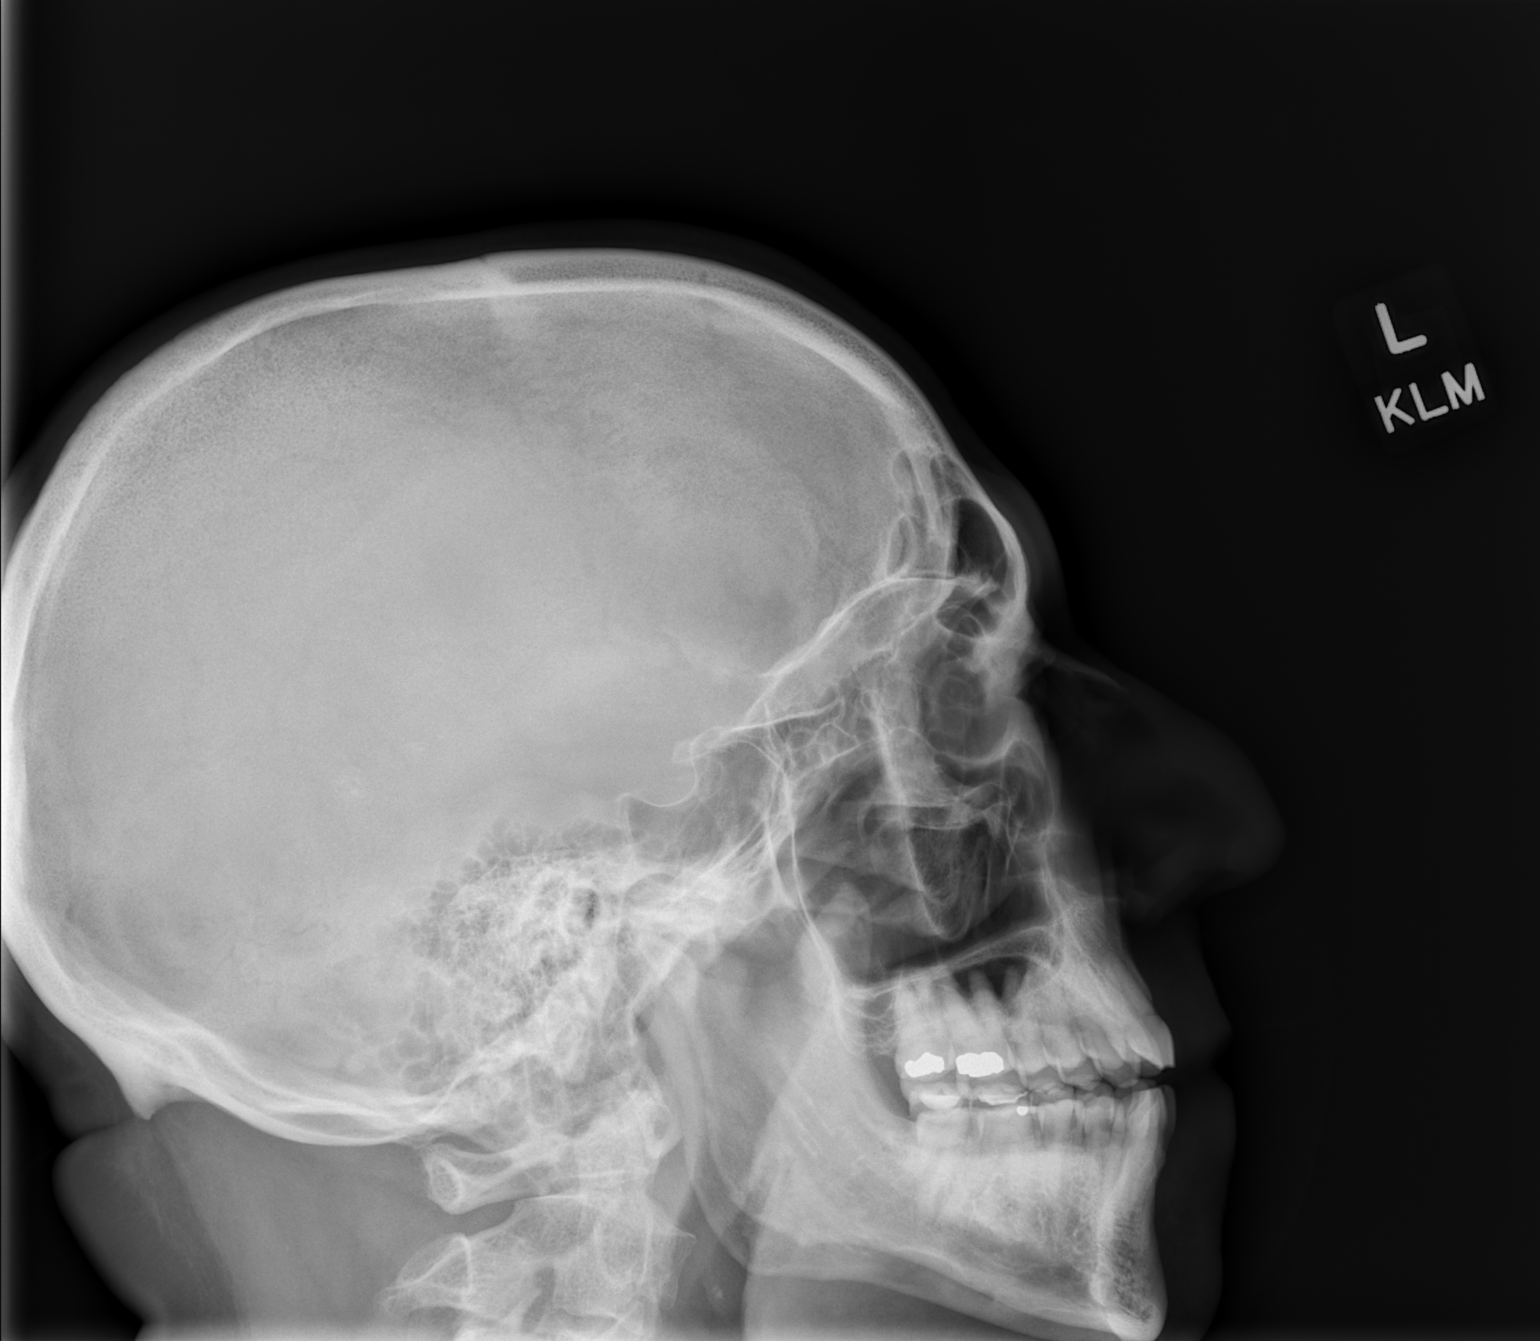

[2 of 2 positions shown; findings below may reference images not displayed]

FINDINGS: There is no evidence of metallic foreign body within the orbits. No
significant bone abnormality identified.
IMPRESSION: No evidence of metallic foreign body within the orbits.

## 2022-10-21 ENCOUNTER — Ambulatory Visit (INDEPENDENT_AMBULATORY_CARE_PROVIDER_SITE_OTHER): Payer: No Typology Code available for payment source | Admitting: Orthopedic Surgery

## 2022-10-21 ENCOUNTER — Encounter: Payer: Self-pay | Admitting: Orthopedic Surgery

## 2022-10-21 DIAGNOSIS — M79641 Pain in right hand: Secondary | ICD-10-CM | POA: Diagnosis not present

## 2022-10-21 DIAGNOSIS — G5601 Carpal tunnel syndrome, right upper limb: Secondary | ICD-10-CM | POA: Diagnosis not present

## 2022-10-21 NOTE — Progress Notes (Signed)
Office Visit Note   Patient: [name]           Date of Birth: Aug 23, 1961           MRN: [id number] Visit Date: 10/21/2022 Requested by: No referring provider defined for this encounter. PCP: Pcp, No  Subjective: Chief Complaint  Patient presents with   Right Hand - Numbness    HPI: [name] is a 61 y.o. male who presents to the office reporting right hand pain.  Had left carpal tunnel release 10 years ago.  Had bilateral carpal tunnel syndrome by EMG nerve testing at that time.  He states that he has relatively constant numbness and tingling in the palmar aspect of his right hand.  Has had symptoms for years but they have been getting worse over the past several months.  Uses a brace at night.  Does have pain on and off through the day.  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  Denies any neck pain or radicular symptoms..                ROS: All systems reviewed are negative as they relate to the chief complaint within the history of present illness.  Patient denies fevers or chills.  Assessment & Plan: Visit Diagnoses:  1. Pain in right hand   2. Carpal tunnel syndrome, right upper limb     Plan: Impression is right hand pain with numbness and tingling consistent with carpal tunnel syndrome.  Plan is nerve conduction study of the right hand to evaluate for carpal tunnel syndrome with likely release shortly thereafter.  The risk and benefits of the procedure discussed with the patient include not limited to infection or vessel damage incomplete healing as well as loss of some grip strength for a period of several months after surgery.  Patient understands risk benefits and wishes to proceed.  All questions answered  Follow-Up Instructions: No follow-ups on file.   Orders:  Orders Placed This Encounter  Procedures   Ambulatory referral to Physical Medicine Rehab   No orders of the defined types were placed in this encounter.      Procedures: No procedures performed   Clinical Data: No additional findings.  Objective: Vital Signs: There were no vitals taken for this visit.  Physical Exam:  Constitutional: Patient appears well-developed HEENT:  Head: Normocephalic Eyes:EOM are normal Neck: Normal range of motion Cardiovascular: Normal rate Pulmonary/chest: Effort normal Neurologic: Patient is alert Skin: Skin is warm Psychiatric: Patient has normal mood and affect  Ortho Exam: Ortho exam demonstrates good cervical spine range of motion.  Negative Tinel's on the right-hand side of the ulnar nerve in the cubital tunnel.  No subluxation of the ulnar nerve.  5 out of 5 grip EPL FPL interosseous resection extension bicep triceps abductor strength with palpable radial pulse.  No masses lymphadenopathy or skin changes noted in that right hand region.  Abductor pollicis brevis strength is intact.  Specialty Comments:  No specialty comments available.  Imaging: No results found.   PMFS History: Patient Active Problem List   Diagnosis Date Noted   Encounter for screening colonoscopy 03/22/2018   Fatty liver 03/22/2018   GERD (gastroesophageal reflux disease) 03/22/2018   CAD S/P percutaneous coronary angioplasty 09/18/2015   Hyperlipidemia 09/18/2015   Thrombocytopenia ([place]) 09/18/2015   Diabetes mellitus type 2, diet-controlled ([place])    Ischemic cardiomyopathy    Ischemic chest pain ([place]) 09/17/2015   Exertional angina  09/17/2015   Type 2 diabetes mellitus ([place]) 01/21/2014   Preoperative cardiovascular examination 07/03/2013   Essential hypertension 01/29/2010   ANGINA, STABLE 09/01/2009   CORONARY ATHEROSCLEROSIS NATIVE CORONARY ARTERY 09/01/2009   DYSLIPIDEMIA 08/31/2009   Past Medical History:  Diagnosis Date   Coronary atherosclerosis of native coronary artery    a. remote LAD stent. b. subsequent stents to RCA with restenosis. c. DES to RCA and Circ 06/2007 in setting of restenosis. d. DES to  LAD & RCA in 01/2010. e. DES to Cx and LAD; total occlusion of RCA, LVEF 40-45%.   Diabetes mellitus type 2, diet-controlled ([place])    Essential hypertension    Hepatic steatosis    Hyperlipidemia    Ischemic cardiomyopathy    MI (myocardial infarction) ([place])    Obstructive sleep apnea    Thrombocytopenia ([facility])     Family History  Problem Relation Age of Onset   Coronary artery disease Other    Hypertension Mother    Diabetes Mother    Heart attack Mother    Diabetes Father    CVA Father    Diabetes Brother    Hypertension Brother    Colon cancer Neg Hx    Colon polyps Neg Hx     Past Surgical History:  Procedure Laterality Date   BIOPSY  05/04/2018   Procedure: BIOPSY;  Surgeon: Danie Binder, MD;  [facility];  Service: Endoscopy;;  gastric   CARDIAC CATHETERIZATION N/A 09/17/2015   Procedure: Left Heart Cath and Coronary Angiography;  Surgeon: [name], MD;  Location: [facility];  Service: Cardiovascular;  Laterality: N/A;   COLONOSCOPY N/A 05/04/2018   Procedure: COLONOSCOPY;  Surgeon: Danie Binder, MD;  [facility];  Service: Endoscopy;  Laterality: N/A;  11:00am   CORONARY ANGIOPLASTY WITH STENT PLACEMENT     left carpal tunnel     POLYPECTOMY  05/04/2018   Procedure: POLYPECTOMY;  Surgeon: Danie Binder, MD;  [facility];  Service: Endoscopy;;  ascending,rectal,gastric, duodenal,   ROTATOR CUFF REPAIR     WISDOM TOOTH EXTRACTION     Social History   Occupational History   Occupation: Full-time    Employer: [employer]  Tobacco Use   Smoking status: Former    Types: Cigarettes    Start date: 11/21/1977    Quit date: 08/04/2000    Years since quitting: 22.2   Smokeless tobacco: Never   Tobacco comments:    Tobacco - no  Vaping Use   Vaping Use: Never used  Substance and Sexual Activity   Alcohol use: No    Alcohol/week: 0.0 standard drinks of alcohol    Comment: rare, maybe 1 beer once a month     Drug use: No   Sexual activity: Not on file

## 2022-10-23 ENCOUNTER — Other Ambulatory Visit: Payer: Self-pay | Admitting: Cardiology

## 2022-11-01 ENCOUNTER — Ambulatory Visit (INDEPENDENT_AMBULATORY_CARE_PROVIDER_SITE_OTHER): Payer: No Typology Code available for payment source | Admitting: Physical Medicine and Rehabilitation

## 2022-11-01 DIAGNOSIS — R202 Paresthesia of skin: Secondary | ICD-10-CM

## 2022-11-01 NOTE — Progress Notes (Signed)
Numeric Pain Rating Scale and Functional Assessment Average Pain 0   In the last MONTH (on 0-10 scale) has pain interfered with the following?  1. General activity like being  able to carry out your everyday physical activities such as walking, climbing stairs, carrying groceries, or moving a chair?  Rating(0)   Right handed. Numbness, weakness and pain in right hand. Occasional radiation to the elbow. Worse first thing in the morning, completely numb, drops things occasionally. Soreness under the thumb

## 2022-11-02 ENCOUNTER — Encounter: Payer: Self-pay | Admitting: Cardiology

## 2022-11-03 ENCOUNTER — Telehealth: Payer: Self-pay | Admitting: Cardiology

## 2022-11-03 NOTE — Telephone Encounter (Signed)
Patient reached out to primary cardiologist Dr. Domenic Polite regarding request to hold antiplatelet medications prior to carpal tunnel release. Per Dr. Domenic Polite:  These medications can be held as requested, however there will still be some antiplatelet effect as they have not completely washed out in 3 days.   Dr. Domenic Polite forwarded message to Dr. Marlou Sa who replied:  Faythe Ghee to stop the Effient and aspirin today with last dose today.  No Effient and aspirin  Friday Saturday Sunday.  Okay to resume Effient and aspirin Monday evening   I contacted the patient to verify that he is able to achieve > 4 METS activity without chest pain, dyspnea, or other concerning symptoms. The patient is doing well from a cardiac perspective. Therefore, based on ACC/AHA guidelines, the patient would be at acceptable risk for the planned procedure without further cardiovascular testing. He is aware of the instructions regarding holding his anti-platelet medications. He thanked [place] for our assistance.    Emmaline Life, NP-C  11/03/2022, 1:26 PM [address] Office [phone] Fax [phone]

## 2022-11-03 NOTE — Telephone Encounter (Signed)
   Pre-operative Risk Assessment    Patient Name: [name]  DOB: 1961/02/18 MRN: [id number]      Request for Surgical Clearance    Procedure:   Right Carpal Tunnel Release   Date of Surgery:  Clearance 11/07/22                                 Surgeon:  Dr. Alphonzo Severance  Surgeon's Group or Practice Name:  [facility] Phone number:  [phone] Fax number:  [phone]   Type of Clearance Requested:   - Medical  Pharmacy ok to stop blood thinners 3 days prior to surgery?   Type of Anesthesia:  Not Indicated   Additional requests/questions:    Sandrea Hammond   11/03/2022, 12:14 PM

## 2022-11-03 NOTE — Telephone Encounter (Signed)
Calling to get a fax back for the clearance. Please advise

## 2022-11-03 NOTE — Telephone Encounter (Signed)
Please see most recent patient message--  Patient is scheduled for Carpal Tunnel surgery on 11/07/22.  Per Kathrynn Humble, Dr. Marlou Sa is requesting advisement on blood thinner therapy--will need to hold 3 days prior to procedure. Jackelyn Poling states that the clearance request was faxed to the [place] office on 12/13.  Debbie plans to fax the request to the [place] office as well as she states that she did not receive confirmation that the fax went through. Please return call to confirm received and provide any updates to [facility] at [phone]. Jackelyn Poling also provided her personal #, [phone].

## 2022-11-03 NOTE — Telephone Encounter (Signed)
Please see communication between patient and Dr. Domenic Polite dated 11/02/22.

## 2022-11-03 NOTE — Telephone Encounter (Signed)
thanks

## 2022-11-07 ENCOUNTER — Other Ambulatory Visit: Payer: Self-pay | Admitting: Surgical

## 2022-11-07 DIAGNOSIS — G5601 Carpal tunnel syndrome, right upper limb: Secondary | ICD-10-CM

## 2022-11-07 MED ORDER — TRAMADOL HCL 50 MG PO TABS: 50.0000 mg | ORAL_TABLET | Freq: Four times a day (QID) | ORAL | 0 refills | Status: DC | PRN

## 2022-11-07 NOTE — Progress Notes (Signed)
[name] - 61 y.o. male MRN [id number]  Date of birth: September 14, 1961  Office Visit Note: Visit Date: 11/01/2022 PCP: Pcp, No Referred by: Meredith Pel, MD  Subjective: Chief Complaint  Patient presents with   Right Hand - Numbness, Pain, Weakness   HPI:  [name] is a 61 y.o. male who comes in today at the request of Dr. Anderson Malta for evaluation and management of the Right upper extremities.  Patient is Right hand dominant.  He describes pain numbness and tingling chronically and worsening in the right hand and the radial digits.  He feels like it is very similar to what he had some years ago and underwent left carpal tunnel release with good relief of symptoms at the time.  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  Incidentally he did have mild right median neuropathy at the time.  He denies any frank radicular symptoms.  He is not diabetic.   ROS Otherwise per HPI.  Assessment & Plan: Visit Diagnoses:    ICD-10-CM   1. 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   There is no significant electrodiagnostic evidence of any other focal nerve entrapment, brachial plexopathy or cervical radiculopathy.   Recommendations: 1.  Follow-up with referring physician. 2.  Continue current management of symptoms. 3.  Suggest surgical evaluation.  Meds & Orders: No orders of the defined types were placed in this encounter.   Orders Placed This Encounter  Procedures   NCV with EMG (electromyography)    Follow-up: Return in about 2 weeks (around 11/15/2022) for  Anderson Malta,  MD.   Procedures: No procedures performed  EMG & NCV Findings: Evaluation of the right median motor nerve showed prolonged distal onset latency (4.5 ms) and decreased conduction velocity (Elbow-Wrist, 46 m/s).  The right median (across palm) sensory nerve showed prolonged distal peak latency (Wrist, 4.6 ms), reduced amplitude (7.1 V), and prolonged distal peak latency (Palm, 2.4 ms).  All remaining nerves (as indicated in the following tables) were within normal limits.    All examined muscles (as indicated in the following table) showed no evidence of electrical instability.    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   There is no significant electrodiagnostic evidence of any other focal nerve entrapment, brachial plexopathy or cervical radiculopathy.   Recommendations: 1.  Follow-up with referring physician. 2.  Continue current management of symptoms. 3.  Suggest surgical evaluation.  ___________________________ Laurence Spates FAAPMR Board Certified, American Board of Physical Medicine and Rehabilitation    Nerve Conduction Studies Anti Sensory Summary Table   Stim Site NR Peak (ms) Norm Peak (ms) P-T Amp (V) Norm P-T Amp Site1 Site2 Delta-P (ms) Dist (cm) Vel (m/s) Norm Vel (m/s)  Right Median Acr Palm Anti Sensory (2nd Digit)  30.8C  Wrist    *4.6 <3.6 *7.1 >10 Wrist Palm 2.2 0.0    Palm    *2.4 <2.0 2.6  Right Radial Anti Sensory (Base 1st Digit)  31.2C  Wrist    2.2 <3.1 19.7  Wrist Base 1st Digit 2.2 0.0    Right Ulnar Anti Sensory (5th Digit)  31.4C  Wrist    3.1 <3.7 23.9 >15.0 Wrist 5th Digit 3.1 14.0 45 >38   Motor Summary Table   Stim Site NR Onset (ms) Norm Onset (ms) O-P Amp (mV) Norm O-P Amp Site1 Site2 Delta-0 (ms) Dist (cm) Vel (m/s) Norm Vel (m/s)  Right Median Motor (Abd Poll  Brev)  31.2C  Wrist    *4.5 <4.2 5.1 >5 Elbow Wrist 4.6 21.0 *46 >50  Elbow    9.1  4.8         Right Ulnar Motor (Abd Dig Min)  31.3C  Wrist    3.0 <4.2 8.5 >3 B Elbow Wrist 3.8 22.0 58 >53  B Elbow    6.8  8.2  A Elbow B Elbow 1.6 11.0 69 >53  A Elbow    8.4  7.8          EMG   Side Muscle Nerve Root Ins Act Fibs Psw Amp Dur Poly Recrt Int Fraser Din Comment  Right Abd Poll Brev Median C8-T1 Nml Nml Nml Nml Nml 0 Nml Nml   Right 1stDorInt Ulnar C8-T1 Nml Nml Nml Nml Nml 0 Nml Nml   Right PronatorTeres Median C6-7 Nml Nml Nml Nml Nml 0 Nml Nml   Right Biceps Musculocut C5-6 Nml Nml Nml Nml Nml 0 Nml Nml   Right Deltoid Axillary C5-6 Nml Nml Nml Nml Nml 0 Nml Nml     Nerve Conduction Studies Anti Sensory Left/Right Comparison   Stim Site L Lat (ms) R Lat (ms) L-R Lat (ms) L Amp (V) R Amp (V) L-R Amp (%) Site1 Site2 L Vel (m/s) R Vel (m/s) L-R Vel (m/s)  Median Acr Palm Anti Sensory (2nd Digit)  30.8C  Wrist  *4.6   *7.1  Wrist Palm     Palm  *2.4   2.6        Radial Anti Sensory (Base 1st Digit)  31.2C  Wrist  2.2   19.7  Wrist Base 1st Digit     Ulnar Anti Sensory (5th Digit)  31.4C  Wrist  3.1   23.9  Wrist 5th Digit  45    Motor Left/Right Comparison   Stim Site L Lat (ms) R Lat (ms) L-R Lat (ms) L Amp (mV) R Amp (mV) L-R Amp (%) Site1 Site2 L Vel (m/s) R Vel (m/s) L-R Vel (m/s)  Median Motor (Abd Poll Brev)  31.2C  Wrist  *4.5   5.1  Elbow Wrist  *46   Elbow  9.1   4.8        Ulnar Motor (Abd Dig Min)  31.3C  Wrist  3.0   8.5  B Elbow Wrist  58   B Elbow  6.8   8.2  A Elbow B Elbow  69   A Elbow  8.4   7.8           Waveforms:             Clinical History: No specialty comments available.     Objective:  VS:  HT:    WT:   BMI:     BP:   HR: bpm  TEMP: ( )  RESP:  Physical Exam Musculoskeletal:        General: No tenderness.     Comments: Inspection reveals carpal tunnel  release scar but no atrophy of the bilateral APB or FDI or hand intrinsics.  There is no swelling, color changes, allodynia or dystrophic changes. There is 5 out of 5 strength in the bilateral wrist extension, finger abduction and long finger flexion. There is intact sensation to light touch in all dermatomal and peripheral nerve distributions. There is a negative Hoffmann's test bilaterally.  Skin:    General: Skin is warm and dry.     Findings: No erythema or rash.  Neurological:     General: No focal deficit present.     Mental Status: He is alert and oriented to person, place, and time.     Sensory: No sensory deficit.     Motor: No weakness or abnormal muscle tone.     Coordination: Coordination normal.     Gait: Gait normal.  Psychiatric:        Mood and Affect: Mood normal.        Behavior: Behavior normal.        Thought Content: Thought content normal.      Imaging: No results found.

## 2022-11-07 NOTE — Procedures (Signed)
EMG & NCV Findings: Evaluation of the right median motor nerve showed prolonged distal onset latency (4.5 ms) and decreased conduction velocity (Elbow-Wrist, 46 m/s).  The right median (across palm) sensory nerve showed prolonged distal peak latency (Wrist, 4.6 ms), reduced amplitude (7.1 V), and prolonged distal peak latency (Palm, 2.4 ms).  All remaining nerves (as indicated in the following tables) were within normal limits.    All examined muscles (as indicated in the following table) showed no evidence of electrical instability.    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   There is no significant electrodiagnostic evidence of any other focal nerve entrapment, brachial plexopathy or cervical radiculopathy.   Recommendations: 1.  Follow-up with referring physician. 2.  Continue current management of symptoms. 3.  Suggest surgical evaluation.  ___________________________ Laurence Spates FAAPMR Board Certified, American Board of Physical Medicine and Rehabilitation    Nerve Conduction Studies Anti Sensory Summary Table   Stim Site NR Peak (ms) Norm Peak (ms) P-T Amp (V) Norm P-T Amp Site1 Site2 Delta-P (ms) Dist (cm) Vel (m/s) Norm Vel (m/s)  Right Median Acr Palm Anti Sensory (2nd Digit)  30.8C  Wrist    *4.6 <3.6 *7.1 >10 Wrist Palm 2.2 0.0    Palm    *2.4 <2.0 2.6         Right Radial Anti Sensory (Base 1st Digit)  31.2C  Wrist    2.2 <3.1 19.7  Wrist Base 1st Digit 2.2 0.0    Right Ulnar Anti Sensory (5th Digit)  31.4C  Wrist    3.1 <3.7 23.9 >15.0 Wrist 5th Digit 3.1 14.0 45 >38   Motor Summary Table   Stim Site NR Onset (ms) Norm Onset (ms) O-P Amp (mV) Norm O-P Amp Site1 Site2 Delta-0 (ms) Dist (cm) Vel (m/s) Norm Vel (m/s)  Right Median Motor (Abd Poll Brev)  31.2C  Wrist    *4.5 <4.2 5.1 >5 Elbow  Wrist 4.6 21.0 *46 >50  Elbow    9.1  4.8         Right Ulnar Motor (Abd Dig Min)  31.3C  Wrist    3.0 <4.2 8.5 >3 B Elbow Wrist 3.8 22.0 58 >53  B Elbow    6.8  8.2  A Elbow B Elbow 1.6 11.0 69 >53  A Elbow    8.4  7.8          EMG   Side Muscle Nerve Root Ins Act Fibs Psw Amp Dur Poly Recrt Int Fraser Din Comment  Right Abd Poll Brev Median C8-T1 Nml Nml Nml Nml Nml 0 Nml Nml   Right 1stDorInt Ulnar C8-T1 Nml Nml Nml Nml Nml 0 Nml Nml   Right PronatorTeres Median C6-7 Nml Nml Nml Nml Nml 0 Nml Nml   Right Biceps Musculocut C5-6 Nml Nml Nml Nml Nml 0 Nml Nml   Right Deltoid Axillary C5-6 Nml Nml Nml Nml Nml 0 Nml Nml     Nerve Conduction Studies Anti Sensory Left/Right Comparison   Stim Site L Lat (ms) R Lat (ms) L-R Lat (ms) L Amp (V) R Amp (V) L-R Amp (%) Site1 Site2 L Vel (m/s) R Vel (m/s) L-R Vel (m/s)  Median Acr Palm Anti Sensory (2nd Digit)  30.8C  Wrist  *4.6   *7.1  Wrist Palm     Palm  *2.4   2.6  Radial Anti Sensory (Base 1st Digit)  31.2C  Wrist  2.2   19.7  Wrist Base 1st Digit     Ulnar Anti Sensory (5th Digit)  31.4C  Wrist  3.1   23.9  Wrist 5th Digit  45    Motor Left/Right Comparison   Stim Site L Lat (ms) R Lat (ms) L-R Lat (ms) L Amp (mV) R Amp (mV) L-R Amp (%) Site1 Site2 L Vel (m/s) R Vel (m/s) L-R Vel (m/s)  Median Motor (Abd Poll Brev)  31.2C  Wrist  *4.5   5.1  Elbow Wrist  *46   Elbow  9.1   4.8        Ulnar Motor (Abd Dig Min)  31.3C  Wrist  3.0   8.5  B Elbow Wrist  58   B Elbow  6.8   8.2  A Elbow B Elbow  69   A Elbow  8.4   7.8           Waveforms:

## 2022-11-18 ENCOUNTER — Encounter: Payer: Self-pay | Admitting: Surgical

## 2022-11-18 ENCOUNTER — Ambulatory Visit (INDEPENDENT_AMBULATORY_CARE_PROVIDER_SITE_OTHER): Payer: No Typology Code available for payment source | Admitting: Surgical

## 2022-11-18 ENCOUNTER — Other Ambulatory Visit: Payer: Self-pay | Admitting: Surgical

## 2022-11-18 DIAGNOSIS — G5601 Carpal tunnel syndrome, right upper limb: Secondary | ICD-10-CM

## 2022-11-18 NOTE — Progress Notes (Signed)
Post-Op Visit Note   Patient: [name]           Date of Birth: [birth year]           MRN: [id number] Visit Date: 11/18/2022 PCP: Pcp, No   Assessment & Plan:  Chief Complaint:  Chief Complaint  Patient presents with   Right Hand - Routine Post Op    R CTR (surgery date 11-07-22)   Visit Diagnoses: No diagnosis found.  Plan: Patient is a 61 year old male who returns s/p right carpal tunnel release on 11/07/2022.  States that he has significant improvement of the numbness in his right hand he was having prior to surgery.  Does have some soreness around the palm of the hand but this pain is improving since surgery.  Denies any fevers or chills.  Has not had any drainage from the incision.  Pain is steadily improving.  He has tried to be compliant with the no lifting restriction but he did help his wife put together a stationary bike which caused some increased pain in his hand.  On exam, patient has incision that is healing well.  Sutures are intact.  There is no cellulitis or any evidence of dehiscence of the incision.  Sutures will be left in until next week when they will be removed around the 2-week mark.  Patient has intact EPL, FPL, finger abduction, grip strength.  Grip strength and FPL strength rated 5 out of/5.  2+ radial pulse of the operative extremity.  Plan is return next Tuesday for clinical recheck and removal of sutures.  Strongly encourage patient to avoid any lifting with the operative hand and avoid submersion underwater.  Follow-Up Instructions: No follow-ups on file.   Orders:  No orders of the defined types were placed in this encounter.  No orders of the defined types were placed in this encounter.   Imaging: No results found.  PMFS History: Patient Active Problem List   Diagnosis Date Noted   Encounter for screening colonoscopy 03/22/2018   Fatty liver 03/22/2018   GERD (gastroesophageal reflux disease) 03/22/2018   CAD S/P percutaneous coronary  angioplasty 09/18/2015   Hyperlipidemia 09/18/2015   Thrombocytopenia ([place]) 09/18/2015   Diabetes mellitus type 2, diet-controlled ([place])    Ischemic cardiomyopathy    Ischemic chest pain ([place]) 09/17/2015   Exertional angina 09/17/2015   Type 2 diabetes mellitus ([place]) 01/21/2014   Preoperative cardiovascular examination 07/03/2013   Essential hypertension 01/29/2010   ANGINA, STABLE 09/01/2009   CORONARY ATHEROSCLEROSIS NATIVE CORONARY ARTERY 09/01/2009   DYSLIPIDEMIA 08/31/2009   Past Medical History:  Diagnosis Date   Coronary atherosclerosis of native coronary artery    a. remote LAD stent. b. subsequent stents to RCA with restenosis. c. DES to RCA and Circ 06/2007 in setting of restenosis. d. DES to LAD & RCA in 01/2010. e. DES to Cx and LAD; total occlusion of RCA, LVEF 40-45%.   Diabetes mellitus type 2, diet-controlled ([place])    Essential hypertension    Hepatic steatosis    Hyperlipidemia    Ischemic cardiomyopathy    MI (myocardial infarction) ([place])    Obstructive sleep apnea    Thrombocytopenia ([facility])     Family History  Problem Relation Age of Onset   Coronary artery disease Other    Hypertension Mother    Diabetes Mother    Heart attack Mother    Diabetes Father    CVA Father    Diabetes Brother    Hypertension Brother  Colon cancer Neg Hx    Colon polyps Neg Hx     Past Surgical History:  Procedure Laterality Date   BIOPSY  05/04/2018   Procedure: BIOPSY;  Surgeon: Danie Binder, MD;  [facility];  Service: Endoscopy;;  gastric   CARDIAC CATHETERIZATION N/A 09/17/2015   Procedure: Left Heart Cath and Coronary Angiography;  Surgeon: [name], MD;  Location: [facility];  Service: Cardiovascular;  Laterality: N/A;   COLONOSCOPY N/A 05/04/2018   Procedure: COLONOSCOPY;  Surgeon: Danie Binder, MD;  [facility];  Service: Endoscopy;  Laterality: N/A;  11:00am   CORONARY ANGIOPLASTY WITH STENT PLACEMENT     left carpal  tunnel     POLYPECTOMY  05/04/2018   Procedure: POLYPECTOMY;  Surgeon: Danie Binder, MD;  [facility];  Service: Endoscopy;;  ascending,rectal,gastric, duodenal,   ROTATOR CUFF REPAIR     WISDOM TOOTH EXTRACTION     Social History   Occupational History   Occupation: Full-time    Employer: [employer]  Tobacco Use   Smoking status: Former    Types: Cigarettes    Start date: 11/21/1977    Quit date: 08/04/2000    Years since quitting: 22.3   Smokeless tobacco: Never   Tobacco comments:    Tobacco - no  Vaping Use   Vaping Use: Never used  Substance and Sexual Activity   Alcohol use: No    Alcohol/week: 0.0 standard drinks of alcohol    Comment: rare, maybe 1 beer once a month    Drug use: No   Sexual activity: Not on file

## 2022-11-22 ENCOUNTER — Ambulatory Visit (INDEPENDENT_AMBULATORY_CARE_PROVIDER_SITE_OTHER): Payer: No Typology Code available for payment source | Admitting: Orthopedic Surgery

## 2022-11-22 ENCOUNTER — Telehealth: Payer: Self-pay | Admitting: Orthopedic Surgery

## 2022-11-22 DIAGNOSIS — G5601 Carpal tunnel syndrome, right upper limb: Secondary | ICD-10-CM

## 2022-11-22 MED ORDER — DOXYCYCLINE MONOHYDRATE 100 MG PO CAPS: 100.0000 mg | ORAL_CAPSULE | Freq: Two times a day (BID) | ORAL | 0 refills | Status: DC

## 2022-11-22 NOTE — Telephone Encounter (Signed)
Appt for 11/29/22  morning early.. please call to confirm..[phone]

## 2022-11-23 ENCOUNTER — Encounter: Payer: No Typology Code available for payment source | Admitting: Orthopedic Surgery

## 2022-11-23 ENCOUNTER — Encounter: Payer: Self-pay | Admitting: Orthopedic Surgery

## 2022-11-23 NOTE — Telephone Encounter (Signed)
IC to schedule. Dr Marlou Sa in surgery on the 9th. Scheduled for pm of the 8th

## 2022-11-23 NOTE — Progress Notes (Signed)
Post-Op Visit Note   Patient: [name]           Date of Birth: 1961/04/10           MRN: [id number] Visit Date: 11/22/2022 PCP: Pcp, No   Assessment & Plan:  Chief Complaint:  Chief Complaint  Patient presents with   Right Hand - Follow-up    11/07/22 right CTR   Visit Diagnoses:  1. Carpal tunnel syndrome, right upper limb     Plan: [place] is a 62 year old patient underwent right carpal tunnel release 11/07/2022.  On examination he has had a little bit of gapping of the incision.  No fluctuance or induration or drainage.  Patient is diabetic.  Plan is doxycycline 100 mg twice daily for 7 days.  Has just the slightest amount of tenderness around that proximal aspect on the ulnar side of the incision.  Do not think it is infected at this time but we will use antibiotics just in case.  7-day return for clinical recheck.  Abductor pollicis brevis strength is intact.  Follow-Up Instructions: No follow-ups on file.   Orders:  No orders of the defined types were placed in this encounter.  Meds ordered this encounter  Medications   doxycycline (MONODOX) 100 MG capsule    Sig: Take 1 capsule (100 mg total) by mouth 2 (two) times daily.    Dispense:  14 capsule    Refill:  0    Imaging: No results found.  PMFS History: Patient Active Problem List   Diagnosis Date Noted   Encounter for screening colonoscopy 03/22/2018   Fatty liver 03/22/2018   GERD (gastroesophageal reflux disease) 03/22/2018   CAD S/P percutaneous coronary angioplasty 09/18/2015   Hyperlipidemia 09/18/2015   Thrombocytopenia ([place]) 09/18/2015   Diabetes mellitus type 2, diet-controlled ([place])    Ischemic cardiomyopathy    Ischemic chest pain ([place]) 09/17/2015   Exertional angina 09/17/2015   Type 2 diabetes mellitus ([place]) 01/21/2014   Preoperative cardiovascular examination 07/03/2013   Essential hypertension 01/29/2010   ANGINA, STABLE 09/01/2009   CORONARY ATHEROSCLEROSIS NATIVE CORONARY ARTERY  09/01/2009   DYSLIPIDEMIA 08/31/2009   Past Medical History:  Diagnosis Date   Coronary atherosclerosis of native coronary artery    a. remote LAD stent. b. subsequent stents to RCA with restenosis. c. DES to RCA and Circ 06/2007 in setting of restenosis. d. DES to LAD & RCA in 01/2010. e. DES to Cx and LAD; total occlusion of RCA, LVEF 40-45%.   Diabetes mellitus type 2, diet-controlled ([place])    Essential hypertension    Hepatic steatosis    Hyperlipidemia    Ischemic cardiomyopathy    MI (myocardial infarction) ([place])    Obstructive sleep apnea    Thrombocytopenia ([facility])     Family History  Problem Relation Age of Onset   Coronary artery disease Other    Hypertension Mother    Diabetes Mother    Heart attack Mother    Diabetes Father    CVA Father    Diabetes Brother    Hypertension Brother    Colon cancer Neg Hx    Colon polyps Neg Hx     Past Surgical History:  Procedure Laterality Date   BIOPSY  05/04/2018   Procedure: BIOPSY;  Surgeon: Danie Binder, MD;  [facility];  Service: Endoscopy;;  gastric   CARDIAC CATHETERIZATION N/A 09/17/2015   Procedure: Left Heart Cath and Coronary Angiography;  Surgeon: [name], MD;  Location: [facility];  Service: Cardiovascular;  Laterality: N/A;   COLONOSCOPY N/A 05/04/2018   Procedure: COLONOSCOPY;  Surgeon: Danie Binder, MD;  [facility];  Service: Endoscopy;  Laterality: N/A;  11:00am   CORONARY ANGIOPLASTY WITH STENT PLACEMENT     left carpal tunnel     POLYPECTOMY  05/04/2018   Procedure: POLYPECTOMY;  Surgeon: Danie Binder, MD;  [facility];  Service: Endoscopy;;  ascending,rectal,gastric, duodenal,   ROTATOR CUFF REPAIR     WISDOM TOOTH EXTRACTION     Social History   Occupational History   Occupation: Full-time    Employer: [employer]  Tobacco Use   Smoking status: Former    Types: Cigarettes    Start date: 11/21/1977    Quit date: 08/04/2000    Years since  quitting: 22.3   Smokeless tobacco: Never   Tobacco comments:    Tobacco - no  Vaping Use   Vaping Use: Never used  Substance and Sexual Activity   Alcohol use: No    Alcohol/week: 0.0 standard drinks of alcohol    Comment: rare, maybe 1 beer once a month    Drug use: No   Sexual activity: Not on file

## 2022-11-28 ENCOUNTER — Ambulatory Visit (INDEPENDENT_AMBULATORY_CARE_PROVIDER_SITE_OTHER): Payer: No Typology Code available for payment source | Admitting: Orthopedic Surgery

## 2022-11-28 ENCOUNTER — Encounter: Payer: Self-pay | Admitting: Orthopedic Surgery

## 2022-11-28 DIAGNOSIS — G5601 Carpal tunnel syndrome, right upper limb: Secondary | ICD-10-CM

## 2022-11-28 NOTE — Progress Notes (Signed)
Post-Op Visit Note   Patient: [name]           Date of Birth: 1961/02/15           MRN: [id number] Visit Date: 11/28/2022 PCP: Pcp, No   Assessment & Plan:  Chief Complaint:  Chief Complaint  Patient presents with   Right Hand - Pain   Visit Diagnoses:  1. Carpal tunnel syndrome, right upper limb     Plan: Stefen is a patient who underwent carpal tunnel release 11/07/2022.  Slight concern about his incision last clinic visit.  Placed on some antibiotics.  Right now the incision looks very good.  No fluctuance that has epithelialized over with no redness or erythema.  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  Come back in 3 weeks so we can project his return to work date.  Took him several months to get over the left hand carpal tunnel release.  Follow-Up Instructions: No follow-ups on file.   Orders:  No orders of the defined types were placed in this encounter.  No orders of the defined types were placed in this encounter.   Imaging: No results found.  PMFS History: Patient Active Problem List   Diagnosis Date Noted   Encounter for screening colonoscopy 03/22/2018   Fatty liver 03/22/2018   GERD (gastroesophageal reflux disease) 03/22/2018   CAD S/P percutaneous coronary angioplasty 09/18/2015   Hyperlipidemia 09/18/2015   Thrombocytopenia ([place]) 09/18/2015   Diabetes mellitus type 2, diet-controlled ([place])    Ischemic cardiomyopathy    Ischemic chest pain ([place]) 09/17/2015   Exertional angina 09/17/2015   Type 2 diabetes mellitus ([place]) 01/21/2014   Preoperative cardiovascular examination 07/03/2013   Essential hypertension 01/29/2010   ANGINA, STABLE 09/01/2009   CORONARY ATHEROSCLEROSIS NATIVE CORONARY ARTERY 09/01/2009   DYSLIPIDEMIA 08/31/2009   Past Medical History:  Diagnosis Date   Coronary atherosclerosis of native coronary artery    a. remote LAD  stent. b. subsequent stents to RCA with restenosis. c. DES to RCA and Circ 06/2007 in setting of restenosis. d. DES to LAD & RCA in 01/2010. e. DES to Cx and LAD; total occlusion of RCA, LVEF 40-45%.   Diabetes mellitus type 2, diet-controlled ([place])    Essential hypertension    Hepatic steatosis    Hyperlipidemia    Ischemic cardiomyopathy    MI (myocardial infarction) ([place])    Obstructive sleep apnea    Thrombocytopenia ([facility])     Family History  Problem Relation Age of Onset   Coronary artery disease Other    Hypertension Mother    Diabetes Mother    Heart attack Mother    Diabetes Father    CVA Father    Diabetes Brother    Hypertension Brother    Colon cancer Neg Hx    Colon polyps Neg Hx     Past Surgical History:  Procedure Laterality Date   BIOPSY  05/04/2018   Procedure: BIOPSY;  Surgeon: Danie Binder, MD;  [facility];  Service: Endoscopy;;  gastric   CARDIAC CATHETERIZATION N/A 09/17/2015   Procedure: Left Heart Cath and Coronary Angiography;  Surgeon: [name], MD;  Location: [facility];  Service: Cardiovascular;  Laterality: N/A;   COLONOSCOPY N/A 05/04/2018   Procedure: COLONOSCOPY;  Surgeon: Danie Binder, MD;  [facility];  Service: Endoscopy;  Laterality: N/A;  11:00am   CORONARY ANGIOPLASTY WITH STENT PLACEMENT     left carpal tunnel     POLYPECTOMY  05/04/2018   Procedure: POLYPECTOMY;  Surgeon: Danie Binder, MD;  [facility];  Service: Endoscopy;;  ascending,rectal,gastric, duodenal,   ROTATOR CUFF REPAIR     WISDOM TOOTH EXTRACTION     Social History   Occupational History   Occupation: Full-time    Employer: [employer]  Tobacco Use   Smoking status: Former    Types: Cigarettes    Start date: 11/21/1977    Quit date: 08/04/2000    Years since quitting: 22.3   Smokeless tobacco: Never   Tobacco comments:    Tobacco - no  Vaping Use   Vaping Use: Never used  Substance and Sexual Activity    Alcohol use: No    Alcohol/week: 0.0 standard drinks of alcohol    Comment: rare, maybe 1 beer once a month    Drug use: No   Sexual activity: Not on file

## 2022-12-13 ENCOUNTER — Other Ambulatory Visit: Payer: Self-pay | Admitting: Surgical

## 2022-12-16 ENCOUNTER — Telehealth: Payer: Self-pay | Admitting: Orthopedic Surgery

## 2022-12-16 NOTE — Telephone Encounter (Signed)
I'd say postpone by a week unless he states his incision is having any problems.

## 2022-12-16 NOTE — Telephone Encounter (Signed)
Pt called stating he tested positive for covid yesterday with no symptoms. Pt is asking for a call back concerning his post op appt set for Monday 1/29. Please call pt at [phone]

## 2022-12-19 ENCOUNTER — Encounter: Payer: No Typology Code available for payment source | Admitting: Orthopedic Surgery

## 2022-12-19 NOTE — Telephone Encounter (Signed)
Rescheduled. He stated no issues with incision, just continued pain

## 2022-12-21 ENCOUNTER — Encounter: Payer: No Typology Code available for payment source | Admitting: Orthopedic Surgery

## 2022-12-26 ENCOUNTER — Telehealth: Payer: Self-pay | Admitting: Orthopedic Surgery

## 2022-12-26 ENCOUNTER — Ambulatory Visit (INDEPENDENT_AMBULATORY_CARE_PROVIDER_SITE_OTHER): Payer: No Typology Code available for payment source | Admitting: Orthopedic Surgery

## 2022-12-26 DIAGNOSIS — G5601 Carpal tunnel syndrome, right upper limb: Secondary | ICD-10-CM

## 2022-12-26 NOTE — Telephone Encounter (Signed)
Patient would like to try and get PT in [place] if possible being it would be an easier commute Marlou Sa talked with him about a PT today

## 2022-12-28 NOTE — Progress Notes (Unsigned)
Post-Op Visit Note   Patient: [name]           Date of Birth: Jan 10, 1961           MRN: [id number] Visit Date: 12/26/2022 PCP: Pcp, No   Assessment & Plan:  Chief Complaint:  Chief Complaint  Patient presents with   Right Wrist - Follow-up   Visit Diagnoses:  1. Carpal tunnel syndrome, right upper limb     Plan: [name] is a 62 year old patient who underwent carpal tunnel release December 18.  Still is having some soreness and weakness in the hand.  It has gotten slightly better.  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  Takes tramadol for pain.  Patient is diabetic.  Tried to do some hammering the other day but the force of the hammer through his incision was significant.  On examination today the incision is intact.  Does have mild pain at the base of the incision.  Abductor pollicis brevis strength intact.  Plan at this time is occupational therapy here 1-2 times a week plus a home exercise program for 2 weeks.  Out of work for 2 weeks.  2-week return prior to going back to work.  I do not think it is too uncommon that he may have some pain right in the midportion of the palm if he is doing any type of significant forceful work with instruments that lay right on top of the nerve.  That should improve.  Follow-Up Instructions: Return in about 2 weeks (around 01/09/2023).   Orders:  Orders Placed This Encounter  Procedures   Ambulatory referral to Occupational Therapy   No orders of the defined types were placed in this encounter.   Imaging: No results found.  PMFS History: Patient Active Problem List   Diagnosis Date Noted   Encounter for screening colonoscopy 03/22/2018   Fatty liver 03/22/2018   GERD (gastroesophageal reflux disease) 03/22/2018   CAD S/P percutaneous coronary angioplasty 09/18/2015   Hyperlipidemia 09/18/2015   Thrombocytopenia ([place]) 09/18/2015   Diabetes mellitus type 2, diet-controlled ([place])    Ischemic cardiomyopathy    Ischemic  chest pain ([place]) 09/17/2015   Exertional angina 09/17/2015   Type 2 diabetes mellitus ([place]) 01/21/2014   Preoperative cardiovascular examination 07/03/2013   Essential hypertension 01/29/2010   ANGINA, STABLE 09/01/2009   CORONARY ATHEROSCLEROSIS NATIVE CORONARY ARTERY 09/01/2009   DYSLIPIDEMIA 08/31/2009   Past Medical History:  Diagnosis Date   Coronary atherosclerosis of native coronary artery    a. remote LAD stent. b. subsequent stents to RCA with restenosis. c. DES to RCA and Circ 06/2007 in setting of restenosis. d. DES to LAD & RCA in 01/2010. e. DES to Cx and LAD; total occlusion of RCA, LVEF 40-45%.   Diabetes mellitus type 2, diet-controlled ([place])    Essential hypertension    Hepatic steatosis    Hyperlipidemia    Ischemic cardiomyopathy    MI (myocardial infarction) ([place])    Obstructive sleep apnea    Thrombocytopenia ([facility])     Family History  Problem Relation Age of Onset   Coronary artery disease Other    Hypertension Mother    Diabetes Mother    Heart attack Mother    Diabetes Father    CVA Father    Diabetes Brother    Hypertension Brother    Colon cancer Neg Hx    Colon polyps Neg Hx     Past Surgical History:  Procedure Laterality Date  BIOPSY  05/04/2018   Procedure: BIOPSY;  Surgeon: Danie Binder, MD;  [facility];  Service: Endoscopy;;  gastric   CARDIAC CATHETERIZATION N/A 09/17/2015   Procedure: Left Heart Cath and Coronary Angiography;  Surgeon: [name], MD;  Location: [facility];  Service: Cardiovascular;  Laterality: N/A;   COLONOSCOPY N/A 05/04/2018   Procedure: COLONOSCOPY;  Surgeon: Danie Binder, MD;  [facility];  Service: Endoscopy;  Laterality: N/A;  11:00am   CORONARY ANGIOPLASTY WITH STENT PLACEMENT     left carpal tunnel     POLYPECTOMY  05/04/2018   Procedure: POLYPECTOMY;  Surgeon: Danie Binder, MD;  [facility];  Service: Endoscopy;;  ascending,rectal,gastric, duodenal,    ROTATOR CUFF REPAIR     WISDOM TOOTH EXTRACTION     Social History   Occupational History   Occupation: Full-time    Employer: [employer]  Tobacco Use   Smoking status: Former    Types: Cigarettes    Start date: 11/21/1977    Quit date: 08/04/2000    Years since quitting: 22.4   Smokeless tobacco: Never   Tobacco comments:    Tobacco - no  Vaping Use   Vaping Use: Never used  Substance and Sexual Activity   Alcohol use: No    Alcohol/week: 0.0 standard drinks of alcohol    Comment: rare, maybe 1 beer once a month    Drug use: No   Sexual activity: Not on file

## 2022-12-29 ENCOUNTER — Encounter: Payer: Self-pay | Admitting: Orthopedic Surgery

## 2022-12-30 ENCOUNTER — Ambulatory Visit (HOSPITAL_COMMUNITY): Payer: No Typology Code available for payment source | Attending: Orthopedic Surgery | Admitting: Occupational Therapy

## 2022-12-30 DIAGNOSIS — M25631 Stiffness of right wrist, not elsewhere classified: Secondary | ICD-10-CM | POA: Diagnosis present

## 2022-12-30 DIAGNOSIS — G5601 Carpal tunnel syndrome, right upper limb: Secondary | ICD-10-CM | POA: Diagnosis not present

## 2022-12-30 DIAGNOSIS — M25531 Pain in right wrist: Secondary | ICD-10-CM | POA: Insufficient documentation

## 2022-12-30 DIAGNOSIS — R29898 Other symptoms and signs involving the musculoskeletal system: Secondary | ICD-10-CM | POA: Diagnosis present

## 2022-12-30 NOTE — Therapy (Signed)
OUTPATIENT OCCUPATIONAL THERAPY ORTHO EVALUATION  Patient Name: [name] MRN: [id number] DOB:07/10/1961, 62 y.o., male Today's Date: 12/30/2022  PCP: [facility] REFERRING PROVIDER: Josiah Lobo, MD  END OF SESSION:   Past Medical History:  Diagnosis Date   Coronary atherosclerosis of native coronary artery    a. remote LAD stent. b. subsequent stents to RCA with restenosis. c. DES to RCA and Circ 06/2007 in setting of restenosis. d. DES to LAD & RCA in 01/2010. e. DES to Cx and LAD; total occlusion of RCA, LVEF 40-45%.   Diabetes mellitus type 2, diet-controlled ([place])    Essential hypertension    Hepatic steatosis    Hyperlipidemia    Ischemic cardiomyopathy    MI (myocardial infarction) ([place])    Obstructive sleep apnea    Thrombocytopenia ([place])    Past Surgical History:  Procedure Laterality Date   BIOPSY  05/04/2018   Procedure: BIOPSY;  Surgeon: Danie Binder, MD;  [facility];  Service: Endoscopy;;  gastric   CARDIAC CATHETERIZATION N/A 09/17/2015   Procedure: Left Heart Cath and Coronary Angiography;  Surgeon: [name], MD;  Location: [facility];  Service: Cardiovascular;  Laterality: N/A;   COLONOSCOPY N/A 05/04/2018   Procedure: COLONOSCOPY;  Surgeon: Danie Binder, MD;  [facility];  Service: Endoscopy;  Laterality: N/A;  11:00am   CORONARY ANGIOPLASTY WITH STENT PLACEMENT     left carpal tunnel     POLYPECTOMY  05/04/2018   Procedure: POLYPECTOMY;  Surgeon: Danie Binder, MD;  [facility];  Service: Endoscopy;;  ascending,rectal,gastric, duodenal,   ROTATOR CUFF REPAIR     WISDOM TOOTH EXTRACTION     Patient Active Problem List   Diagnosis Date Noted   Encounter for screening colonoscopy 03/22/2018   Fatty liver 03/22/2018   GERD (gastroesophageal reflux disease) 03/22/2018   CAD S/P percutaneous coronary angioplasty 09/18/2015   Hyperlipidemia 09/18/2015   Thrombocytopenia ([place])  09/18/2015   Diabetes mellitus type 2, diet-controlled ([place])    Ischemic cardiomyopathy    Ischemic chest pain ([place]) 09/17/2015   Exertional angina 09/17/2015   Type 2 diabetes mellitus ([place]) 01/21/2014   Preoperative cardiovascular examination 07/03/2013   Essential hypertension 01/29/2010   ANGINA, STABLE 09/01/2009   CORONARY ATHEROSCLEROSIS NATIVE CORONARY ARTERY 09/01/2009   DYSLIPIDEMIA 08/31/2009    ONSET DATE: 11/08/23  REFERRING DIAG: R carpal tunnel release  THERAPY DIAG:  No diagnosis found.  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   PRECAUTIONS: None  WEIGHT BEARING RESTRICTIONS: Yes 5+lbs  PAIN:  Are you having pain? Yes: NPRS scale: 3/10 Pain location: Under the incision to the thumb Pain description: Sharp/Stabbing, bad ache Aggravating factors: lifting, hammering, going to bed Relieving factors: Tramadol  FALLS: Has patient fallen in last 6 months? No  LIVING ENVIRONMENT: Lives with: lives with their spouse Lives in: House/apartment  PLOF: Independent  PATIENT GOALS: Less pain  NEXT MD VISIT: 01/09/23  OBJECTIVE:   HAND DOMINANCE: Right  ADLs: Overall ADLs: Patient reports  difficulty with small object manipulation, severe pain with lifting and pushing objects during cooking and cleaning tasks. Unable to perform yard work or job duties due to pain and limitations with lifting and sustaining holds.   FUNCTIONAL OUTCOME MEASURES: Quick Dash: 43.18  UPPER EXTREMITY ROM:  Active ROM Right eval  Wrist flexion 44  Wrist extension 50  Wrist ulnar deviation 32  Wrist radial deviation 14  Wrist pronation WFL  Wrist supination 88  (Blank rows = not tested)  Active ROM Right eval  Thumb MCP (0-60) 55  Thumb IP (0-80) 55  Thumb  Opposition to Small Finger Able  Index MCP (0-90) 80  Index PIP (0-100) 85  Index DIP (0-70)  50  Long MCP (0-90) 80   Long PIP (0-100)  85  Long DIP (0-70)  50  Ring MCP (0-90)  75  Ring PIP (0-100)  100  Ring DIP (0-70)  50  Little MCP (0-90)  65  Little PIP (0-100)  85  Little DIP (0-70)  65  (Blank rows = not tested)   UPPER EXTREMITY MMT:     MMT Right eval  Wrist flexion 4+/5  Wrist extension 4/5  Wrist ulnar deviation 4/5  Wrist radial deviation 4-/5  Wrist pronation 4+/5  Wrist supination 4+/5  (Blank rows = not tested)  HAND FUNCTION: Grip strength: Right: 35 lbs; Left: 75 lbs, Lateral pinch: Right: 10 lbs, Left: 20 lbs, and 3 point pinch: Right: 11 lbs, Left: 23 lbs  COORDINATION: 9 Hole Peg test: Right: 37 sec; Left: 32 sec  SENSATION: WFL  EDEMA: Mild swelling in the heel of the palm.   OBSERVATIONS: ***   TODAY'S TREATMENT:                                                                                                                              DATE: ***    PATIENT EDUCATION: Education details: *** Person educated: {Person educated:25204} Education method: {Education Method:25205} Education comprehension: {Education Comprehension:25206}  HOME EXERCISE PROGRAM: ***  GOALS: Goals reviewed with patient? {yes/no:20286}  SHORT TERM GOALS: Target date: ***  *** Baseline: Goal status: {GOALSTATUS:25110}  2.  *** Baseline:  Goal status: {GOALSTATUS:25110}  3.  *** Baseline:  Goal status: {GOALSTATUS:25110}  4.  *** Baseline:  Goal status: {GOALSTATUS:25110}  5.  *** Baseline:  Goal status: {GOALSTATUS:25110}  6.  *** Baseline:  Goal status: {GOALSTATUS:25110}  LONG TERM GOALS: Target date: ***  *** Baseline:  Goal status: {GOALSTATUS:25110}  2.  *** Baseline:  Goal status: {GOALSTATUS:25110}  3.  *** Baseline:  Goal status: {GOALSTATUS:25110}  4.  *** Baseline:  Goal status: {GOALSTATUS:25110}  5.   *** Baseline:  Goal status: {GOALSTATUS:25110}  6.  *** Baseline:  Goal status: {GOALSTATUS:25110}  ASSESSMENT:  CLINICAL IMPRESSION: Patient is a *** y.o. *** who was seen today for occupational therapy evaluation for ***.   PERFORMANCE DEFICITS: in functional skills including {OT physical skills:25468}, cognitive skills including {OT cognitive skills:25469}, and psychosocial skills including {OT psychosocial skills:25470}.   IMPAIRMENTS: are limiting patient from {OT performance deficits:25471}.   COMORBIDITIES: {Comorbidities:25485} that affects occupational performance. Patient will benefit from skilled OT to address above impairments and improve overall function.  MODIFICATION OR ASSISTANCE TO  COMPLETE EVALUATION: {OT modification:25474}  OT OCCUPATIONAL PROFILE AND HISTORY: {OT PROFILE AND HISTORY:25484}  CLINICAL DECISION MAKING: {OT CDM:25475}  REHAB POTENTIAL: {rehabpotential:25112}  EVALUATION COMPLEXITY: {Evaluation complexity:25115}      PLAN:  OT FREQUENCY: {rehab frequency:25116}  OT DURATION: {rehab duration:25117}  PLANNED INTERVENTIONS: {OT Interventions:25467}  RECOMMENDED OTHER SERVICES: ***  CONSULTED AND AGREED WITH PLAN OF CARE: [id number]  PLAN FOR NEXT SESSION: ***   Charnetta Wulff Isabelle Course, OT 12/30/2022, 8:20 AM

## 2022-12-30 NOTE — Patient Instructions (Signed)
AROM Exercises   1) Wrist Flexion  Start with wrist at edge of table, palm facing up. With wrist hanging slightly off table, curl wrist upward, and back down.      2) Wrist Extension  Start with wrist at edge of table, palm facing down. With wrist slightly off the edge of the table, curl wrist up and back down.      3) Radial Deviations  Start with forearm flat against a table, wrist hanging slightly off the edge, and palm facing the wall. Bending at the wrist only, and keeping palm facing the wall, bend wrist so fist is pointing towards the floor, back up to start position, and up towards the ceiling. Return to start.        4) WRIST PRONATION  Turn your forearm towards palm face down.  Keep your elbow bent and by the side of your  Body.      5) WRIST SUPINATION  Turn your forearm towards palm face up.  Keep your elbow bent and by the side of your  Body.      *Complete exercises ______ times each, _______ times per day*      Strengthening Exercises  1) WRIST EXTENSION CURLS - TABLE  Hold a small free weight, rest your forearm on a table and bend your wrist up and down with your palm face down as shown.      2) WRIST FLEXION CURLS - TABLE  Hold a small free weight, rest your forearm on a table and bend your wrist up and down with your palm face up as shown.     3) FREE WEIGHT RADIAL/ULNAR DEVIATION - TABLE  Hold a small free weight, rest your forearm on a table and bend your wrist up and down with your palm facing towards the side as shown.     4) Pronation  Forearm supported on table with wrist in neutral position. Using a weight, roll wrist so that palm faces downward. Hold for 2 seconds and return to starting position.     5) Supination  Forearm supported on table with wrist in neutral position. Using a weight, roll wrist so that palm is now facing upward. Hold for 2 seconds and return to starting position.      *Complete  exercises using ____ pound weight, ____times each, ____times per day*  

## 2023-01-01 ENCOUNTER — Encounter (HOSPITAL_COMMUNITY): Payer: Self-pay | Admitting: Occupational Therapy

## 2023-01-02 ENCOUNTER — Other Ambulatory Visit: Payer: Self-pay | Admitting: Cardiology

## 2023-01-02 ENCOUNTER — Other Ambulatory Visit: Payer: Self-pay | Admitting: Surgical

## 2023-01-04 ENCOUNTER — Encounter (HOSPITAL_COMMUNITY): Payer: Self-pay | Admitting: Occupational Therapy

## 2023-01-04 ENCOUNTER — Ambulatory Visit (HOSPITAL_COMMUNITY): Payer: No Typology Code available for payment source | Admitting: Occupational Therapy

## 2023-01-04 DIAGNOSIS — M25531 Pain in right wrist: Secondary | ICD-10-CM | POA: Diagnosis not present

## 2023-01-04 DIAGNOSIS — M25631 Stiffness of right wrist, not elsewhere classified: Secondary | ICD-10-CM

## 2023-01-04 DIAGNOSIS — R29898 Other symptoms and signs involving the musculoskeletal system: Secondary | ICD-10-CM

## 2023-01-04 NOTE — Therapy (Signed)
OUTPATIENT OCCUPATIONAL THERAPY ORTHO EVALUATION  Patient Name: [name] MRN: [id number] DOB:1961-05-23, 62 y.o., male Today's Date: 01/04/2023  PCP: [place] PROVIDER: Josiah Lobo, MD   OT End of Session - 01/04/23 1439     Visit Number 2    Number of Visits 9    Date for OT Re-Evaluation 02/03/23    Authorization Type [employer]    OT Start Time 1435    OT Stop Time 1515    OT Time Calculation (min) 40 min    Activity Tolerance Patient tolerated treatment well    Behavior During Therapy WFL for tasks assessed/performed           END OF SESSION:  OT End of Session - 01/04/23 1439     Visit Number 2    Number of Visits 9    Date for OT Re-Evaluation 02/03/23    Authorization Type [employer]    OT Start Time 1435    OT Stop Time 1515    OT Time Calculation (min) 40 min    Activity Tolerance Patient tolerated treatment well    Behavior During Therapy WFL for tasks assessed/performed             Past Medical History:  Diagnosis Date   Coronary atherosclerosis of native coronary artery    a. remote LAD stent. b. subsequent stents to RCA with restenosis. c. DES to RCA and Circ 06/2007 in setting of restenosis. d. DES to LAD & RCA in 01/2010. e. DES to Cx and LAD; total occlusion of RCA, LVEF 40-45%.   Diabetes mellitus type 2, diet-controlled ([place])    Essential hypertension    Hepatic steatosis    Hyperlipidemia    Ischemic cardiomyopathy    MI (myocardial infarction) ([place])    Obstructive sleep apnea    Thrombocytopenia ([place])    Past Surgical History:  Procedure Laterality Date   BIOPSY  05/04/2018   Procedure: BIOPSY;  Surgeon: Danie Binder, MD;  [facility];  Service: Endoscopy;;  gastric   CARDIAC CATHETERIZATION N/A 09/17/2015   Procedure: Left Heart Cath and Coronary Angiography;  Surgeon: [name], MD;  Location: [facility];  Service: Cardiovascular;  Laterality: N/A;   COLONOSCOPY N/A 05/04/2018    Procedure: COLONOSCOPY;  Surgeon: Danie Binder, MD;  [facility];  Service: Endoscopy;  Laterality: N/A;  11:00am   CORONARY ANGIOPLASTY WITH STENT PLACEMENT     left carpal tunnel     POLYPECTOMY  05/04/2018   Procedure: POLYPECTOMY;  Surgeon: Danie Binder, MD;  [facility];  Service: Endoscopy;;  ascending,rectal,gastric, duodenal,   ROTATOR CUFF REPAIR     WISDOM TOOTH EXTRACTION     Patient Active Problem List   Diagnosis Date Noted   Encounter for screening colonoscopy 03/22/2018   Fatty liver 03/22/2018   GERD (gastroesophageal reflux disease) 03/22/2018   CAD S/P percutaneous coronary angioplasty 09/18/2015   Hyperlipidemia 09/18/2015   Thrombocytopenia ([place]) 09/18/2015   Diabetes mellitus type 2, diet-controlled ([place])    Ischemic cardiomyopathy    Ischemic chest pain ([place]) 09/17/2015   Exertional angina 09/17/2015   Type 2 diabetes mellitus ([place]) 01/21/2014   Preoperative cardiovascular examination 07/03/2013   Essential hypertension 01/29/2010   ANGINA, STABLE 09/01/2009   CORONARY ATHEROSCLEROSIS NATIVE CORONARY ARTERY 09/01/2009   DYSLIPIDEMIA 08/31/2009    ONSET DATE: 11/08/23  REFERRING DIAG: R carpal tunnel release  THERAPY DIAG:  Pain in right wrist  Stiffness of right wrist, not elsewhere classified  Other symptoms and signs involving the musculoskeletal system  Rationale for Evaluation and Treatment: Rehabilitation  SUBJECTIVE:   SUBJECTIVE STATEMENT: "The doc doesn't think I should be having this much pain still." Pt accompanied by: self  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   PRECAUTIONS: None  WEIGHT BEARING RESTRICTIONS: Yes 5+lbs  PAIN:  Are you having pain? Yes: NPRS scale: 3/10 Pain location: Under the incision to the thumb Pain description: Sharp/Stabbing, bad ache Aggravating factors: lifting, hammering, going to bed Relieving factors: Tramadol  FALLS: Has patient fallen in last 6  months? No  LIVING ENVIRONMENT: Lives with: lives with their spouse Lives in: House/apartment  PLOF: Independent  PATIENT GOALS: Less pain  NEXT MD VISIT: 01/09/23  OBJECTIVE:   HAND DOMINANCE: Right  ADLs: Overall ADLs: Patient reports  difficulty with small object manipulation, severe pain with lifting and pushing objects during cooking and cleaning tasks. Unable to perform yard work or job duties due to pain and limitations with lifting and sustaining holds.   FUNCTIONAL OUTCOME MEASURES: Quick Dash: 43.18  UPPER EXTREMITY ROM:     Active ROM Right eval  Wrist flexion 44  Wrist extension 50  Wrist ulnar deviation 32  Wrist radial deviation 14  Wrist pronation WFL  Wrist supination 88  (Blank rows = not tested)  Active ROM Right eval  Thumb MCP (0-60) 55  Thumb IP (0-80) 55  Thumb Opposition to Small Finger Able  Index MCP (0-90) 80  Index PIP (0-100) 85  Index DIP (0-70)  50  Long MCP (0-90) 80   Long PIP (0-100)  85  Long DIP (0-70)  50  Ring MCP (0-90)  75  Ring PIP (0-100)  100  Ring DIP (0-70)  50  Little MCP (0-90)  65  Little PIP (0-100)  85  Little DIP (0-70)  65  (Blank rows = not tested)   UPPER EXTREMITY MMT:     MMT Right eval  Wrist flexion 4+/5  Wrist extension 4/5  Wrist ulnar deviation 4/5  Wrist radial deviation 4-/5  Wrist pronation 4+/5  Wrist supination 4+/5  (Blank rows = not tested)  HAND FUNCTION: Grip strength: Right: 35 lbs; Left: 75 lbs, Lateral pinch: Right: 10 lbs, Left: 20 lbs, and 3 point pinch: Right: 11 lbs, Left: 23 lbs  COORDINATION: 9 Hole Peg test: Right: 37 sec; Left: 32 sec  SENSATION: WFL  EDEMA: Mild swelling in the heel of the palm.   OBSERVATIONS: Stiffness and muscle tension along the flexors and extensors of the R wrists   TODAY'S TREATMENT:                                                                                                                              DATE:   01/04/23 -Wrist  ROM: flexion/extension, ulnar/radial deviation, supination/pronation, x10 -Wrist Strengthening: 3lb dumbbells, flexion/extension, ulnar/radial deviation, supination/pronation, x10 -Gripper: 35lb picking up and  stacking 6 cubes, squeeze x10, 38lb picking up and stacking 6 cubes, squeeze x10 -Theraputty: red putty, roll into ball, flatten into pancake, using PVC pipe to "cut" out cookies using flexion/extension of the wrist x15 -Manual Therapy: myofascial release and trigger point massage    PATIENT EDUCATION: Education details: Theraputty Person educated: Patient Education method: Explanation and Demonstration Education comprehension: verbalized understanding and returned demonstration  HOME EXERCISE PROGRAM: 2/9: Wrist A/ROM and strengthening 2/14: Theraputty  GOALS: Goals reviewed with patient? Yes  SHORT TERM GOALS: Target date: 02/03/23  Pt will be provided with and educated on HEP to improve pain and mobility in R hand required for ADL completion.   Goal status: IN PROGRESS  2.  Pt will decrease pain in R hand to 2/10 or less in order to sleep for 3+ consecutive hours without waking due to pain.  Goal status: IN PROGRESS  3.  Pt will increase dominant right grip strength to be equal to or greater than left grip strength to improve ability to grasp and hold pots and pans during meal preparation.  Goal status: IN PROGRESS  4.  Pt will decrease RUE fascial restrictions and swelling to minimal amounts or less to improve mobility required typing and writing.  Goal status: IN PROGRESS  5.  Pt will increase ROM in right wrist and digits to [facility] to improve ability to form a full grasp required for doing ADL's.  Goal status: IN PROGRESS   ASSESSMENT:  CLINICAL IMPRESSION: Pt presented this session with improving pain and ROM. 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 Overall his grip strength is improving as well, however he has difficulty sustaining holds due to pain and soreness going through his wrist. OT providing verbal and tactile cuing throughout the session for positioning and technique.   PERFORMANCE DEFICITS: in functional skills including ADLs, IADLs, sensation, edema, ROM, strength, pain, fascial restrictions, Fine motor control, body mechanics, and UE functional use.   PLAN:  OT FREQUENCY: 1-2x/week  OT DURATION: 4 weeks  PLANNED INTERVENTIONS: self care/ADL training, therapeutic exercise, therapeutic activity, manual therapy, passive range of motion, functional mobility training, electrical stimulation, ultrasound, paraffin, moist heat, cryotherapy, patient/family education, coping strategies training, and DME and/or AE instructions  RECOMMENDED OTHER SERVICES: N/A  CONSULTED AND AGREED WITH PLAN OF CARE: Patient  PLAN FOR NEXT SESSION: Manual Therapy, P/ROM, A/ROM, Fine Motor Skills, Coordination, grip and pinch strength   Paulita Fujita, OTR/L [facility] [place], [place] 01/04/2023, 2:40 PM

## 2023-01-04 NOTE — Patient Instructions (Signed)
Home Exercises Program Theraputty Exercises  Do the following exercises 2-3 times a day using your affected hand.  1. Roll putty into a ball.  2. Make into a pancake.  3. Roll putty into a roll.  4. Pinch along log with first finger and thumb.   5. Make into a ball.  6. Roll it back into a log.   7. Pinch using thumb and side of first finger.  8. Roll into a ball, then flatten into a pancake.  9. Using your fingers, make putty into a mountain.  10. Roll putty back into a ball and squeeze gently for 2-3 minutes.

## 2023-01-06 ENCOUNTER — Encounter (HOSPITAL_COMMUNITY): Payer: Self-pay | Admitting: Occupational Therapy

## 2023-01-06 ENCOUNTER — Ambulatory Visit (HOSPITAL_COMMUNITY): Payer: No Typology Code available for payment source | Admitting: Occupational Therapy

## 2023-01-06 DIAGNOSIS — M25531 Pain in right wrist: Secondary | ICD-10-CM

## 2023-01-06 DIAGNOSIS — M25631 Stiffness of right wrist, not elsewhere classified: Secondary | ICD-10-CM

## 2023-01-06 DIAGNOSIS — R29898 Other symptoms and signs involving the musculoskeletal system: Secondary | ICD-10-CM

## 2023-01-06 NOTE — Therapy (Signed)
OUTPATIENT OCCUPATIONAL THERAPY ORTHO EVALUATION  Patient Name: [name] MRN: [id number] DOB:[birth year], 62 y.o., male Today's Date: 01/06/2023  PCP: [place] PROVIDER: Josiah Lobo, MD   OT End of Session - 01/06/23 1349     Visit Number 3    Number of Visits 9    Date for OT Re-Evaluation 02/03/23    Authorization Type Aetna    OT Start Time 1300    OT Stop Time 1345    OT Time Calculation (min) 45 min    Activity Tolerance Patient tolerated treatment well    Behavior During Therapy WFL for tasks assessed/performed           END OF SESSION:  OT End of Session - 01/06/23 1349     Visit Number 3    Number of Visits 9    Date for OT Re-Evaluation 02/03/23    Authorization Type Aet[employer]    OT Start Time 1300    OT Stop Time 1345    OT Time Calculation (min) 45 min    Activity Tolerance Patient tolerated treatment well    Behavior During Therapy WFL for tasks assessed/performed             Past Medical History:  Diagnosis Date   Coronary atherosclerosis of native coronary artery    a. remote LAD stent. b. subsequent stents to RCA with restenosis. c. DES to RCA and Circ 06/2007 in setting of restenosis. d. DES to LAD & RCA in 01/2010. e. DES to Cx and LAD; total occlusion of RCA, LVEF 40-45%.   Diabetes mellitus type 2, diet-controlled ([place])    Essential hypertension    Hepatic steatosis    Hyperlipidemia    Ischemic cardiomyopathy    MI (myocardial infarction) ([place])    Obstructive sleep apnea    Thrombocytopenia ([place])    Past Surgical History:  Procedure Laterality Date   BIOPSY  05/04/2018   Procedure: BIOPSY;  Surgeon: Danie Binder, MD;  [facility];  Service: Endoscopy;;  gastric   CARDIAC CATHETERIZATION N/A 09/17/2015   Procedure: Left Heart Cath and Coronary Angiography;  Surgeon: [name], MD;  Location: [facility];  Service: Cardiovascular;  Laterality: N/A;   COLONOSCOPY N/A 05/04/2018    Procedure: COLONOSCOPY;  Surgeon: Danie Binder, MD;  [facility];  Service: Endoscopy;  Laterality: N/A;  11:00am   CORONARY ANGIOPLASTY WITH STENT PLACEMENT     left carpal tunnel     POLYPECTOMY  05/04/2018   Procedure: POLYPECTOMY;  Surgeon: Danie Binder, MD;  [facility];  Service: Endoscopy;;  ascending,rectal,gastric, duodenal,   ROTATOR CUFF REPAIR     WISDOM TOOTH EXTRACTION     Patient Active Problem List   Diagnosis Date Noted   Encounter for screening colonoscopy 03/22/2018   Fatty liver 03/22/2018   GERD (gastroesophageal reflux disease) 03/22/2018   CAD S/P percutaneous coronary angioplasty 09/18/2015   Hyperlipidemia 09/18/2015   Thrombocytopenia ([place]) 09/18/2015   Diabetes mellitus type 2, diet-controlled ([place])    Ischemic cardiomyopathy    Ischemic chest pain ([place]) 09/17/2015   Exertional angina 09/17/2015   Type 2 diabetes mellitus ([place]) 01/21/2014   Preoperative cardiovascular examination 07/03/2013   Essential hypertension 01/29/2010   ANGINA, STABLE 09/01/2009   CORONARY ATHEROSCLEROSIS NATIVE CORONARY ARTERY 09/01/2009   DYSLIPIDEMIA 08/31/2009    ONSET DATE: 11/08/23  REFERRING DIAG: R carpal tunnel release  THERAPY DIAG:  Pain in right wrist  Stiffness of right wrist, not elsewhere classified  Other symptoms and signs involving the musculoskeletal system  Rationale for Evaluation and Treatment: Rehabilitation  SUBJECTIVE:   SUBJECTIVE STATEMENT: "I have an appointment Monday and I hope they release me for work." Pt accompanied by: self  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   PRECAUTIONS: None  WEIGHT BEARING RESTRICTIONS: Yes 5+lbs  PAIN:  Are you having pain? Yes: NPRS scale: 3/10 Pain location: Under the incision to the thumb Pain description: Sharp/Stabbing, bad ache Aggravating factors: lifting, hammering, going to bed Relieving factors: Tramadol  FALLS: Has patient fallen in last 6  months? No  LIVING ENVIRONMENT: Lives with: lives with their spouse Lives in: House/apartment  PLOF: Independent  PATIENT GOALS: Less pain  NEXT MD VISIT: 01/09/23  OBJECTIVE:   HAND DOMINANCE: Right  ADLs: Overall ADLs: Patient reports  difficulty with small object manipulation, severe pain with lifting and pushing objects during cooking and cleaning tasks. Unable to perform yard work or job duties due to pain and limitations with lifting and sustaining holds.   FUNCTIONAL OUTCOME MEASURES: Quick Dash: 43.18  UPPER EXTREMITY ROM:     Active ROM Right eval  Wrist flexion 44  Wrist extension 50  Wrist ulnar deviation 32  Wrist radial deviation 14  Wrist pronation WFL  Wrist supination 88  (Blank rows = not tested)  Active ROM Right eval  Thumb MCP (0-60) 55  Thumb IP (0-80) 55  Thumb Opposition to Small Finger Able  Index MCP (0-90) 80  Index PIP (0-100) 85  Index DIP (0-70)  50  Long MCP (0-90) 80   Long PIP (0-100)  85  Long DIP (0-70)  50  Ring MCP (0-90)  75  Ring PIP (0-100)  100  Ring DIP (0-70)  50  Little MCP (0-90)  65  Little PIP (0-100)  85  Little DIP (0-70)  65  (Blank rows = not tested)   UPPER EXTREMITY MMT:     MMT Right eval  Wrist flexion 4+/5  Wrist extension 4/5  Wrist ulnar deviation 4/5  Wrist radial deviation 4-/5  Wrist pronation 4+/5  Wrist supination 4+/5  (Blank rows = not tested)  HAND FUNCTION: Grip strength: Right: 35 lbs; Left: 75 lbs, Lateral pinch: Right: 10 lbs, Left: 20 lbs, and 3 point pinch: Right: 11 lbs, Left: 23 lbs  COORDINATION: 9 Hole Peg test: Right: 37 sec; Left: 32 sec  SENSATION: WFL  EDEMA: Mild swelling in the heel of the palm.   OBSERVATIONS: Stiffness and muscle tension along the flexors and extensors of the R wrists   TODAY'S TREATMENT:                                                                                                                              DATE:    01/06/2023 -Theraputty: red putty, roll into ball, flatten into pancake, tip pinch all the way down "log" -Manual Therapy: myofascial release  and trigger point massage - Fine motor: screwing and unscrewing nuts onto bolts with tip pinch grasp, using clip to pinch and pick up foam blocks  -Wrist ROM: flexion/extension, ulnar/radial deviation, supination/pronation 10x each way  - Parrafin: R hand dipped in paraffin wax and wrapped in towel + heat pack for 8 minutes    01/04/23 -Wrist ROM: flexion/extension, ulnar/radial deviation, supination/pronation, x10 -Wrist Strengthening: 3lb dumbbells, flexion/extension, ulnar/radial deviation, supination/pronation, x10 -Gripper: 35lb picking up and stacking 6 cubes, squeeze x10, 38lb picking up and stacking 6 cubes, squeeze x10 -Theraputty: red putty, roll into ball, flatten into pancake, using PVC pipe to "cut" out cookies using flexion/extension of the wrist x15 -Manual Therapy: myofascial release and trigger point massage    PATIENT EDUCATION: Education details: Theraputty Person educated: Patient Education method: Explanation and Demonstration Education comprehension: verbalized understanding and returned demonstration  HOME EXERCISE PROGRAM: 2/9: Wrist A/ROM and strengthening 2/14: Theraputty  GOALS: Goals reviewed with patient? Yes  SHORT TERM GOALS: Target date: 02/03/23  Pt will be provided with and educated on HEP to improve pain and mobility in R hand required for ADL completion.   Goal status: IN PROGRESS  2.  Pt will decrease pain in R hand to 2/10 or less in order to sleep for 3+ consecutive hours without waking due to pain.  Goal status: IN PROGRESS  3.  Pt will increase dominant right grip strength to be equal to or greater than left grip strength to improve ability to grasp and hold pots and pans during meal preparation.  Goal status: IN PROGRESS  4.  Pt will decrease RUE fascial restrictions and swelling to minimal  amounts or less to improve mobility required typing and writing.  Goal status: IN PROGRESS  5.  Pt will increase ROM in right wrist and digits to [facility] to improve ability to form a full grasp required for doing ADL's.  Goal status: IN PROGRESS   ASSESSMENT:  CLINICAL IMPRESSION: Pt reports 3/10 pain that is well controlled at home. He stated that has been completing HEP and although it has been challenging (theraputty and dumb bell ROM) it has been going well. He reports that after previous session, his pain improved due to myofascial release. Added fine motor tasks to target pinch strength- screwing nuts onto bolts and squeezing clip to pick up foam blocks. Pt demonstrating WFL wrist ROM. Ended session with paraffin and heat to decrease pain.   PERFORMANCE DEFICITS: in functional skills including ADLs, IADLs, sensation, edema, ROM, strength, pain, fascial restrictions, Fine motor control, body mechanics, and UE functional use.   PLAN:  OT FREQUENCY: 1-2x/week  OT DURATION: 4 weeks  PLANNED INTERVENTIONS: self care/ADL training, therapeutic exercise, therapeutic activity, manual therapy, passive range of motion, functional mobility training, electrical stimulation, ultrasound, paraffin, moist heat, cryotherapy, patient/family education, coping strategies training, and DME and/or AE instructions  RECOMMENDED OTHER SERVICES: N/A  CONSULTED AND AGREED WITH PLAN OF CARE: Patient  PLAN FOR NEXT SESSION: Manual Therapy, P/ROM, A/ROM, Fine Motor Skills, Coordination, grip and pinch strength   [facility] [phone] 01/06/2023, 1:50 PM

## 2023-01-09 ENCOUNTER — Ambulatory Visit: Payer: No Typology Code available for payment source | Admitting: Orthopedic Surgery

## 2023-01-10 ENCOUNTER — Ambulatory Visit (HOSPITAL_COMMUNITY): Payer: No Typology Code available for payment source | Admitting: Occupational Therapy

## 2023-01-10 ENCOUNTER — Encounter (HOSPITAL_COMMUNITY): Payer: Self-pay | Admitting: Occupational Therapy

## 2023-01-10 DIAGNOSIS — R29898 Other symptoms and signs involving the musculoskeletal system: Secondary | ICD-10-CM

## 2023-01-10 DIAGNOSIS — M25631 Stiffness of right wrist, not elsewhere classified: Secondary | ICD-10-CM

## 2023-01-10 DIAGNOSIS — M25531 Pain in right wrist: Secondary | ICD-10-CM | POA: Diagnosis not present

## 2023-01-10 NOTE — Therapy (Signed)
OUTPATIENT OCCUPATIONAL THERAPY ORTHO TREATMENT NOTE  Patient Name: [name] MRN: [id number] DOB:08-Feb-1961, 62 y.o., male Today's Date: 01/10/2023  PCP: [facility] REFERRING PROVIDER: Josiah Lobo, MD  END OF SESSION:  OT End of Session - 01/10/23 0858     Visit Number 4    Number of Visits 9    Date for OT Re-Evaluation 02/03/23    Authorization Type [employer]    OT Start Time 0900    OT Stop Time 0940    OT Time Calculation (min) 40 min    Activity Tolerance Patient tolerated treatment well    Behavior During Therapy WFL for tasks assessed/performed             Past Medical History:  Diagnosis Date   Coronary atherosclerosis of native coronary artery    a. remote LAD stent. b. subsequent stents to RCA with restenosis. c. DES to RCA and Circ 06/2007 in setting of restenosis. d. DES to LAD & RCA in 01/2010. e. DES to Cx and LAD; total occlusion of RCA, LVEF 40-45%.   Diabetes mellitus type 2, diet-controlled ([place])    Essential hypertension    Hepatic steatosis    Hyperlipidemia    Ischemic cardiomyopathy    MI (myocardial infarction) ([place])    Obstructive sleep apnea    Thrombocytopenia ([place])    Past Surgical History:  Procedure Laterality Date   BIOPSY  05/04/2018   Procedure: BIOPSY;  Surgeon: Danie Binder, MD;  [facility];  Service: Endoscopy;;  gastric   CARDIAC CATHETERIZATION N/A 09/17/2015   Procedure: Left Heart Cath and Coronary Angiography;  Surgeon: [name], MD;  Location: [place] [facility];  Service: Cardiovascular;  Laterality: N/A;   COLONOSCOPY N/A 05/04/2018   Procedure: COLONOSCOPY;  Surgeon: Danie Binder, MD;  [facility];  Service: Endoscopy;  Laterality: N/A;  11:00am   CORONARY ANGIOPLASTY WITH STENT PLACEMENT     left carpal tunnel     POLYPECTOMY  05/04/2018   Procedure: POLYPECTOMY;  Surgeon: Danie Binder, MD;  [facility];  Service: Endoscopy;;  ascending,rectal,gastric,  duodenal,   ROTATOR CUFF REPAIR     WISDOM TOOTH EXTRACTION     Patient Active Problem List   Diagnosis Date Noted   Encounter for screening colonoscopy 03/22/2018   Fatty liver 03/22/2018   GERD (gastroesophageal reflux disease) 03/22/2018   CAD S/P percutaneous coronary angioplasty 09/18/2015   Hyperlipidemia 09/18/2015   Thrombocytopenia ([place]) 09/18/2015   Diabetes mellitus type 2, diet-controlled ([place])    Ischemic cardiomyopathy    Ischemic chest pain ([place]) 09/17/2015   Exertional angina 09/17/2015   Type 2 diabetes mellitus ([place]) 01/21/2014   Preoperative cardiovascular examination 07/03/2013   Essential hypertension 01/29/2010   ANGINA, STABLE 09/01/2009   CORONARY ATHEROSCLEROSIS NATIVE CORONARY ARTERY 09/01/2009   DYSLIPIDEMIA 08/31/2009    ONSET DATE: 11/08/23  REFERRING DIAG: R carpal tunnel release  THERAPY DIAG:  Pain in right wrist  Stiffness of right wrist, not elsewhere classified  Other symptoms and signs involving the musculoskeletal system  Rationale for Evaluation and Treatment: Rehabilitation  SUBJECTIVE:   SUBJECTIVE STATEMENT: "I've been sick and not able to do much." Pt accompanied by: self  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   PRECAUTIONS: None  WEIGHT BEARING RESTRICTIONS: Yes 5+lbs  PAIN:  Are you having pain? Yes: NPRS scale: 3/10 Pain location: Under the incision to the thumb Pain description: Sharp/Stabbing, bad ache Aggravating factors: lifting,  hammering, going to bed Relieving factors: Tramadol  FALLS: Has patient fallen in last 6 months? No  PATIENT GOALS: Less pain  NEXT MD VISIT: 01/09/23  OBJECTIVE:   HAND DOMINANCE: Right  ADLs: Overall ADLs: Patient reports  difficulty with small object manipulation, severe pain with lifting and pushing objects during cooking and cleaning tasks. Unable to perform yard work or job duties due to pain and limitations with lifting and sustaining holds.    FUNCTIONAL OUTCOME MEASURES: Quick Dash: 43.18  UPPER EXTREMITY ROM:     Active ROM Right eval  Wrist flexion 44  Wrist extension 50  Wrist ulnar deviation 32  Wrist radial deviation 14  Wrist pronation WFL  Wrist supination 88  (Blank rows = not tested)  Active ROM Right eval  Thumb MCP (0-60) 55  Thumb IP (0-80) 55  Thumb Opposition to Small Finger Able  Index MCP (0-90) 80  Index PIP (0-100) 85  Index DIP (0-70)  50  Long MCP (0-90) 80   Long PIP (0-100)  85  Long DIP (0-70)  50  Ring MCP (0-90)  75  Ring PIP (0-100)  100  Ring DIP (0-70)  50  Little MCP (0-90)  65  Little PIP (0-100)  85  Little DIP (0-70)  65  (Blank rows = not tested)   UPPER EXTREMITY MMT:     MMT Right eval  Wrist flexion 4+/5  Wrist extension 4/5  Wrist ulnar deviation 4/5  Wrist radial deviation 4-/5  Wrist pronation 4+/5  Wrist supination 4+/5  (Blank rows = not tested)  HAND FUNCTION: Grip strength: Right: 35 lbs; Left: 75 lbs, Lateral pinch: Right: 10 lbs, Left: 20 lbs, and 3 point pinch: Right: 11 lbs, Left: 23 lbs  COORDINATION: 9 Hole Peg test: Right: 37 sec; Left: 32 sec  SENSATION: WFL  EDEMA: Mild swelling in the heel of the palm.   OBSERVATIONS: Stiffness and muscle tension along the flexors and extensors of the R wrists   TODAY'S TREATMENT:                                                                                                                              DATE:   01/10/23 -Wrist ROM: flexion/extension, ulnar/radial deviation, supination/pronation, x10 -Strengthening: 2lb dumbbell, flexion/extension, ulnar/radial deviation, supination/pronation, x10 -Gripper: 45lb full squeeze x10, 38lbs picking up 5 medium beads -Cards: holding entire deck, using thumb to flick 1 card down at a time, then flip each card over as he's placing it down, x20, shuffle full deck x5 -Coins: flipping coins over x10, holding 10 or 15 coins and placing them in the piggy bank 1  at a time.  -Manual Therapy: myofascial release and trigger point massage along the flexors, palmar aspect of hand and thumb, using gua sha  01/06/2023 -Theraputty: red putty, roll into ball, flatten into pancake, tip pinch all the way down "log" -Manual Therapy: myofascial release and trigger point massage - Fine  motor: screwing and unscrewing nuts onto bolts with tip pinch grasp, using clip to pinch and pick up foam blocks  -Wrist ROM: flexion/extension, ulnar/radial deviation, supination/pronation 10x each way  - Parrafin: R hand dipped in paraffin wax and wrapped in towel + heat pack for 8 minutes   01/04/23 -Wrist ROM: flexion/extension, ulnar/radial deviation, supination/pronation, x10 -Wrist Strengthening: 3lb dumbbells, flexion/extension, ulnar/radial deviation, supination/pronation, x10 -Gripper: 35lb picking up and stacking 6 cubes, squeeze x10, 38lb picking up and stacking 6 cubes, squeeze x10 -Theraputty: red putty, roll into ball, flatten into pancake, using PVC pipe to "cut" out cookies using flexion/extension of the wrist x15 -Manual Therapy: myofascial release and trigger point massage    PATIENT EDUCATION: Education details: Coin and Card tasks Person educated: Patient Education method: Explanation and Demonstration Education comprehension: verbalized understanding and returned demonstration  HOME EXERCISE PROGRAM: 2/9: Wrist A/ROM and strengthening 2/14: Theraputty 2/20: Coin and Card tasks  GOALS: Goals reviewed with patient? Yes  SHORT TERM GOALS: Target date: 02/03/23  Pt will be provided with and educated on HEP to improve pain and mobility in R hand required for ADL completion.   Goal status: IN PROGRESS  2.  Pt will decrease pain in R hand to 2/10 or less in order to sleep for 3+ consecutive hours without waking due to pain.  Goal status: IN PROGRESS  3.  Pt will increase dominant right grip strength to be equal to or greater than left grip strength to  improve ability to grasp and hold pots and pans during meal preparation.  Goal status: IN PROGRESS  4.  Pt will decrease RUE fascial restrictions and swelling to minimal amounts or less to improve mobility required typing and writing.  Goal status: IN PROGRESS  5.  Pt will increase ROM in right wrist and digits to [facility] to improve ability to form a full grasp required for doing ADL's.  Goal status: IN PROGRESS   ASSESSMENT:  CLINICAL IMPRESSION: Pt reporting that he feels that his wrist is finally loosening up some and he is able to move it and his hand more. This session he presented with mild to moderate restrictions through the flexors of his wrist and around the scar, which was addressed with manual therapy at the end of the session. Additionally this session OT added some fine motor tasks to get his digits moving along with his wrist, for a more functional movement. Pt reports that his pain continues to remain around a 3/10 overall. OT providing verbal cuing for positioning and technique.   PERFORMANCE DEFICITS: in functional skills including ADLs, IADLs, sensation, edema, ROM, strength, pain, fascial restrictions, Fine motor control, body mechanics, and UE functional use.   PLAN:  OT FREQUENCY: 1-2x/week  OT DURATION: 4 weeks  PLANNED INTERVENTIONS: self care/ADL training, therapeutic exercise, therapeutic activity, manual therapy, passive range of motion, functional mobility training, electrical stimulation, ultrasound, paraffin, moist heat, cryotherapy, patient/family education, coping strategies training, and DME and/or AE instructions  RECOMMENDED OTHER SERVICES: N/A  CONSULTED AND AGREED WITH PLAN OF CARE: Patient  PLAN FOR NEXT SESSION: Manual Therapy, P/ROM, A/ROM, Fine Motor Skills, Coordination, grip and pinch strength, mini reassessment next session   [facility] [phone] 01/10/2023, 8:59 AM

## 2023-01-12 ENCOUNTER — Encounter (HOSPITAL_COMMUNITY): Payer: No Typology Code available for payment source | Admitting: Occupational Therapy

## 2023-01-16 ENCOUNTER — Encounter (HOSPITAL_COMMUNITY): Payer: Self-pay | Admitting: Occupational Therapy

## 2023-01-16 ENCOUNTER — Ambulatory Visit (HOSPITAL_COMMUNITY): Payer: No Typology Code available for payment source | Admitting: Occupational Therapy

## 2023-01-16 DIAGNOSIS — M25631 Stiffness of right wrist, not elsewhere classified: Secondary | ICD-10-CM

## 2023-01-16 DIAGNOSIS — M25531 Pain in right wrist: Secondary | ICD-10-CM | POA: Diagnosis not present

## 2023-01-16 DIAGNOSIS — R29898 Other symptoms and signs involving the musculoskeletal system: Secondary | ICD-10-CM

## 2023-01-16 NOTE — Therapy (Signed)
OUTPATIENT OCCUPATIONAL THERAPY ORTHO REASSESSMENT & TREATMENT NOTE DISCHARGE SUMMARY   Patient Name: [name] MRN: [id number] DOB:03-22-1961, 62 y.o., male Today's Date: 01/16/2023  PCP: [facility] REFERRING PROVIDER: Josiah Lobo, MD  END OF SESSION:  OT End of Session - 01/16/23 1334     Visit Number 5    Number of Visits 9    Date for OT Re-Evaluation 02/03/23    Authorization Type [employer]et[employer]    OT Start Time 1302    OT Stop Time 1329    OT Time Calculation (min) 27 min    Activity Tolerance Patient tolerated treatment well    Behavior During Therapy WFL for tasks assessed/performed              Past Medical History:  Diagnosis Date   Coronary atherosclerosis of native coronary artery    a. remote LAD stent. b. subsequent stents to RCA with restenosis. c. DES to RCA and Circ 06/2007 in setting of restenosis. d. DES to LAD & RCA in 01/2010. e. DES to Cx and LAD; total occlusion of RCA, LVEF 40-45%.   Diabetes mellitus type 2, diet-controlled ([place])    Essential hypertension    Hepatic steatosis    Hyperlipidemia    Ischemic cardiomyopathy    MI (myocardial infarction) ([place])    Obstructive sleep apnea    Thrombocytopenia ([place])    Past Surgical History:  Procedure Laterality Date   BIOPSY  05/04/2018   Procedure: BIOPSY;  Surgeon: Danie Binder, MD;  [facility];  Service: Endoscopy;;  gastric   CARDIAC CATHETERIZATION N/A 09/17/2015   Procedure: Left Heart Cath and Coronary Angiography;  Surgeon: [name], MD;  Location: [place] [facility];  Service: Cardiovascular;  Laterality: N/A;   COLONOSCOPY N/A 05/04/2018   Procedure: COLONOSCOPY;  Surgeon: Danie Binder, MD;  [facility];  Service: Endoscopy;  Laterality: N/A;  11:00am   CORONARY ANGIOPLASTY WITH STENT PLACEMENT     left carpal tunnel     POLYPECTOMY  05/04/2018   Procedure: POLYPECTOMY;  Surgeon: Danie Binder, MD;  [facility];  Service:  Endoscopy;;  ascending,rectal,gastric, duodenal,   ROTATOR CUFF REPAIR     WISDOM TOOTH EXTRACTION     Patient Active Problem List   Diagnosis Date Noted   Encounter for screening colonoscopy 03/22/2018   Fatty liver 03/22/2018   GERD (gastroesophageal reflux disease) 03/22/2018   CAD S/P percutaneous coronary angioplasty 09/18/2015   Hyperlipidemia 09/18/2015   Thrombocytopenia ([place]) 09/18/2015   Diabetes mellitus type 2, diet-controlled ([place])    Ischemic cardiomyopathy    Ischemic chest pain ([place]) 09/17/2015   Exertional angina 09/17/2015   Type 2 diabetes mellitus ([place]) 01/21/2014   Preoperative cardiovascular examination 07/03/2013   Essential hypertension 01/29/2010   ANGINA, STABLE 09/01/2009   CORONARY ATHEROSCLEROSIS NATIVE CORONARY ARTERY 09/01/2009   DYSLIPIDEMIA 08/31/2009    ONSET DATE: 11/08/23  REFERRING DIAG: R carpal tunnel release  THERAPY DIAG:  Pain in right wrist  Stiffness of right wrist, not elsewhere classified  Other symptoms and signs involving the musculoskeletal system  Rationale for Evaluation and Treatment: Rehabilitation  SUBJECTIVE:   SUBJECTIVE STATEMENT: "I used my hand this morning to move a burn barrell."  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   PRECAUTIONS: None  WEIGHT BEARING RESTRICTIONS: Yes 5+lbs  PAIN:  Are you having pain? No  FALLS: Has patient fallen in last 6 months? No  PATIENT GOALS: Less pain  NEXT MD VISIT: 01/18/23  OBJECTIVE:   HAND DOMINANCE: Right  ADLs: Overall ADLs: Patient reports  difficulty with small object manipulation, severe pain with lifting and pushing objects during cooking and cleaning tasks. Unable to perform yard work or job duties due to pain and limitations with lifting and sustaining holds.   FUNCTIONAL OUTCOME MEASURES: Quick Dash: 43.18 01/16/23: 13.64  UPPER EXTREMITY ROM:     Active ROM Right eval Right 01/16/23  Wrist flexion 44 62  Wrist extension 50  68  Wrist ulnar deviation 32 32  Wrist radial deviation 14 20  Wrist pronation WFL 90  Wrist supination 88 90  (Blank rows = not tested)  Active ROM Right eval Right 01/16/23  Thumb MCP (0-60) 55   Thumb IP (0-80) 55   Thumb Opposition to Small Finger Able   Index MCP (0-90) 80   Index PIP (0-100) 85   Index DIP (0-70)  50   Long MCP (0-90) 80    Long PIP (0-100)  85   Long DIP (0-70)  50   Ring MCP (0-90)  75   Ring PIP (0-100)  100   Ring DIP (0-70)  50   Little MCP (0-90)  65   Little PIP (0-100)  85   Little DIP (0-70)  65   (Blank rows = not tested)    Pt able to make a full fist.  UPPER EXTREMITY MMT:     MMT Right eval Right 01/16/23  Wrist flexion 4+/5 5/5  Wrist extension 4/5 5/5  Wrist ulnar deviation 4/5 4+/5  Wrist radial deviation 4-/5 4+/5  Wrist pronation 4+/5 5/5  Wrist supination 4+/5 5/5  (Blank rows = not tested)  HAND FUNCTION: Grip strength: Right: 35 lbs; Left: 75 lbs, Lateral pinch: Right: 10 lbs, Left: 20 lbs, and 3 point pinch: Right: 11 lbs, Left: 23 lbs 01/16/23: Grip strength: Right: 70 lbs; Lateral pinch: Right: 20 lbs, and 3 point pinch: Right: 15 lbs  COORDINATION: 9 Hole Peg test: Right: 37 sec; Left: 32 sec  EDEMA: Mild swelling in the heel of the palm.   OBSERVATIONS: Stiffness and muscle tension along the flexors and extensors of the R wrists   TODAY'S TREATMENT:                                                                                                                              DATE:  01/16/23 -Strengthening: red theraband-wrist flexion/extension, ulnar/radial deviation, supination/pronation, x10  01/10/23 -Wrist ROM: flexion/extension, ulnar/radial deviation, supination/pronation, x10 -Strengthening: 2lb dumbbell, flexion/extension, ulnar/radial deviation, supination/pronation, x10 -Gripper: 45lb full squeeze x10, 38lbs picking up 5 medium beads -Cards: holding entire deck, using thumb to flick 1 card down at a  time, then flip each card over as he's placing it down, x20, shuffle full deck x5 -Coins: flipping coins over x10, holding 10 or 15 coins and placing them in the piggy bank 1 at a time.  -Manual Therapy: myofascial  release and trigger point massage along the flexors, palmar aspect of hand and thumb, using gua sha  01/06/2023 -Theraputty: red putty, roll into ball, flatten into pancake, tip pinch all the way down "log" -Manual Therapy: myofascial release and trigger point massage - Fine motor: screwing and unscrewing nuts onto bolts with tip pinch grasp, using clip to pinch and pick up foam blocks  -Wrist ROM: flexion/extension, ulnar/radial deviation, supination/pronation 10x each way  - Parrafin: R hand dipped in paraffin wax and wrapped in towel + heat pack for 8 minutes   PATIENT EDUCATION: Education details: red theraband strengthening Person educated: Patient Education method: Explanation and Demonstration Education comprehension: verbalized understanding and returned demonstration  HOME EXERCISE PROGRAM: 2/9: Wrist A/ROM and strengthening 2/14: Theraputty 2/20: Coin and Card tasks 2/26: red theraband strengthening  GOALS: Goals reviewed with patient? Yes  SHORT TERM GOALS: Target date: 02/03/23  Pt will be provided with and educated on HEP to improve pain and mobility in R hand required for ADL completion.   Goal status: MET  2.  Pt will decrease pain in R hand to 2/10 or less in order to sleep for 3+ consecutive hours without waking due to pain.  Goal status: MET  3.  Pt will increase dominant right grip strength to be equal to or greater than left grip strength to improve ability to grasp and hold pots and pans during meal preparation.  Goal status: MET  4.  Pt will decrease RUE fascial restrictions and swelling to minimal amounts or less to improve mobility required typing and writing.  Goal status: MET  5.  Pt will increase ROM in right wrist and digits to [facility] to  improve ability to form a full grasp required for doing ADL's.  Goal status: MET   ASSESSMENT:  CLINICAL IMPRESSION: Reassessment completed this session, pt has met all goals and reports significantly improved pain and sensation. Pt reports he is using the left hand during ADLs as much as he can, does have soreness near the base of the thumb at times. Trace to min fascial restrictions palpated. Added red theraband strengthening to HEP, pt performing with good form and technique. Pt is agreeable to discharge today.   PERFORMANCE DEFICITS: in functional skills including ADLs, IADLs, sensation, edema, ROM, strength, pain, fascial restrictions, Fine motor control, body mechanics, and UE functional use.   PLAN:  OT FREQUENCY: 1-2x/week  OT DURATION: 4 weeks  PLANNED INTERVENTIONS: self care/ADL training, therapeutic exercise, therapeutic activity, manual therapy, passive range of motion, functional mobility training, electrical stimulation, ultrasound, paraffin, moist heat, cryotherapy, patient/family education, coping strategies training, and DME and/or AE instructions  CONSULTED AND AGREED WITH PLAN OF CARE: Patient  PLAN FOR NEXT SESSION: N/A-Discharge    OCCUPATIONAL THERAPY DISCHARGE SUMMARY  Visits from Start of Care: 5  Current functional level related to goals / functional outcomes: See above. Pt has met all goals, demonstrates ROM and strength WFL, great improvements in grip and pinch strength as well. Pt is using RUE during functional tasks with minimal difficulty.    Remaining deficits: Decreased activity tolerance   Education / Equipment: HEP for strengthening   Patient agrees to discharge. Patient goals were met. Patient is being discharged due to meeting the stated rehab goals.Guadelupe Sabin, OTR/L  [phone] 01/16/2023, 1:34 PM

## 2023-01-18 ENCOUNTER — Ambulatory Visit: Payer: No Typology Code available for payment source | Admitting: Surgical

## 2023-01-18 DIAGNOSIS — G5601 Carpal tunnel syndrome, right upper limb: Secondary | ICD-10-CM

## 2023-01-19 ENCOUNTER — Encounter: Payer: Self-pay | Admitting: Surgical

## 2023-01-19 NOTE — Progress Notes (Signed)
Post-Op Visit Note   Patient: [name]           Date of Birth: 1961/07/15           MRN: [id number] Visit Date: 01/18/2023 PCP: Pcp, No   Assessment & Plan:  Chief Complaint:  Chief Complaint  Patient presents with   Right Hand - Routine Post Op, Follow-up   Visit Diagnoses:  1. Carpal tunnel syndrome, right upper limb     Plan: Patient is a 62 year old male who presents s/p right carpal tunnel release on 11/07/2022.  He states that he is doing well.  He initially had some dehiscence of the incision but this has healed well.  He has finished physical therapy for his hand and feels that this was very helpful.  Denies any numbness or tingling in any form.  Does not really have any pain aside from if he puts a lot of pressure in the midportion of the incision.  He has full function of the hand with no real complaints.  Does not wake up with pain at night.  Pain is not limiting him.  On exam, patient has intact EPL, thumb abduction, grip strength, finger abduction.  He has no weakness.  Excellent FPL strength.  Incision is well-healed from prior carpal tunnel release.  There is no evidence of infection or dehiscence.  No sinus tract.  No significant tenderness throughout the incision.  No expressible drainage.  No pain with wrist range of motion.  Plan is to continue with home exercise program and follow-up with the office as needed.  He will try and return to work and let us know if he has any difficulties.  He is okay for full activity.  Follow-up with the office as needed.  Follow-Up Instructions: No follow-ups on file.   Orders:  No orders of the defined types were placed in this encounter.  No orders of the defined types were placed in this encounter.   Imaging: No results found.  PMFS History: Patient Active Problem List   Diagnosis Date Noted   Encounter for screening colonoscopy 03/22/2018   Fatty liver 03/22/2018   GERD (gastroesophageal reflux disease)  03/22/2018   CAD S/P percutaneous coronary angioplasty 09/18/2015   Hyperlipidemia 09/18/2015   Thrombocytopenia ([place]) 09/18/2015   Diabetes mellitus type 2, diet-controlled ([place])    Ischemic cardiomyopathy    Ischemic chest pain ([place]) 09/17/2015   Exertional angina 09/17/2015   Type 2 diabetes mellitus ([place]) 01/21/2014   Preoperative cardiovascular examination 07/03/2013   Essential hypertension 01/29/2010   ANGINA, STABLE 09/01/2009   CORONARY ATHEROSCLEROSIS NATIVE CORONARY ARTERY 09/01/2009   DYSLIPIDEMIA 08/31/2009   Past Medical History:  Diagnosis Date   Coronary atherosclerosis of native coronary artery    a. remote LAD stent. b. subsequent stents to RCA with restenosis. c. DES to RCA and Circ 06/2007 in setting of restenosis. d. DES to LAD & RCA in 01/2010. e. DES to Cx and LAD; total occlusion of RCA, LVEF 40-45%.   Diabetes mellitus type 2, diet-controlled ([place])    Essential hypertension    Hepatic steatosis    Hyperlipidemia    Ischemic cardiomyopathy    MI (myocardial infarction) ([place])    Obstructive sleep apnea    Thrombocytopenia ([facility])     Family History  Problem Relation Age of Onset   Coronary artery disease Other    Hypertension Mother    Diabetes Mother    Heart attack Mother    Diabetes Father  CVA Father    Diabetes Brother    Hypertension Brother    Colon cancer Neg Hx    Colon polyps Neg Hx     Past Surgical History:  Procedure Laterality Date   BIOPSY  05/04/2018   Procedure: BIOPSY;  Surgeon: Danie Binder, MD;  [facility];  Service: Endoscopy;;  gastric   CARDIAC CATHETERIZATION N/A 09/17/2015   Procedure: Left Heart Cath and Coronary Angiography;  Surgeon: [name], MD;  Location: [place] [facility];  Service: Cardiovascular;  Laterality: N/A;   COLONOSCOPY N/A 05/04/2018   Procedure: COLONOSCOPY;  Surgeon: Danie Binder, MD;  [facility];  Service: Endoscopy;  Laterality: N/A;  11:00am   CORONARY ANGIOPLASTY  WITH STENT PLACEMENT     left carpal tunnel     POLYPECTOMY  05/04/2018   Procedure: POLYPECTOMY;  Surgeon: Danie Binder, MD;  [facility];  Service: Endoscopy;;  ascending,rectal,gastric, duodenal,   ROTATOR CUFF REPAIR     WISDOM TOOTH EXTRACTION     Social History   Occupational History   Occupation: Full-time    Employer: [employer]  Tobacco Use   Smoking status: Former    Types: Cigarettes    Start date: 11/21/1977    Quit date: 08/04/2000    Years since quitting: 22.4   Smokeless tobacco: Never   Tobacco comments:    Tobacco - no  Vaping Use   Vaping Use: Never used  Substance and Sexual Activity   Alcohol use: No    Alcohol/week: 0.0 standard drinks of alcohol    Comment: rare, maybe 1 beer once a month    Drug use: No   Sexual activity: Not on file

## 2023-02-06 ENCOUNTER — Encounter: Payer: Self-pay | Admitting: Cardiology

## 2023-02-06 ENCOUNTER — Other Ambulatory Visit: Payer: Self-pay | Admitting: Surgical

## 2023-02-07 ENCOUNTER — Telehealth: Payer: Self-pay | Admitting: Cardiology

## 2023-02-07 MED ORDER — PRASUGREL HCL 10 MG PO TABS: 10.0000 mg | ORAL_TABLET | Freq: Every day | ORAL | 3 refills | Status: DC

## 2023-02-07 MED ORDER — OMEGA-3-ACID ETHYL ESTERS 1 G PO CAPS: 2.0000 | ORAL_CAPSULE | Freq: Two times a day (BID) | ORAL | 3 refills | Status: DC

## 2023-02-07 MED ORDER — ISOSORBIDE MONONITRATE ER 60 MG PO TB24: 60.0000 mg | ORAL_TABLET | Freq: Every day | ORAL | 3 refills | Status: DC

## 2023-02-07 MED ORDER — ROSUVASTATIN CALCIUM 20 MG PO TABS: 20.0000 mg | ORAL_TABLET | Freq: Every day | ORAL | 3 refills | Status: DC

## 2023-02-07 MED ORDER — METOPROLOL SUCCINATE ER 25 MG PO TB24: 75.0000 mg | ORAL_TABLET | Freq: Every day | ORAL | 3 refills | Status: DC

## 2023-02-07 MED ORDER — FENOFIBRATE 160 MG PO TABS: 160.0000 mg | ORAL_TABLET | Freq: Every day | ORAL | 3 refills | Status: DC

## 2023-02-07 MED ORDER — VALSARTAN-HYDROCHLOROTHIAZIDE 320-25 MG PO TABS: 1.0000 | ORAL_TABLET | Freq: Every day | ORAL | 3 refills | Status: DC

## 2023-02-07 NOTE — Telephone Encounter (Signed)
 *  STAT* If patient is at the pharmacy, call can be transferred to refill team.   1. Which medications need to be refilled? (please list name of each medication and dose if known)   omega-3 acid ethyl esters (LOVAZA) 1 g capsule    2. Which pharmacy/location (including street and city if local pharmacy) is medication to be sent to? [facility], [place]   3. Do they need a 30 day or 90 day supply? 90 days  Might need prior auth per pt

## 2023-02-07 NOTE — Telephone Encounter (Signed)
Refill sent to [facility].  Will wait to see if need PA once script is received.

## 2023-02-09 MED ORDER — REPATHA SURECLICK 140 MG/ML ~~LOC~~ SOAJ: 1.0000 mL | SUBCUTANEOUS | 2 refills | Status: DC

## 2023-02-09 NOTE — Addendum Note (Signed)
Addended by: Sung Amabile on: 02/09/2023 03:32 PM   Modules accepted: Orders

## 2023-02-13 ENCOUNTER — Telehealth: Payer: Self-pay

## 2023-02-13 NOTE — Telephone Encounter (Signed)
Prior Authorization for Omega-3-acid Ethyl Esters 1GM capsules:  [id number] Status:Approved Review Type:Prior Auth Coverage Start Date:02/07/2023 Coverage End Date:02/07/2024;

## 2023-02-27 ENCOUNTER — Encounter: Payer: Self-pay | Admitting: Cardiology

## 2023-03-02 ENCOUNTER — Other Ambulatory Visit (HOSPITAL_COMMUNITY): Payer: Self-pay

## 2023-03-03 ENCOUNTER — Other Ambulatory Visit (HOSPITAL_COMMUNITY): Payer: Self-pay

## 2023-03-03 ENCOUNTER — Telehealth: Payer: Self-pay

## 2023-03-03 MED ORDER — REPATHA SURECLICK 140 MG/ML ~~LOC~~ SOAJ: 1.0000 mL | SUBCUTANEOUS | 11 refills | Status: DC

## 2023-03-03 NOTE — Telephone Encounter (Signed)
Pulled up initial prior authorization. This was filled out incorrectly. Primary hyperlipidemia was selected as the indication. ASCVD should have been selected - he has prior stents in 2008 and 2011 as well as angina. He already takes high intensity statin therapy with rosuvastatin 20mg  daily + Repatha which he has been on since 2020. LDL excellent at 4 when last checked in July 2023. Baseline LDL 127 back in 2016.   Called pt's insurance, they were able to resubmit a prior auth which was approved immediately through 03/02/24. Pt made aware.

## 2023-03-16 ENCOUNTER — Encounter: Payer: Self-pay | Admitting: Cardiology

## 2023-03-16 MED ORDER — VALSARTAN-HYDROCHLOROTHIAZIDE 320-25 MG PO TABS: 1.0000 | ORAL_TABLET | Freq: Every day | ORAL | 1 refills | Status: DC

## 2023-04-04 ENCOUNTER — Encounter: Payer: Self-pay | Admitting: *Deleted

## 2023-04-10 ENCOUNTER — Other Ambulatory Visit: Payer: Self-pay

## 2023-04-10 MED ORDER — ROSUVASTATIN CALCIUM 20 MG PO TABS: 20.0000 mg | ORAL_TABLET | Freq: Every day | ORAL | 3 refills | Status: DC

## 2023-04-20 ENCOUNTER — Ambulatory Visit (INDEPENDENT_AMBULATORY_CARE_PROVIDER_SITE_OTHER): Payer: Commercial Managed Care - PPO | Admitting: Gastroenterology

## 2023-04-20 ENCOUNTER — Encounter: Payer: Self-pay | Admitting: Gastroenterology

## 2023-04-20 VITALS — BP 102/63 | HR 86 | Temp 98.0°F | Ht 72.0 in | Wt 208.0 lb

## 2023-04-20 DIAGNOSIS — Z86010 Personal history of colonic polyps: Secondary | ICD-10-CM | POA: Diagnosis not present

## 2023-04-20 DIAGNOSIS — K2990 Gastroduodenitis, unspecified, without bleeding: Secondary | ICD-10-CM | POA: Diagnosis not present

## 2023-04-20 DIAGNOSIS — K219 Gastro-esophageal reflux disease without esophagitis: Secondary | ICD-10-CM

## 2023-04-20 NOTE — Progress Notes (Signed)
Gastroenterology Office Note     Primary Care Physician:  Roe Rutherford, NP Primary GI: Dr. Jena Gauss (previously seen by Dr. Darrick Penna only for initial screening colonoscopy)    Chief Complaint   Chief Complaint  Patient presents with   Colonoscopy    Ov before colonoscopy, pt stated no problems     History of Present Illness   [name] is a 62 y.o. male presenting today to arrange surveillance colonoscopy. Last colonoscopy in 2019 with view obscured by oily film, two tubular adenomas. EGD at that time also with mild gastritis/duodenitis.   History of chronic GERD on omeprazole 20 mg daily. Tried to wean off but was unable to do this. No dysphagia. Symptoms controlled on PPI once daily.   No abdominal pain, N/V, changes in bowel habits, constipation, diarrhea, overt GI bleeding, dysphagia, unexplained weight loss, lack of appetite, unexplained weight gain.    He is on Effient with history of multiple cardiac stents in the past. MI X 2 in 2001. Last cath in 2016. Stable from cardiac standpoint.     2019 colonoscopy: obscured view by oily film. Two sessile polyps in rectum and ascending colon, 5-8 mm in size, s/p removal. Internal hemorrhoids. (Tubular adenomas).    June 2019 EGD: mild gastritis/DUODENITIS AND BENIGN STOMACH POLYPS.     Past Medical History:  Diagnosis Date   Coronary atherosclerosis of native coronary artery    a. remote LAD stent. b. subsequent stents to RCA with restenosis. c. DES to RCA and Circ 06/2007 in setting of restenosis. d. DES to LAD & RCA in 01/2010. e. DES to Cx and LAD; total occlusion of RCA, LVEF 40-45%.   diabetes    Essential hypertension    Hepatic steatosis    Hyperlipidemia    Ischemic cardiomyopathy    MI (myocardial infarction) (HCC)    Obstructive sleep apnea    Thrombocytopenia (HCC)     Past Surgical History:  Procedure Laterality Date   BIOPSY  05/04/2018   Procedure: BIOPSY;  Surgeon: West Bali,  MD;  Location: [facility];  Service: Endoscopy;;  gastric   CARDIAC CATHETERIZATION N/A 09/17/2015   Procedure: Left Heart Cath and Coronary Angiography;  Surgeon: Tonny Bollman, MD;  Location: [facility] INVASIVE CV LAB;  Service: Cardiovascular;  Laterality: N/A;   COLONOSCOPY N/A 05/04/2018   Procedure: COLONOSCOPY;  Surgeon: West Bali, MD;  Location: [facility];  Service: Endoscopy;  Laterality: N/A;  11:00am   CORONARY ANGIOPLASTY WITH STENT PLACEMENT     multiple   left carpal tunnel     POLYPECTOMY  05/04/2018   Procedure: POLYPECTOMY;  Surgeon: West Bali, MD;  Location: [facility];  Service: Endoscopy;;  ascending,rectal,gastric, duodenal,   right carpal tunnel     Dec 2023   ROTATOR CUFF REPAIR     WISDOM TOOTH EXTRACTION      Current Outpatient Medications  Medication Sig Dispense Refill   aspirin 81 MG tablet Take 1 tablet (81 mg total) by mouth daily. 90 tablet 3   Coenzyme Q10 (COQ-10) 200 MG CAPS Take 200 mg by mouth daily.      Evolocumab (REPATHA SURECLICK) 140 MG/ML SOAJ Inject 140 mg into the skin every 14 (fourteen) days. 2 mL 11   fenofibrate 160 MG tablet Take 1 tablet (160 mg total) by mouth daily. 90 tablet 3   isosorbide mononitrate (IMDUR) 60 MG 24 hr tablet Take 1 tablet (60  mg total) by mouth daily. 90 tablet 3   MAGNESIUM GLUCONATE PO Take 100 mg by mouth 2 (two) times daily.      metoprolol succinate (TOPROL-XL) 25 MG 24 hr tablet Take 3 tablets (75 mg total) by mouth daily. 270 tablet 3   MOUNJARO 7.5 MG/0.5ML Pen 12.5 mg.     Multiple Vitamin (MULTIVITAMIN) tablet Take 1 tablet by mouth daily.     nitroGLYCERIN (NITROSTAT) 0.4 MG SL tablet Place 1 tablet (0.4 mg total) under the tongue every 5 (five) minutes x 3 doses as needed for chest pain (if no relief afte 2nd dose, proceed to the ED for an evaluation or call 911). For chest pain 100 tablet 1   omega-3 acid ethyl esters (LOVAZA) 1 g capsule Take 2 capsules (2 g total) by mouth 2 (two)  times daily. 360 capsule 3   omeprazole (PRILOSEC) 20 MG capsule Take 1 capsule (20 mg total) by mouth daily. 90 capsule 3   prasugrel (EFFIENT) 10 MG TABS tablet Take 1 tablet (10 mg total) by mouth daily. 90 tablet 3   rosuvastatin (CRESTOR) 20 MG tablet Take 1 tablet (20 mg total) by mouth daily. 90 tablet 3   SYNJARDY XR 25-1000 MG TB24 Take 2 tablets by mouth daily.     Testosterone 1.62 % GEL APPLY 2 PUMPS  TOPICALLY ONCE DAILY AS  DIRECTED     valsartan-hydrochlorothiazide (DIOVAN-HCT) 320-25 MG tablet Take 1 tablet by mouth daily. 90 tablet 1   No current facility-administered medications for this visit.    Allergies as of 04/20/2023 - Review Complete 04/20/2023  Allergen Reaction Noted   Reopro [abciximab] Other (See Comments) 11/09/2011    Family History  Problem Relation Age of Onset   Coronary artery disease Other    Hypertension Mother    Diabetes Mother    Heart attack Mother    Diabetes Father    CVA Father    Diabetes Brother    Hypertension Brother    Colon cancer Neg Hx    Colon polyps Neg Hx     Social History   Socioeconomic History   Marital status: Married    Spouse name: Not on file   Number of children: Not on file   Years of education: Not on file   Highest education level: Not on file  Occupational History   Occupation: Full-time    Employer: [employer]  Tobacco Use   Smoking status: Former    Types: Cigarettes    Start date: 11/21/1977    Quit date: 08/04/2000    Years since quitting: 22.7   Smokeless tobacco: Never   Tobacco comments:    Tobacco - no  Vaping Use   Vaping Use: Never used  Substance and Sexual Activity   Alcohol use: No    Alcohol/week: 0.0 standard drinks of alcohol    Comment: rare, maybe 1 beer once a month    Drug use: No   Sexual activity: Not on file  Other Topics Concern   Not on file  Social History Narrative   Not on file   Social Determinants of Health   Financial Resource Strain: Not on file   Food Insecurity: Not on file  Transportation Needs: Not on file  Physical Activity: Not on file  Stress: Not on file  Social Connections: Not on file  Intimate Partner Violence: Not on file     Review of Systems   Gen: Denies any fever, chills, fatigue, weight loss, lack  of appetite.  CV: Denies chest pain, heart palpitations, peripheral edema, syncope.  Resp: Denies shortness of breath at rest or with exertion. Denies wheezing or cough.  GI: Denies dysphagia or odynophagia. Denies jaundice, hematemesis, fecal incontinence. GU : Denies urinary burning, urinary frequency, urinary hesitancy MS: Denies joint pain, muscle weakness, cramps, or limitation of movement.  Derm: Denies rash, itching, dry skin Psych: Denies depression, anxiety, memory loss, and confusion Heme: Denies bruising, bleeding, and enlarged lymph nodes.   Physical Exam   BP 102/63   Pulse 86   Temp 98 F (36.7 C)   Ht 6' (1.829 m)   Wt 208 lb (94.3 kg)   BMI 28.21 kg/m  General:   Alert and oriented. Pleasant and cooperative. Well-nourished and well-developed.  Head:  Normocephalic and atraumatic. Eyes:  Without icterus Ears:  Normal auditory acuity. Lungs:  Clear to auscultation bilaterally.  Heart:  S1, S2 present without murmurs appreciated.  Abdomen:  +BS, soft, non-tender and non-distended. No HSM noted. No guarding or rebound. No masses appreciated.  Rectal:  Deferred  Msk:  Symmetrical without gross deformities. Normal posture. Extremities:  Without edema. Neurologic:  Alert and  oriented x4;  grossly normal neurologically. Skin:  Intact without significant lesions or rashes. Psych:  Alert and cooperative. Normal mood and affect.   Assessment   [name] is a 62 y.o. male presenting today to arrange surveillance colonoscopy. Last colonoscopy in 2019 with view obscured by oily film, two tubular adenomas. EGD at that time also with mild gastritis/duodenitis.   Chronic GERD: controlled on 20  mg omeprazole daily. No alarm signs/symptoms. EGD without Barrett's in 2019.   History of colon polyps: in 2019. No concerning lower GI signs/symptoms. No FH colon cancer or polyps.   Will remain on Effient for procedure upcoming. History of cardiac stents with last cath in 2016. Stable from cardiac standpoint.     PLAN    Proceed with colonoscopy by Dr. Jena Gauss in near future: the risks, benefits, and alternatives have been discussed with the patient in detail. The patient states understanding and desires to proceed. ASA 3  Hold Mounjaro one week prior  No synjardy day of procedure  Gelene Mink, PhD, ANP-BC [facility]

## 2023-04-20 NOTE — Patient Instructions (Signed)
We are arranging a colonoscopy with Dr. Jena Gauss in the near future.   You will continue Effient.  Hold Mounjaro one week prior to procedure. No Synjardy day of procedure.  I enjoyed seeing you again today! I value our relationship and want to provide genuine, compassionate, and quality care. You may receive a survey regarding your visit with me, and I welcome your feedback! Thanks so much for taking the time to complete this. I look forward to seeing you again.      Gelene Mink, PhD, ANP-BC [facility]

## 2023-04-21 ENCOUNTER — Telehealth: Payer: Self-pay | Admitting: *Deleted

## 2023-04-21 NOTE — Telephone Encounter (Signed)
LMOVM to call back to schedule TCS with Dr. Jena Gauss, ASA 3, stay on effient, hold mounjaro x 1 week, no synjardy day of procedure

## 2023-04-24 ENCOUNTER — Other Ambulatory Visit: Payer: Self-pay

## 2023-04-24 MED ORDER — NITROGLYCERIN 0.4 MG SL SUBL: 0.4000 mg | SUBLINGUAL_TABLET | SUBLINGUAL | 3 refills | Status: AC | PRN

## 2023-04-27 ENCOUNTER — Telehealth: Payer: Self-pay | Admitting: Internal Medicine

## 2023-04-27 NOTE — Telephone Encounter (Signed)
See prior message

## 2023-04-27 NOTE — Telephone Encounter (Signed)
I saw the previous note that is why I messaged you

## 2023-04-27 NOTE — Telephone Encounter (Signed)
Patient left a message to get the colonoscopy scheduled

## 2023-04-28 NOTE — Telephone Encounter (Signed)
LMOVM advising we will call back to schedule once Dr. Luvenia Starch August schedule is released as since we have called him, his schedule is now full

## 2023-05-30 ENCOUNTER — Encounter: Payer: Self-pay | Admitting: Cardiology

## 2023-05-31 ENCOUNTER — Other Ambulatory Visit: Payer: Self-pay | Admitting: Cardiology

## 2023-05-31 MED ORDER — FENOFIBRATE 160 MG PO TABS: 160.0000 mg | ORAL_TABLET | Freq: Every day | ORAL | 0 refills | Status: DC

## 2023-05-31 MED ORDER — METOPROLOL SUCCINATE ER 25 MG PO TB24: 75.0000 mg | ORAL_TABLET | Freq: Every day | ORAL | 0 refills | Status: DC

## 2023-06-06 ENCOUNTER — Telehealth: Payer: Self-pay | Admitting: *Deleted

## 2023-06-06 MED ORDER — PEG 3350-KCL-NA BICARB-NACL 420 G PO SOLR: 4000.0000 mL | Freq: Once | ORAL | 0 refills | Status: AC

## 2023-06-06 NOTE — Telephone Encounter (Signed)
Spoke with pt spouse. He has been scheduled for TCS with Dr. Jena Gauss ASA 3 8/7. Aware will send instructions/pre-op via mychart. Aware needs to hold moujaro 7 days prior. Rx for prep to be sent to pharmacy.

## 2023-06-07 ENCOUNTER — Encounter: Payer: Self-pay | Admitting: *Deleted

## 2023-06-07 NOTE — Telephone Encounter (Signed)
PA submitted via UMR.Case ID# [id number]

## 2023-06-08 ENCOUNTER — Ambulatory Visit: Payer: Commercial Managed Care - PPO | Attending: Student | Admitting: Student

## 2023-06-08 ENCOUNTER — Encounter: Payer: Self-pay | Admitting: Student

## 2023-06-08 VITALS — BP 112/76 | HR 97 | Ht 72.0 in | Wt 211.2 lb

## 2023-06-08 DIAGNOSIS — I25118 Atherosclerotic heart disease of native coronary artery with other forms of angina pectoris: Secondary | ICD-10-CM | POA: Diagnosis not present

## 2023-06-08 DIAGNOSIS — I10 Essential (primary) hypertension: Secondary | ICD-10-CM

## 2023-06-08 DIAGNOSIS — E785 Hyperlipidemia, unspecified: Secondary | ICD-10-CM

## 2023-06-08 DIAGNOSIS — I255 Ischemic cardiomyopathy: Secondary | ICD-10-CM | POA: Diagnosis not present

## 2023-06-08 MED ORDER — VALSARTAN 320 MG PO TABS: 320.0000 mg | ORAL_TABLET | Freq: Every day | ORAL | 1 refills | Status: DC

## 2023-06-08 MED ORDER — HYDROCHLOROTHIAZIDE 12.5 MG PO CAPS: 12.5000 mg | ORAL_CAPSULE | Freq: Every day | ORAL | 1 refills | Status: DC

## 2023-06-08 NOTE — Patient Instructions (Signed)
Medication Instructions:   Reduce Hydrochlorothiazide to 12.5mg  daily. New prescription sent in for Hydrochlorothiazide and Valsartan.   Return BP log in 3-4 weeks.   *If you need a refill on your cardiac medications before your next appointment, please call your pharmacy*   Follow-Up: At [facility], you and your health needs are our priority.  As part of our continuing mission to provide you with exceptional heart care, we have created designated Provider Care Teams.  These Care Teams include your primary Cardiologist (physician) and Advanced Practice Providers (APPs -  Physician Assistants and Nurse Practitioners) who all work together to provide you with the care you need, when you need it.  We recommend signing up for the patient portal called "MyChart".  Sign up information is provided on this After Visit Summary.  MyChart is used to connect with patients for Virtual Visits (Telemedicine).  Patients are able to view lab/test results, encounter notes, upcoming appointments, etc.  Non-urgent messages can be sent to your provider as well.   To learn more about what you can do with MyChart, go to [email or web address].    Your next appointment:   6 month(s)  Provider:   You may see Nona Dell, MD or one of the following Advanced Practice Providers on your designated Care Team:   [place], PA-C  Jacolyn Reedy, [place]

## 2023-06-08 NOTE — Telephone Encounter (Signed)
PA approved. Auth# Case ID:  [id number], DOS: 06/28/23-09/28/23

## 2023-06-08 NOTE — Progress Notes (Addendum)
Cardiology Office Note    Date:  06/08/2023  ID:  [name], DOB Aug 26, 1961, MRN [id number] Cardiologist: Nona Dell, MD    History of Present Illness:    [name] is a 62 y.o. 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  He was last examined by Ronie Spies, PA in 05/2022 and reported having chronic stable angina but denied any progression of symptoms.  BP was elevated and he was encouraged to follow this at home and report back with results in 1 week. If this remained elevated, it was recommended to change Metoprolol to Carvedilol or add Spironolactone. I cannot see where he reported back on these and it does not appear that changes have been made to his cardiac medications in the interim.  In talking with the patient today, he reports he has now retired and is trying to exercise more routinely. He has been using a Peloton Treadmill on a daily basis and works out for 20-30 minutes. Denies any chest pain or dyspnea on exertion if performing his workout in the morning but does experience brief angina if exercising in the evening. He questions if this is due to his Imdur wearing off and we reviewed he could try taking this as a divided dose of 30mg  BID to see if symptoms improve. No progression of symptoms. Has not utilized SL NTG. No recent palpitations, orthopnea, PND or pitting edema. Does report intermittent dizziness if changing positions too quickly. He has lost 20 lbs since last year due to dietary changes and being on Mounjaro.   Studies Reviewed:   EKG: EKG is ordered today and demonstrates:   EKG Interpretation Date/Time:  Thursday June 08 2023 14:56:03 EDT Ventricular Rate:  97 PR Interval:  184 QRS  Duration:  100 QT Interval:  346 QTC Calculation: 439 R Axis:   187  Text Interpretation: Normal sinus rhythm Inferior infarct (cited on or before 03-Mar-2006) Confirmed by Randall An ([id number]) on 06/08/2023 3:03:02 PM       Echocardiogram: 12/2018 IMPRESSIONS     1. The left ventricle has mildly reduced systolic function of 45-50%. The  cavity size was normal. There is mildly increased left ventricular wall  thickness. Echo evidence of impaired diastolic relaxation.   2. The right ventricle has normal systolic function. The cavity was  normal. There is no increase in right ventricular wall thickness. Right  ventricular systolic pressure could not be assessed.   3. The aortic valve is tricuspid There is mild aortic annular  calcification noted.   4. The mitral valve is normal in structure.   5. The tricuspid valve is normal in structure.   6. The aortic root is normal in size and structure.   7. There is akinesis of the apical inferior left ventricular segment.   8. There is akinesis of the mid-apical anteroseptal left ventricular  segment.   Physical Exam:   VS:  BP 112/76   Pulse 97   Ht 6' (1.829 m)   Wt 211 lb 3.2 oz (95.8 kg)   SpO2 95%   BMI 28.64 kg/m    Wt Readings from Last 3  Encounters:  06/08/23 211 lb 3.2 oz (95.8 kg)  04/20/23 208 lb (94.3 kg)  05/26/22 228 lb (103.4 kg)     GEN: Pleasant male appearing in no acute distress NECK: No JVD; No carotid bruits CARDIAC: RRR, no murmurs, rubs, gallops RESPIRATORY:  Clear to auscultation without rales, wheezing or rhonchi  ABDOMEN: Appears non-distended. No obvious abdominal masses. EXTREMITIES: No clubbing or cyanosis. No pitting edema.  Distal pedal pulses are 2+ bilaterally.   Assessment and Plan:   1. CAD - He is s/p stents to LAD and RCA, DES to RCA and LCx in 2008, DES to LAD and RCA in 2011 and DES to LCx and LAD in 2016 with CTO of RCA noted.  - He has stable angina with no recent progression  of symptoms. We did review dividing out his Imdur dose as discussed above. Continue ASA, Effient, Imdur, Toprol-XL, Crestor and Repatha. He does report having an upcoming colonoscopy and says he was informed he did not need to hold Effient prior to his procedure. I will send a note to Dr. Jena Gauss today to verify this prior to his procedure. From a cardiac perspective, he is able to perform more than 4 METS of activity without angina and would not require further cardiac testing prior to this.   Addendum: 06/15/2023: Dr. Jena Gauss did recommend that he remain on Effient for his procedure with no indication to hold at this time. Called the patient with this information.   2. HFmrEF - His EF was at 45% in 2016 and similar at 45-50% by repeat echo in 12/2018. He appears eulovemic by examination today.  - Continue current medical therapy with Imdur 60 mg daily, Toprol-XL 75 mg daily and Valsartan 320mg  daily. Will reduce Hydrochlorothiazide to 12.5mg  daily as discussed below. Also on Synjardy for Type 2 DM as well.   3. HTN - BP is at 112/76 during today's visit. Given his dizziness with positional changes concerning for orthostasis, I did recommend reducing Hydrochlorothiazide from 25mg  daily to 12.5mg  daily. Since this is not available in his combination pill, he was provided with a separate Rx for this and Valsartan 320mg  daily. I encouraged him to keep a BP log and pending his trend, we might be able to stop Hydrochlorothiazide which should help with his nocturia as well. Continue Imdur and Toprol-XL at current dosing.   4. HLD - FLP in 03/2023 showed total cholesterol 78, triglycerides 356, HDL 35 and LDL listed as being negative.  Triglycerides had improved as they were previously elevated to greater than 600. - Continue current medical therapy with Repatha, Fenofibrate, Crestor and Lovaza.   Signed, Ellsworth Lennox, PA-C

## 2023-06-23 ENCOUNTER — Telehealth: Payer: Self-pay | Admitting: *Deleted

## 2023-06-23 NOTE — Telephone Encounter (Signed)
-----   Message from Anabel Bene sent at 06/23/2023  8:12 AM EDT ----- This patient called and states the needs to cancel because his mother is in the hospital, he does want to reschedule.  I have put him in the depot.  Thanks,

## 2023-06-23 NOTE — Telephone Encounter (Signed)
Will call patient to reschedule once we receive future schedule TCS, Dr. Jena Gauss, ASA 3

## 2023-06-26 ENCOUNTER — Encounter (HOSPITAL_COMMUNITY): Admission: RE | Admit: 2023-06-26 | Payer: Commercial Managed Care - PPO | Source: Ambulatory Visit

## 2023-06-28 ENCOUNTER — Encounter (HOSPITAL_COMMUNITY): Admission: RE | Payer: Self-pay | Source: Home / Self Care

## 2023-06-28 ENCOUNTER — Ambulatory Visit (HOSPITAL_COMMUNITY)
Admission: RE | Admit: 2023-06-28 | Payer: Commercial Managed Care - PPO | Source: Home / Self Care | Admitting: Internal Medicine

## 2023-06-28 SURGERY — COLONOSCOPY WITH PROPOFOL
Anesthesia: Monitor Anesthesia Care

## 2023-07-04 NOTE — Telephone Encounter (Signed)
Probably best to do that.

## 2023-07-04 NOTE — Telephone Encounter (Signed)
Patient sent PCP message regarding BP readings and is starting medication and has to keep check and let them know. Since he has not been seen since May, should he be seen again prior to rescheduling? Thanks!

## 2023-07-12 NOTE — Telephone Encounter (Signed)
noted 

## 2023-07-12 NOTE — Telephone Encounter (Signed)
He is having lower BP readings. He is following up with cardiology via MyChart by submitting another BP log. As long as that is stable, we can go forward with colonoscopy but need an updated med list.

## 2023-08-07 MED ORDER — PEG 3350-KCL-NA BICARB-NACL 420 G PO SOLR: 4000.0000 mL | Freq: Once | ORAL | 0 refills | Status: AC

## 2023-08-07 NOTE — Addendum Note (Signed)
Addended by: Armstead Peaks on: 08/07/2023 12:43 PM   Modules accepted: Orders

## 2023-08-07 NOTE — Telephone Encounter (Signed)
Spoke with pt. Reports BP's are normal. Scheduled for 10/16. Aware will send new instructions/pre-op. Needs new prep rx.

## 2023-08-08 ENCOUNTER — Encounter: Payer: Self-pay | Admitting: *Deleted

## 2023-08-08 NOTE — Telephone Encounter (Signed)
PA submitted via UMR for PA Case ID# [id number]

## 2023-08-09 NOTE — Telephone Encounter (Signed)
PA approved. [id number], DOS: 09/06/23-12/04/23

## 2023-08-27 ENCOUNTER — Other Ambulatory Visit: Payer: Self-pay | Admitting: Cardiology

## 2023-09-01 NOTE — Patient Instructions (Signed)
[name]  09/01/2023     @PREFPERIOPPHARMACY @   Your procedure is scheduled on  09/05/2023.   Report to [facility] at  0815  A.M.   Call this number if you have problems the morning of surgery:  [phone]  If you experience any cold or flu symptoms such as cough, fever, chills, shortness of breath, etc. between now and your scheduled surgery, please notify us at the above number.   Remember:  Follow the diet and prep instructions given to you by the office.   You may drink clear liquids until 0445 am on 09/06/2023.    Clear liquids allowed are:                    Water, Black Coffee Only (No creamer, milk or cream, including half & half and powdered creamer), and Clear Sports drink (No red color; diabetics please choose diet or no sugar options)     Take these medicines the morning of surgery with A SIP OF WATER                    isosorbide, metoprolol, omeprazole.     Do not wear jewelry, make-up or nail polish, including gel polish,  artificial nails, or any other type of covering on natural nails (fingers and  toes).  Do not wear lotions, powders, or perfumes, or deodorant.  Do not shave 48 hours prior to surgery.  Men may shave face and neck.  Do not bring valuables to the hospital.  [facility] is not responsible for any belongings or valuables.  Contacts, dentures or bridgework may not be worn into surgery.  Leave your suitcase in the car.  After surgery it may be brought to your room.  For patients admitted to the hospital, discharge time will be determined by your treatment team.  Patients discharged the day of surgery will not be allowed to drive home and must have someone with them for 24 hours.     Special instructions:   DO NOT smoke tobacco or vape for 24 hours.  Please read over the following fact sheets that you were given. Anesthesia Post-op Instructions and Care and Recovery After Surgery      Colonoscopy, Adult, Care After The  following information offers guidance on how to care for yourself after your procedure. Your health care provider may also give you more specific instructions. If you have problems or questions, contact your health care provider. What can I expect after the procedure? After the procedure, it is common to have: A small amount of blood in your stool for 24 hours after the procedure. Some gas. Mild cramping or bloating of your abdomen. Follow these instructions at home: Eating and drinking  Drink enough fluid to keep your urine pale yellow. Follow instructions from your health care provider about eating or drinking restrictions. Resume your normal diet as told by your health care provider. Avoid heavy or fried foods that are hard to digest. Activity Rest as told by your health care provider. Avoid sitting for a long time without moving. Get up to take short walks every 1-2 hours. This is important to improve blood flow and breathing. Ask for help if you feel weak or unsteady. Return to your normal activities as told by your health care provider. Ask your health care provider what activities are safe for you. Managing cramping and bloating  Try walking around when you have cramps  or feel bloated. If directed, apply heat to your abdomen as told by your health care provider. Use the heat source that your health care provider recommends, such as a moist heat pack or a heating pad. Place a towel between your skin and the heat source. Leave the heat on for 20-30 minutes. Remove the heat if your skin turns bright red. This is especially important if you are unable to feel pain, heat, or cold. You have a greater risk of getting burned. General instructions If you were given a sedative during the procedure, it can affect you for several hours. Do not drive or operate machinery until your health care provider says that it is safe. For the first 24 hours after the procedure: Do not sign important  documents. Do not drink alcohol. Do your regular daily activities at a slower pace than normal. Eat soft foods that are easy to digest. Take over-the-counter and prescription medicines only as told by your health care provider. Keep all follow-up visits. This is important. Contact a health care provider if: You have blood in your stool 2-3 days after the procedure. Get help right away if: You have more than a small spotting of blood in your stool. You have large blood clots in your stool. You have swelling of your abdomen. You have nausea or vomiting. You have a fever. You have increasing pain in your abdomen that is not relieved with medicine. These symptoms may be an emergency. Get help right away. Call 911. Do not wait to see if the symptoms will go away. Do not drive yourself to the hospital. Summary After the procedure, it is common to have a small amount of blood in your stool. You may also have mild cramping and bloating of your abdomen. If you were given a sedative during the procedure, it can affect you for several hours. Do not drive or operate machinery until your health care provider says that it is safe. Get help right away if you have a lot of blood in your stool, nausea or vomiting, a fever, or increased pain in your abdomen. This information is not intended to replace advice given to you by your health care provider. Make sure you discuss any questions you have with your health care provider. Document Revised: 12/20/2022 Document Reviewed: 06/30/2021 Elsevier Patient Education  2024 [employer] Monitored Anesthesia Care, Care After The following information offers guidance on how to care for yourself after your procedure. Your health care provider may also give you more specific instructions. If you have problems or questions, contact your health care provider. What can I expect after the procedure? After the procedure, it is common to have: Tiredness. Little or no  memory about what happened during or after the procedure. Impaired judgment when it comes to making decisions. Nausea or vomiting. Some trouble with balance. Follow these instructions at home: For the time period you were told by your health care provider:  Rest. Do not participate in activities where you could fall or become injured. Do not drive or use machinery. Do not drink alcohol. Do not take sleeping pills or medicines that cause drowsiness. Do not make important decisions or sign legal documents. Do not take care of children on your own. Medicines Take over-the-counter and prescription medicines only as told by your health care provider. If you were prescribed antibiotics, take them as told by your health care provider. Do not stop using the antibiotic even if you start to feel better. Eating and  drinking Follow instructions from your health care provider about what you may eat and drink. Drink enough fluid to keep your urine pale yellow. If you vomit: Drink clear fluids slowly and in small amounts as you are able. Clear fluids include water, ice chips, low-calorie sports drinks, and fruit juice that has water added to it (diluted fruit juice). Eat light and bland foods in small amounts as you are able. These foods include bananas, applesauce, rice, lean meats, toast, and crackers. General instructions  Have a responsible adult stay with you for the time you are told. It is important to have someone help care for you until you are awake and alert. If you have sleep apnea, surgery and some medicines can increase your risk for breathing problems. Follow instructions from your health care provider about wearing your sleep device: When you are sleeping. This includes during daytime naps. While taking prescription pain medicines, sleeping medicines, or medicines that make you drowsy. Do not use any products that contain nicotine or tobacco. These products include cigarettes, chewing  tobacco, and vaping devices, such as e-cigarettes. If you need help quitting, ask your health care provider. Contact a health care provider if: You feel nauseous or vomit every time you eat or drink. You feel light-headed. You are still sleepy or having trouble with balance after 24 hours. You get a rash. You have a fever. You have redness or swelling around the IV site. Get help right away if: You have trouble breathing. You have new confusion after you get home. These symptoms may be an emergency. Get help right away. Call 911. Do not wait to see if the symptoms will go away. Do not drive yourself to the hospital. This information is not intended to replace advice given to you by your health care provider. Make sure you discuss any questions you have with your health care provider. Document Revised: 04/04/2022 Document Reviewed: 04/04/2022 Elsevier Patient Education  2024 [employer].

## 2023-09-04 ENCOUNTER — Encounter (HOSPITAL_COMMUNITY): Payer: Self-pay

## 2023-09-04 ENCOUNTER — Encounter (HOSPITAL_COMMUNITY)
Admission: RE | Admit: 2023-09-04 | Discharge: 2023-09-04 | Disposition: A | Discharge status: Home / Self Care | Payer: Commercial Managed Care - PPO | Source: Ambulatory Visit | Attending: Internal Medicine | Admitting: Internal Medicine

## 2023-09-04 VITALS — BP 138/86 | HR 67 | Temp 98.1°F | Resp 16 | Ht 72.0 in | Wt 196.2 lb

## 2023-09-04 DIAGNOSIS — D696 Thrombocytopenia, unspecified: Secondary | ICD-10-CM | POA: Diagnosis not present

## 2023-09-04 DIAGNOSIS — Z01812 Encounter for preprocedural laboratory examination: Secondary | ICD-10-CM | POA: Diagnosis present

## 2023-09-04 DIAGNOSIS — Z794 Long term (current) use of insulin: Secondary | ICD-10-CM | POA: Diagnosis not present

## 2023-09-04 DIAGNOSIS — K760 Fatty (change of) liver, not elsewhere classified: Secondary | ICD-10-CM | POA: Diagnosis not present

## 2023-09-04 DIAGNOSIS — E1159 Type 2 diabetes mellitus with other circulatory complications: Secondary | ICD-10-CM | POA: Diagnosis not present

## 2023-09-04 LAB — COMPREHENSIVE METABOLIC PANEL
ALT: 36 U/L (ref 0–44)
AST: 28 U/L (ref 15–41)
Albumin: 4.6 g/dL (ref 3.5–5.0)
Alkaline Phosphatase: 37 U/L — ABNORMAL LOW (ref 38–126)
Anion gap: 10 (ref 5–15)
BUN: 16 mg/dL (ref 6–20)
CO2: 22 mmol/L (ref 22–32)
Calcium: 9.2 mg/dL (ref 8.9–10.3)
Chloride: 104 mmol/L (ref 98–111)
Creatinine, Ser: 0.98 mg/dL (ref 0.61–1.24)
GFR, Estimated: >60 mL/min (ref >60)
Glucose, Bld: 112 mg/dL — ABNORMAL HIGH (ref 70–99)
Potassium: 3.9 mmol/L (ref 3.5–5.1)
Sodium: 136 mmol/L (ref 135–145)
Total Bilirubin: 0.6 mg/dL (ref 0.3–1.2)
Total Protein: 7.5 g/dL (ref 6.5–8.1)

## 2023-09-04 LAB — CBC WITH DIFFERENTIAL/PLATELET
Abs Immature Granulocytes: 0.01 10*3/uL (ref 0.00–0.07)
Basophils Absolute: 0 10*3/uL (ref 0.0–0.1)
Basophils Relative: 1 %
Eosinophils Absolute: 0.1 10*3/uL (ref 0.0–0.5)
Eosinophils Relative: 2 %
HCT: 44.6 % (ref 39.0–52.0)
Hemoglobin: 14.8 g/dL (ref 13.0–17.0)
Immature Granulocytes: 0 %
Lymphocytes Relative: 31 %
Lymphs Abs: 1.7 10*3/uL (ref 0.7–4.0)
MCH: 29 pg (ref 26.0–34.0)
MCHC: 33.2 g/dL (ref 30.0–36.0)
MCV: 87.3 fL (ref 80.0–100.0)
Monocytes Absolute: 0.4 10*3/uL (ref 0.1–1.0)
Monocytes Relative: 7 %
Neutro Abs: 3.1 10*3/uL (ref 1.7–7.7)
Neutrophils Relative %: 59 %
Platelets: 142 10*3/uL — ABNORMAL LOW (ref 150–400)
RBC: 5.11 MIL/uL (ref 4.22–5.81)
RDW: 13.1 % (ref 11.5–15.5)
WBC: 5.3 10*3/uL (ref 4.0–10.5)
nRBC: 0 % (ref 0.0–0.2)

## 2023-09-04 LAB — PROTIME-INR
INR: 1 (ref 0.8–1.2)
Prothrombin Time: 13.5 s (ref 11.4–15.2)

## 2023-09-06 ENCOUNTER — Ambulatory Visit (HOSPITAL_COMMUNITY)
Admission: RE | Admit: 2023-09-06 | Discharge: 2023-09-06 | Disposition: A | Discharge status: Home / Self Care | Payer: Commercial Managed Care - PPO | Attending: Internal Medicine | Admitting: Internal Medicine

## 2023-09-06 ENCOUNTER — Ambulatory Visit (HOSPITAL_COMMUNITY): Payer: Commercial Managed Care - PPO | Admitting: Anesthesiology

## 2023-09-06 ENCOUNTER — Encounter (HOSPITAL_COMMUNITY): Payer: Self-pay | Admitting: Internal Medicine

## 2023-09-06 ENCOUNTER — Encounter (HOSPITAL_COMMUNITY)
Admission: RE | Disposition: A | Discharge status: Home / Self Care | Payer: Self-pay | Source: Home / Self Care | Attending: Internal Medicine

## 2023-09-06 DIAGNOSIS — D120 Benign neoplasm of cecum: Secondary | ICD-10-CM | POA: Insufficient documentation

## 2023-09-06 DIAGNOSIS — D126 Benign neoplasm of colon, unspecified: Secondary | ICD-10-CM

## 2023-09-06 DIAGNOSIS — Z09 Encounter for follow-up examination after completed treatment for conditions other than malignant neoplasm: Secondary | ICD-10-CM | POA: Insufficient documentation

## 2023-09-06 DIAGNOSIS — I2510 Atherosclerotic heart disease of native coronary artery without angina pectoris: Secondary | ICD-10-CM | POA: Diagnosis not present

## 2023-09-06 DIAGNOSIS — Z860100 Personal history of colon polyps, unspecified: Secondary | ICD-10-CM

## 2023-09-06 DIAGNOSIS — G4733 Obstructive sleep apnea (adult) (pediatric): Secondary | ICD-10-CM | POA: Insufficient documentation

## 2023-09-06 DIAGNOSIS — Z1211 Encounter for screening for malignant neoplasm of colon: Secondary | ICD-10-CM | POA: Diagnosis not present

## 2023-09-06 DIAGNOSIS — I10 Essential (primary) hypertension: Secondary | ICD-10-CM | POA: Insufficient documentation

## 2023-09-06 DIAGNOSIS — Z860101 Personal history of adenomatous and serrated colon polyps: Secondary | ICD-10-CM | POA: Insufficient documentation

## 2023-09-06 DIAGNOSIS — I252 Old myocardial infarction: Secondary | ICD-10-CM | POA: Insufficient documentation

## 2023-09-06 DIAGNOSIS — E119 Type 2 diabetes mellitus without complications: Secondary | ICD-10-CM | POA: Diagnosis not present

## 2023-09-06 DIAGNOSIS — Z7985 Long-term (current) use of injectable non-insulin antidiabetic drugs: Secondary | ICD-10-CM | POA: Insufficient documentation

## 2023-09-06 DIAGNOSIS — D123 Benign neoplasm of transverse colon: Secondary | ICD-10-CM | POA: Diagnosis not present

## 2023-09-06 HISTORY — PX: COLONOSCOPY WITH PROPOFOL: SHX5780

## 2023-09-06 HISTORY — PX: POLYPECTOMY: SHX149

## 2023-09-06 LAB — GLUCOSE, CAPILLARY: Glucose-Capillary: 100 mg/dL — ABNORMAL HIGH (ref 70–99)

## 2023-09-06 SURGERY — COLONOSCOPY WITH PROPOFOL
Anesthesia: General

## 2023-09-06 MED ORDER — LACTATED RINGERS IV SOLN: INTRAVENOUS | Status: DC | PRN

## 2023-09-06 MED ORDER — LIDOCAINE HCL (CARDIAC) PF 100 MG/5ML IV SOSY
PREFILLED_SYRINGE | INTRAVENOUS | Status: DC | PRN
Start: 2023-09-06 — End: 2023-09-06
  Administered 2023-09-06: 60 mg via INTRATRACHEAL

## 2023-09-06 MED ORDER — PROPOFOL 10 MG/ML IV BOLUS
INTRAVENOUS | Status: DC | PRN
  Administered 2023-09-06: 100 mg via INTRAVENOUS

## 2023-09-06 MED ORDER — PROPOFOL 500 MG/50ML IV EMUL
INTRAVENOUS | Status: DC | PRN
  Administered 2023-09-06: 200 ug/kg/min via INTRAVENOUS

## 2023-09-06 NOTE — Discharge Instructions (Signed)
  Colonoscopy Discharge Instructions  Read the instructions outlined below and refer to this sheet in the next few weeks. These discharge instructions provide you with general information on caring for yourself after you leave the hospital. Your doctor may also give you specific instructions. While your treatment has been planned according to the most current medical practices available, unavoidable complications occasionally occur. If you have any problems or questions after discharge, call Dr. Jena Gauss at [phone]. ACTIVITY You may resume your regular activity, but move at a slower pace for the next 24 hours.  Take frequent rest periods for the next 24 hours.  Walking will help get rid of the air and reduce the bloated feeling in your belly (abdomen).  No driving for 24 hours (because of the medicine (anesthesia) used during the test).   Do not sign any important legal documents or operate any machinery for 24 hours (because of the anesthesia used during the test).  NUTRITION Drink plenty of fluids.  You may resume your normal diet as instructed by your doctor.  Begin with a light meal and progress to your normal diet. Heavy or fried foods are harder to digest and may make you feel sick to your stomach (nauseated).  Avoid alcoholic beverages for 24 hours or as instructed.  MEDICATIONS You may resume your normal medications unless your doctor tells you otherwise.  WHAT YOU CAN EXPECT TODAY Some feelings of bloating in the abdomen.  Passage of more gas than usual.  Spotting of blood in your stool or on the toilet paper.  IF YOU HAD POLYPS REMOVED DURING THE COLONOSCOPY: No aspirin products for 7 days or as instructed.  No alcohol for 7 days or as instructed.  Eat a soft diet for the next 24 hours.  FINDING OUT THE RESULTS OF YOUR TEST Not all test results are available during your visit. If your test results are not back during the visit, make an appointment with your caregiver to find out the  results. Do not assume everything is normal if you have not heard from your caregiver or the medical facility. It is important for you to follow up on all of your test results.  SEEK IMMEDIATE MEDICAL ATTENTION IF: You have more than a spotting of blood in your stool.  Your belly is swollen (abdominal distention).  You are nauseated or vomiting.  You have a temperature over 101.  You have abdominal pain or discomfort that is severe or gets worse throughout the day.    2 polyps removed from your colon  Further recommendations to follow pending review of pathology report   at patient request, I called Khaleef Ruby at [phone]-UUVO rolled to voicemail-left a message

## 2023-09-06 NOTE — Transfer of Care (Signed)
Immediate Anesthesia Transfer of Care Note  Patient: [name]  Procedure(s) Performed: COLONOSCOPY WITH PROPOFOL POLYPECTOMY INTESTINAL  Patient Location: Endoscopy Unit  Anesthesia Type:General  Level of Consciousness: drowsy  Airway & Oxygen Therapy: Patient Spontanous Breathing  Post-op Assessment: Report given to RN and Post -op Vital signs reviewed and stable  Post vital signs: Reviewed and stable  Last Vitals:  Vitals Value Taken Time  BP 100/70   Temp 98   Pulse 89   Resp 16   SpO2 96     Last Pain:  Vitals:   09/06/23 0808  TempSrc:   PainSc: 0-No pain         Complications: No notable events documented.

## 2023-09-06 NOTE — Op Note (Signed)
[facility] Patient Name: [name] Procedure Date: 09/06/2023 7:54 AM MRN: [id number] Date of Birth: 11/08/1961 Attending MD: Gennette Pac , MD, [id number] CSN: [id number] Age: 62 Admit Type: Outpatient Procedure:                Colonoscopy Indications:              High risk colon cancer surveillance: Personal                            history of colonic polyps Providers:                Gennette Pac, MD, Crystal Page, Lennice Sites                            Technician, Technician Referring MD:              Medicines:                Propofol per Anesthesia Complications:            No immediate complications. Estimated Blood Loss:     Estimated blood loss was minimal. Procedure:                Pre-Anesthesia Assessment:                           - Prior to the procedure, a History and Physical                            was performed, and patient medications and                            allergies were reviewed. The patient's tolerance of                            previous anesthesia was also reviewed. The risks                            and benefits of the procedure and the sedation                            options and risks were discussed with the patient.                            All questions were answered, and informed consent                            was obtained. Prior Anticoagulants: The patient has                            taken no anticoagulant or antiplatelet agents. ASA                            Grade Assessment: III - A patient with severe  systemic disease. After reviewing the risks and                            benefits, the patient was deemed in satisfactory                            condition to undergo the procedure.                           After obtaining informed consent, the colonoscope                            was passed under direct vision. Throughout the                            procedure,  the patient's blood pressure, pulse, and                            oxygen saturations were monitored continuously. The                            [id number] scope was introduced through the                            anus and advanced to the the cecum, identified by                            appendiceal orifice and ileocecal valve. The                            colonoscopy was performed without difficulty. The                            patient tolerated the procedure well. The quality                            of the bowel preparation was adequate. The                            ileocecal valve, appendiceal orifice, and rectum                            were photographed. Scope In: 8:11:37 AM Scope Out: 8:28:32 AM Scope Withdrawal Time: 0 hours 11 minutes 48 seconds  Total Procedure Duration: 0 hours 16 minutes 55 seconds  Findings:      The perianal and digital rectal examinations were normal.      Two sessile polyps were found in the splenic flexure and cecum. The       polyps were 3 to 5 mm in size. These polyps were removed with a cold       snare. Resection and retrieval were complete. Estimated blood loss was       minimal.      The exam was otherwise without abnormality on direct and retroflexion       views. Impression:               -  Two 3 to 5 mm polyps at the splenic flexure and                            in the cecum, removed with a cold snare. Resected                            and retrieved.                           - The examination was otherwise normal on direct                            and retroflexion views. Moderate Sedation:      Moderate (conscious) sedation was personally administered by an       anesthesia professional. The following parameters were monitored: oxygen       saturation, heart rate, blood pressure, respiratory rate, EKG, adequacy       of pulmonary ventilation, and response to care. Recommendation:           - Patient has a  contact number available for                            emergencies. The signs and symptoms of potential                            delayed complications were discussed with the                            patient. Return to normal activities tomorrow.                            Written discharge instructions were provided to the                            patient.                           - Advance diet as tolerated.                           - Continue present medications.                           - Repeat colonoscopy date to be determined after                            pending pathology results are reviewed for                            surveillance.                           - Return to GI office (date not yet determined). Procedure Code(s):        --- Professional ---  16109, Colonoscopy, flexible; with removal of                            tumor(s), polyp(s), or other lesion(s) by snare                            technique Diagnosis Code(s):        --- Professional ---                           Z86.010, Personal history of colonic polyps                           D12.3, Benign neoplasm of transverse colon (hepatic                            flexure or splenic flexure)                           D12.0, Benign neoplasm of cecum CPT copyright 2022 [employer]. All rights reserved. The codes documented in this report are preliminary and upon coder review may  be revised to meet current compliance requirements. [name], MD Gennette Pac, MD 09/06/2023 8:33:10 AM This report has been signed electronically. Number of Addenda: 0

## 2023-09-06 NOTE — H&P (Signed)
@LOGO @   Primary Care Physician:  Roe Rutherford, NP Primary Gastroenterologist:  Dr. Jena Gauss  Pre-Procedure History & Physical: HPI:  [name] is a 62 y.o. male here for surveillance colonoscopy.  History of colonic adenomas removed from his colon in 2019.  He is here for surveillance examination.  Past Medical History:  Diagnosis Date   Coronary atherosclerosis of native coronary artery    a. remote LAD stent. b. subsequent stents to RCA with restenosis. c. DES to RCA and Circ 06/2007 in setting of restenosis. d. DES to LAD & RCA in 01/2010. e. DES to Cx and LAD; total occlusion of RCA, LVEF 40-45%.   diabetes    Essential hypertension    Hepatic steatosis    Hyperlipidemia    Ischemic cardiomyopathy    MI (myocardial infarction) (HCC)    Obstructive sleep apnea    Thrombocytopenia (HCC)     Past Surgical History:  Procedure Laterality Date   BIOPSY  05/04/2018   Procedure: BIOPSY;  Surgeon: West Bali, MD;  Location: [facility];  Service: Endoscopy;;  gastric   CARDIAC CATHETERIZATION N/A 09/17/2015   Procedure: Left Heart Cath and Coronary Angiography;  Surgeon: Tonny Bollman, MD;  Location: [facility] INVASIVE CV LAB;  Service: Cardiovascular;  Laterality: N/A;   COLONOSCOPY N/A 05/04/2018   Procedure: COLONOSCOPY;  Surgeon: West Bali, MD;  Location: [facility];  Service: Endoscopy;  Laterality: N/A;  11:00am   CORONARY ANGIOPLASTY WITH STENT PLACEMENT     multiple   left carpal tunnel     POLYPECTOMY  05/04/2018   Procedure: POLYPECTOMY;  Surgeon: West Bali, MD;  Location: [facility];  Service: Endoscopy;;  ascending,rectal,gastric, duodenal,   right carpal tunnel     Dec 2023   ROTATOR CUFF REPAIR     WISDOM TOOTH EXTRACTION      Prior to Admission medications   Medication Sig Start Date End Date Taking? Authorizing Provider  aspirin 81 MG tablet Take 1 tablet (81 mg total) by mouth daily. 09/18/15  Yes Dunn, Dayna N, PA-C  fenofibrate 160 MG  tablet TAKE 1 TABLET DAILY 08/28/23  Yes Jonelle Sidle, MD  hydrochlorothiazide (MICROZIDE) 12.5 MG capsule Take 1 capsule (12.5 mg total) by mouth daily. 06/08/23  Yes Strader, Lennart Pall, PA-C  isosorbide mononitrate (IMDUR) 60 MG 24 hr tablet Take 1 tablet (60 mg total) by mouth daily. 02/07/23  Yes Jonelle Sidle, MD  metoprolol succinate (TOPROL-XL) 25 MG 24 hr tablet TAKE 3 TABLETS DAILY 08/28/23  Yes Jonelle Sidle, MD  omeprazole (PRILOSEC) 20 MG capsule Take 1 capsule (20 mg total) by mouth daily. 10/24/22  Yes Jonelle Sidle, MD  prasugrel (EFFIENT) 10 MG TABS tablet Take 1 tablet (10 mg total) by mouth daily. 02/07/23  Yes Jonelle Sidle, MD  rosuvastatin (CRESTOR) 20 MG tablet Take 1 tablet (20 mg total) by mouth daily. 04/10/23  Yes Jonelle Sidle, MD  valsartan (DIOVAN) 320 MG tablet Take 1 tablet (320 mg total) by mouth daily. 06/08/23  Yes Strader, Lennart Pall, PA-C  Coenzyme Q10 (COQ-10) 200 MG CAPS Take 200 mg by mouth daily.     [provider]  Evolocumab (REPATHA SURECLICK) 140 MG/ML SOAJ Inject 140 mg into the skin every 14 (fourteen) days. 03/03/23   Jonelle Sidle, MD  MAGNESIUM GLUCONATE PO Take 100 mg by mouth 2 (two) times daily.     [provider]  MOUNJARO 7.5 MG/0.5ML Pen 12.5  mg. 10/28/22   [provider]  Multiple Vitamin (MULTIVITAMIN) tablet Take 1 tablet by mouth daily.    [provider]  nitroGLYCERIN (NITROSTAT) 0.4 MG SL tablet Place 1 tablet (0.4 mg total) under the tongue every 5 (five) minutes x 3 doses as needed for chest pain (if no relief afte 2nd dose, proceed to the ED for an evaluation or call 911). For chest pain 04/24/23   Jonelle Sidle, MD  omega-3 acid ethyl esters (LOVAZA) 1 g capsule Take 2 capsules (2 g total) by mouth 2 (two) times daily. 02/07/23   Jonelle Sidle, MD  SYNJARDY XR 25-1000 MG TB24 Take 2 tablets by mouth daily. 08/06/19   [provider]  Testosterone 1.62 %  GEL APPLY 2 PUMPS  TOPICALLY ONCE DAILY AS  DIRECTED Patient not taking: Reported on 06/08/2023 10/28/22   [provider]  sodium chloride (OCEAN NASAL SPRAY) 0.65 % nasal spray Place 1 spray into the nose as needed for congestion. 11/09/11 12/03/19  Jaci Carrel, PA-C    Allergies as of 08/07/2023 - Review Complete 06/08/2023  Allergen Reaction Noted   Reopro [abciximab] Other (See Comments) 11/09/2011    Family History  Problem Relation Age of Onset   Coronary artery disease Other    Hypertension Mother    Diabetes Mother    Heart attack Mother    Diabetes Father    CVA Father    Diabetes Brother    Hypertension Brother    Colon cancer Neg Hx    Colon polyps Neg Hx     Social History   Socioeconomic History   Marital status: Married    Spouse name: Not on file   Number of children: Not on file   Years of education: Not on file   Highest education level: Not on file  Occupational History   Occupation: Full-time    Employer: [employer]  Tobacco Use   Smoking status: Former    Current packs/day: 0.00    Types: Cigarettes    Start date: 11/21/1977    Quit date: 08/04/2000    Years since quitting: 23.1   Smokeless tobacco: Never   Tobacco comments:    Tobacco - no  Vaping Use   Vaping status: Never Used  Substance and Sexual Activity   Alcohol use: No    Alcohol/week: 0.0 standard drinks of alcohol    Comment: rare, maybe 1 beer once a month    Drug use: No   Sexual activity: Not on file  Other Topics Concern   Not on file  Social History Narrative   Not on file   Social Determinants of Health   Financial Resource Strain: Low Risk  (01/10/2023)   Received from [facility], [facility]   Overall Financial Resource Strain (CARDIA)    Difficulty of Paying Living Expenses: Not hard at all  Food Insecurity: No Food Insecurity (01/10/2023)   Received from [facility], [facility]   Hunger Vital Sign    Worried About Running Out of Food in  the Last Year: Never true    Ran Out of Food in the Last Year: Never true  Transportation Needs: No Transportation Needs (01/10/2023)   Received from [facility], [facility]   PRAPARE - Transportation    Lack of Transportation (Medical): No    Lack of Transportation (Non-Medical): No  Physical Activity: Unknown (01/10/2023)   Received from [facility], [facility]   Exercise Vital Sign    Days  of Exercise per Week: 0 days    Minutes of Exercise per Session: Not on file  Stress: No Stress Concern Present (01/10/2023)   Received from [facility], [facility] - Occupational Stress Questionnaire    Feeling of Stress : Not at all  Social Connections: Socially Integrated (01/10/2023)   Received from [facility], [facility]   Social Network    How would you rate your social network (family, work, friends)?: Good participation with social networks  Intimate Partner Violence: Not At Risk (01/10/2023)   Received from [facility], [facility]   HITS    Over the last 12 months how often did your partner physically hurt you?: 1    Over the last 12 months how often did your partner insult you or talk down to you?: 1    Over the last 12 months how often did your partner threaten you with physical harm?: 1    Over the last 12 months how often did your partner scream or curse at you?: 1    Review of Systems: See HPI, otherwise negative ROS  Physical Exam: BP (!) 134/94   Pulse 77   Temp 98 F (36.7 C) (Oral)   Resp 16   Ht 6' (1.829 m)   Wt 89 kg   SpO2 98%   BMI 26.61 kg/m  General:   Alert,  Well-developed, well-nourished, pleasant and cooperative in NAD Neck:  Supple; no masses or thyromegaly. No significant cervical adenopathy. Lungs:  Clear throughout to auscultation.   No wheezes, crackles, or rhonchi. No acute distress. Heart:  Regular rate and rhythm; no murmurs, clicks, rubs,  or gallops. Abdomen: Non-distended, normal  bowel sounds.  Soft and nontender without appreciable mass or hepatosplenomegaly.   Impression/Plan: 62 year old gentleman history of colonic adenomas.  He is here for surveillance colonoscopy. The risks, benefits, limitations, alternatives and imponderables have been reviewed with the patient. Questions have been answered. All parties are agreeable.       Notice: This dictation was prepared with Dragon dictation along with smaller phrase technology. Any transcriptional errors that result from this process are unintentional and may not be corrected upon review.

## 2023-09-06 NOTE — Anesthesia Preprocedure Evaluation (Signed)
Anesthesia Evaluation  Patient identified by MRN, date of birth, ID band Patient awake    Reviewed: Allergy & Precautions, H&P , NPO status , Patient's Chart, lab work & pertinent test results, reviewed documented beta blocker date and time   Airway Mallampati: II  TM Distance: >3 FB Neck ROM: full    Dental no notable dental hx.    Pulmonary neg pulmonary ROS, sleep apnea , former smoker   Pulmonary exam normal breath sounds clear to auscultation       Cardiovascular Exercise Tolerance: Good hypertension, + angina  + CAD and + Past MI  negative cardio ROS  Rhythm:regular Rate:Normal     Neuro/Psych negative neurological ROS  negative psych ROS   GI/Hepatic negative GI ROS, Neg liver ROS,GERD  ,,  Endo/Other  negative endocrine ROSdiabetes    Renal/GU negative Renal ROS  negative genitourinary   Musculoskeletal   Abdominal   Peds  Hematology negative hematology ROS (+)   Anesthesia Other Findings   Reproductive/Obstetrics negative OB ROS                             Anesthesia Physical Anesthesia Plan  ASA: 3  Anesthesia Plan: General   Post-op Pain Management:    Induction:   PONV Risk Score and Plan: Propofol infusion  Airway Management Planned:   Additional Equipment:   Intra-op Plan:   Post-operative Plan:   Informed Consent: I have reviewed the patients History and Physical, chart, labs and discussed the procedure including the risks, benefits and alternatives for the proposed anesthesia with the patient or authorized representative who has indicated his/her understanding and acceptance.     Dental Advisory Given  Plan Discussed with: CRNA  Anesthesia Plan Comments:        Anesthesia Quick Evaluation

## 2023-09-07 ENCOUNTER — Encounter: Payer: Self-pay | Admitting: Internal Medicine

## 2023-09-07 LAB — SURGICAL PATHOLOGY

## 2023-09-07 NOTE — Anesthesia Postprocedure Evaluation (Signed)
Anesthesia Post Note  Patient: [name]  Procedure(s) Performed: COLONOSCOPY WITH PROPOFOL POLYPECTOMY INTESTINAL  Patient location during evaluation: Phase II Anesthesia Type: General Level of consciousness: awake Pain management: pain level controlled Vital Signs Assessment: post-procedure vital signs reviewed and stable Respiratory status: spontaneous breathing and respiratory function stable Cardiovascular status: blood pressure returned to baseline and stable Postop Assessment: no headache and no apparent nausea or vomiting Anesthetic complications: no Comments: Late entry   No notable events documented.   Last Vitals:  Vitals:   09/06/23 0716 09/06/23 0831  BP: (!) 134/94 101/68  Pulse: 77 93  Resp: 16 18  Temp: 36.7 C (!) 36.1 C  SpO2: 98% 96%    Last Pain:  Vitals:   09/06/23 0831  TempSrc: Axillary  PainSc: 0-No pain                 Windell Norfolk

## 2023-09-08 ENCOUNTER — Encounter: Payer: Self-pay | Admitting: Student

## 2023-09-13 ENCOUNTER — Encounter (HOSPITAL_COMMUNITY): Payer: Self-pay | Admitting: Internal Medicine

## 2023-11-18 ENCOUNTER — Other Ambulatory Visit: Payer: Self-pay | Admitting: Student

## 2023-12-05 ENCOUNTER — Encounter: Payer: Self-pay | Admitting: Student

## 2023-12-05 ENCOUNTER — Ambulatory Visit: Payer: Commercial Managed Care - PPO | Attending: Student | Admitting: Student

## 2023-12-05 VITALS — BP 118/58 | HR 78 | Ht 72.0 in | Wt 205.0 lb

## 2023-12-05 DIAGNOSIS — E785 Hyperlipidemia, unspecified: Secondary | ICD-10-CM | POA: Diagnosis not present

## 2023-12-05 DIAGNOSIS — I255 Ischemic cardiomyopathy: Secondary | ICD-10-CM | POA: Diagnosis not present

## 2023-12-05 DIAGNOSIS — I10 Essential (primary) hypertension: Secondary | ICD-10-CM | POA: Diagnosis not present

## 2023-12-05 DIAGNOSIS — I25118 Atherosclerotic heart disease of native coronary artery with other forms of angina pectoris: Secondary | ICD-10-CM

## 2023-12-05 NOTE — Patient Instructions (Signed)
 Medication Instructions:   Your physician recommends that you continue on your current medications as directed. Please refer to the Current Medication list given to you today.   Labwork: None today  Testing/Procedures: None today  Follow-Up: 6 months   Call or message us  when you are ready to have echocardiogram  Any Other Special Instructions Will Be Listed Below (If Applicable).  If you need a refill on your cardiac medications before your next appointment, please call your pharmacy.

## 2023-12-05 NOTE — Progress Notes (Signed)
 Cardiology Office Note    Date:  12/05/2023  ID:  Alm LITTIE Cedar, DOB [birth year], MRN [id number] Cardiologist: Jayson Sierras, MD    History of Present Illness:    MASAYUKI SAKAI is a 63 y.o. 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  He was examined by myself in 05/2023 and had been exercising more routinely by using a treadmill on a daily basis and denied any anginal symptoms if working out in the morning, but did experience brief angina if exercising in the evening and we reviewed he could try dividing out his Imdur  dosing to 30 mg twice daily to see if this helped with symptoms. He did report dizziness with positional changes and HCTZ was reduced from 25 mg daily to 12.5 mg daily. HCTZ was ultimately discontinued based off BP readings he provided via MyChart.  In talking with the patient today, he reports still having stable angina which has overall been unchanged. He typically takes Imdur  in the early morning hours and is able to work out approximately 1 to 2 hours after taking this without any chest pain or significant dyspnea. He uses a treadmill or exercise bike for over 30 minutes each day. He has not had to utilize sublingual nitroglycerin . He denies any specific orthopnea, PND, pitting edema or palpitations.  Studies Reviewed:   EKG: EKG is not ordered today.  LHC: 08/2015 1. Severe multivessel coronary artery disease as detailed with total occlusion of the RCA, severe de novo stenosis of the left circumflex, and severe distal edge stent restenosis in the LAD 2. Moderate segmental LV systolic dysfunction with LVEF estimated at 40-45%, wall motion pattern consistent with this patient's known history of anterior  infarction 3. Successful 2 vessel PCI with stenting of the circumflex and LAD using a drug-eluting stent platforms   The patient should be maintained on long-term dual antiplatelet therapy. He is currently tolerating aspirin  and Effient .  Echocardiogram: 12/2018 IMPRESSIONS     1. The left ventricle has mildly reduced systolic function of 45-50%. The  cavity size was normal. There is mildly increased left ventricular wall  thickness. Echo evidence of impaired diastolic relaxation.   2. The right ventricle has normal systolic function. The cavity was  normal. There is no increase in right ventricular wall thickness. Right  ventricular systolic pressure could not be assessed.   3. The aortic valve is tricuspid There is mild aortic annular  calcification noted.   4. The mitral valve is normal in structure.   5. The tricuspid valve is normal in structure.   6. The aortic root is normal in size and structure.   7. There is akinesis of the apical inferior left ventricular segment.   8. There is akinesis of the mid-apical anteroseptal left ventricular  segment.   Physical Exam:   VS:  BP (!) 118/58 (BP Location: Left Arm, Patient Position: Sitting, Cuff Size: Normal)   Pulse 78   Ht 6' (1.829 m)   Wt 205 lb (93 kg)   SpO2 97%   BMI 27.80 kg/m    Wt Readings from Last 3 Encounters:  12/05/23 205 lb (93 kg)  09/06/23 196 lb 3.4 oz (89 kg)  09/04/23 196 lb 3.4 oz (89 kg)     GEN: Well nourished, well developed male appearing in no acute distress NECK: No JVD; No carotid bruits CARDIAC: RRR, no murmurs, rubs, gallops RESPIRATORY:  Clear to auscultation without rales, wheezing or rhonchi  ABDOMEN: Appears non-distended. No obvious abdominal masses. EXTREMITIES: No clubbing or cyanosis. No pitting edema.  Distal pedal pulses are 2+ bilaterally.   Assessment and Plan:   1. CAD with Stable Angina - He has undergone multiple interventions as discussed above with most recent being in  2016. He does have stable angina but no recent progression of symptoms. We reviewed possibly titrating Imdur  from 60 mg daily to 90 mg daily but he wishes to continue his current regimen for now given his stable symptoms. I encouraged him to make us  aware if this changes. We also discussed obtaining a follow-up echocardiogram as discussed below. - Continue ASA 81 mg daily, Toprol -XL 75 mg daily, Imdur  60 mg daily, Effient  10 mg daily (previously recommended to continue long-term DAPT given his multiple interventions), Repatha  and Crestor  10 mg daily.  2. Chronic HFmrEF - His ejection fraction was at 45 to 50% by most recent echocardiogram in 12/2018. He appears euvolemic by examination today and denies any recent respiratory issues. I did recommend we obtain a follow-up echocardiogram given the time frame since his last study and he wishes to check with his wife in regards to this given the new year and deductible. I encouraged him to call us  or send a MyChart message if he wishes to proceed in scheduling this prior to his next visit. - For now, continue current medical therapy with Imdur  60 mg daily, Toprol -XL 75 mg daily and Valsartan  320 mg daily.  3. HLD - FLP in 08/2023 showed total cholesterol 57, triglycerides 154, HDL 35 and LDL was actually listed as being in a negative range. Remains on Repatha , Fenofibrate  and Crestor  10 mg daily (recently reduced by his PCP from 20mg  daily to 10mg  daily).  4. HTN - Blood pressure is well-controlled at 118/58 during today's visit and rechecked and at 122/68. Continue current medical therapy with Imdur  60 mg daily, Toprol -XL 75 mg daily and Valsartan  320 mg daily.  Signed, Laymon CHRISTELLA Qua, PA-C

## 2024-02-10 ENCOUNTER — Other Ambulatory Visit: Payer: Self-pay | Admitting: Cardiology

## 2024-02-13 ENCOUNTER — Other Ambulatory Visit (HOSPITAL_COMMUNITY): Payer: Self-pay

## 2024-02-13 ENCOUNTER — Telehealth: Payer: Self-pay | Admitting: Pharmacy Technician

## 2024-02-13 NOTE — Telephone Encounter (Signed)
 Pharmacy Patient Advocate Encounter  Received notification from [facility] that Prior Authorization for Omega-3-acid Ethyl Esters 1GM capsules has been APPROVED from 01/14/24 to 02/12/25   [facility]  PA #/Case ID/Reference #: [id number]

## 2024-02-13 NOTE — Telephone Encounter (Signed)
 Pharmacy Patient Advocate Encounter   Received notification from Fax that prior authorization for omega-3 acid ethyl esters 1 g capsule is required/requested.   Insurance verification completed.   The patient is insured through [employer] .   Per test claim: PA required; PA submitted to above mentioned insurance via CoverMyMeds Key/confirmation #/EOC [id number] Status is pending

## 2024-02-18 ENCOUNTER — Other Ambulatory Visit: Payer: Self-pay | Admitting: Cardiology

## 2024-02-26 ENCOUNTER — Other Ambulatory Visit: Payer: Self-pay | Admitting: Cardiology

## 2024-02-26 ENCOUNTER — Other Ambulatory Visit: Payer: Self-pay

## 2024-02-26 MED ORDER — VALSARTAN 320 MG PO TABS: 320.0000 mg | ORAL_TABLET | Freq: Every day | ORAL | 2 refills | Status: DC

## 2024-03-18 ENCOUNTER — Telehealth: Payer: Self-pay | Admitting: Pharmacy Technician

## 2024-03-18 ENCOUNTER — Telehealth: Payer: Self-pay | Admitting: Pharmacist Clinician (PhC)/ Clinical Pharmacy Specialist

## 2024-03-18 NOTE — Telephone Encounter (Signed)
 Pharmacy Patient Advocate Encounter   Received notification from Pt Calls Messages that prior authorization for repatha  is required/requested.   Insurance verification completed.   The patient is insured through [employer] .   Per test claim: PA required; PA submitted to above mentioned insurance via CoverMyMeds Key/confirmation #/EOC [employer] Status is pending

## 2024-03-18 NOTE — Telephone Encounter (Signed)
 Received notification from pharmacy that updated PA required.    KEY   [id number]

## 2024-03-20 ENCOUNTER — Encounter: Payer: Self-pay | Admitting: Pharmacy Technician

## 2024-03-20 NOTE — Telephone Encounter (Signed)
 Pharmacy Patient Advocate Encounter  Received notification from [facility] that Prior Authorization for repatha  has been APPROVED from 02/17/24 to 03/20/25. Spoke to pharmacy to process.Copay is $0.00.    PA #/Case ID/Reference #: [id number]   Pharmacy Patient Advocate Encounter    PA #/Case ID/Reference #: [id number]

## 2024-05-17 ENCOUNTER — Other Ambulatory Visit: Payer: Self-pay | Admitting: Cardiology

## 2024-07-10 ENCOUNTER — Ambulatory Visit: Admitting: Cardiology

## 2024-07-11 ENCOUNTER — Ambulatory Visit: Attending: Cardiology | Admitting: Cardiology

## 2024-07-11 ENCOUNTER — Encounter: Payer: Self-pay | Admitting: Cardiology

## 2024-07-11 VITALS — BP 140/84 | HR 84 | Ht 72.0 in | Wt 208.8 lb

## 2024-07-11 DIAGNOSIS — I10 Essential (primary) hypertension: Secondary | ICD-10-CM | POA: Diagnosis not present

## 2024-07-11 DIAGNOSIS — I25118 Atherosclerotic heart disease of native coronary artery with other forms of angina pectoris: Secondary | ICD-10-CM | POA: Diagnosis not present

## 2024-07-11 DIAGNOSIS — E782 Mixed hyperlipidemia: Secondary | ICD-10-CM | POA: Diagnosis not present

## 2024-07-11 MED ORDER — RANOLAZINE ER 500 MG PO TB12
500.0000 mg | ORAL_TABLET | Freq: Two times a day (BID) | ORAL | 3 refills | Status: AC
Start: 2024-07-11 — End: ?

## 2024-07-11 NOTE — Progress Notes (Signed)
    Cardiology Office Note  Date: 07/11/2024   ID: [name], DOB [birth year], MRN [id number]  History of Present Illness: [name] is a 63 y.o. male last seen in January by Ms. Strader PA-C, I reviewed her note.  Our last visit was in 2021.  He is here for a routine visit.  Overall doing reasonably well.  He has been exercising mainly in the mornings, has less angina after his morning medications.  If he tries to exercise in the afternoon, he does have regular angina.  He is still grieving the loss of his mother who passed away back in 04/21/2024.  He reports no sudden palpitations or syncope.  Stable NYHA class II dyspnea.  We went over his medications.  We discussed a trial of Ranexa .  Also plan to stop Crestor  at this point.  Lipids have been quite low on Repatha , his triglycerides also look much better on Lovaza  and fenofibrate .  Physical Exam: VS:  BP (!) 140/84 (BP Location: Left Arm, Patient Position: Sitting, Cuff Size: Large)   Pulse 84   Ht 6' (1.829 m)   Wt 208 lb 12.8 oz (94.7 kg)   SpO2 98%   BMI 28.32 kg/m , BMI Body mass index is 28.32 kg/m.  Wt Readings from Last 3 Encounters:  07/11/24 208 lb 12.8 oz (94.7 kg)  12/05/23 205 lb (93 kg)  09/06/23 196 lb 3.4 oz (89 kg)    General: Patient appears comfortable at rest. HEENT: Conjunctiva and lids normal. Neck: Supple, no elevated JVP or carotid bruits. Lungs: Clear to auscultation, nonlabored breathing at rest. Cardiac: Regular rate and rhythm, no S3 or significant systolic murmur. Extremities: No pitting edema.  ECG:  An ECG dated 06/08/2023 was personally reviewed today and demonstrated:  Sinus rhythm with old inferior infarct pattern.  Labwork: 09/04/2023: ALT 36; AST 28; BUN 16; Creatinine, Ser 0.98; Hemoglobin 14.8; Platelets 142; Potassium 3.9; Sodium 136  August 2025: BUN 20, creatinine 1.26, GFR 65, potassium 4.5, AST 26, ALT 27, hemoglobin 13.4, platelets 172, cholesterol 56, triglycerides 168, HDL 37, LDL  not calculated (negative number)  Other Studies Reviewed Today:  No interval cardiac testing for review today.  Assessment and Plan:  1.  CAD status post remote stent intervention to the LAD and RCA, DES to the RCA and circumflex in 2008, DES to the LAD and RCA in 2011, DES to the circumflex and LAD in 2016, and CTO of the RCA subsequently managed medically.  Discussed his exercise plan.  He does have stable angina, mainly in the afternoons.  Plan to start Ranexa  500 mg twice daily to see how he does.  Otherwise continue aspirin  81 mg daily, Imdur  60 mg daily, Effient  10 mg daily, and as needed nitroglycerin .  2.  HFmrEF, LVEF 45 to 50% by echocardiogram in 2020.  We will discuss getting a repeat echocardiogram at his next visit.  3.  Mixed hyperlipidemia.  Recent FLP reviewed.  Plan to stop Crestor  at this time.  Continue Repatha  140 mg every 14 days, fenofibrate  160 mg daily, and Lovaza  2000 mg twice daily.  4.  Primary hypertension.  Continue Diovan  320 mg daily and Toprol -XL 75 mg daily.  Disposition:  Follow up 6 months.  Signed, Jayson JUDITHANN Sierras, M.D., F.A.C.C. [facility]

## 2024-07-11 NOTE — Patient Instructions (Signed)
 Medication Instructions:  Your physician has recommended you make the following change in your medication:   Stop Taking Crestor    Start Ranexa  500 mg Two Times Daily   *If you need a refill on your cardiac medications before your next appointment, please call your pharmacy*  Lab Work: NONE   If you have labs (blood work) drawn today and your tests are completely normal, you will receive your results only by: MyChart Message (if you have MyChart) OR A paper copy in the mail If you have any lab test that is abnormal or we need to change your treatment, we will call you to review the results.  Testing/Procedures: NONE   Follow-Up: At [facility], you and your health needs are our priority.  As part of our continuing mission to provide you with exceptional heart care, our providers are all part of one team.  This team includes your primary Cardiologist (physician) and Advanced Practice Providers or APPs (Physician Assistants and Nurse Practitioners) who all work together to provide you with the care you need, when you need it.  Your next appointment:   6 month(s)  Provider:   Jayson Sierras, MD    We recommend signing up for the patient portal called MyChart.  Sign up information is provided on this After Visit Summary.  MyChart is used to connect with patients for Virtual Visits (Telemedicine).  Patients are able to view lab/test results, encounter notes, upcoming appointments, etc.  Non-urgent messages can be sent to your provider as well.   To learn more about what you can do with MyChart, go to [email or web address].   Other Instructions Thank you for choosing [facility]!

## 2024-08-23 ENCOUNTER — Other Ambulatory Visit: Payer: Self-pay | Admitting: Cardiology

## 2024-08-27 ENCOUNTER — Other Ambulatory Visit: Payer: Self-pay | Admitting: Orthopedic Surgery

## 2024-08-29 LAB — SURGICAL PATHOLOGY

## 2024-08-31 ENCOUNTER — Other Ambulatory Visit: Payer: Self-pay | Admitting: Cardiology

## 2024-11-23 ENCOUNTER — Other Ambulatory Visit: Payer: Self-pay | Admitting: Cardiology

## 2025-01-15 ENCOUNTER — Ambulatory Visit: Admitting: Cardiology
